# Patient Record
Sex: Female | Born: 1965 | Race: White | Hispanic: No | Smoking: Never smoker
Health system: Southern US, Community
[De-identification: ages and names within clinical notes are randomized; demographics above are authoritative.]

## PROBLEM LIST (undated history)

## (undated) DIAGNOSIS — F329 Major depressive disorder, single episode, unspecified: Secondary | ICD-10-CM

## (undated) DIAGNOSIS — F419 Anxiety disorder, unspecified: Secondary | ICD-10-CM

## (undated) DIAGNOSIS — E7800 Pure hypercholesterolemia, unspecified: Secondary | ICD-10-CM

## (undated) DIAGNOSIS — F32A Depression, unspecified: Secondary | ICD-10-CM

## (undated) DIAGNOSIS — R112 Nausea with vomiting, unspecified: Secondary | ICD-10-CM

## (undated) DIAGNOSIS — G510 Bell's palsy: Secondary | ICD-10-CM

## (undated) DIAGNOSIS — C801 Malignant (primary) neoplasm, unspecified: Secondary | ICD-10-CM

## (undated) DIAGNOSIS — Z9889 Other specified postprocedural states: Secondary | ICD-10-CM

## (undated) DIAGNOSIS — M1990 Unspecified osteoarthritis, unspecified site: Secondary | ICD-10-CM

## (undated) HISTORY — PX: TONSILLECTOMY: SUR1361

## (undated) HISTORY — PX: KNEE SURGERY: SHX244

## (undated) HISTORY — PX: FOOT SURGERY: SHX648

## (undated) HISTORY — PX: OTHER SURGICAL HISTORY: SHX169

## (undated) HISTORY — PX: AUGMENTATION MAMMAPLASTY: SUR837

---

## 2007-03-10 ENCOUNTER — Ambulatory Visit (HOSPITAL_BASED_OUTPATIENT_CLINIC_OR_DEPARTMENT_OTHER): Admission: RE | Admit: 2007-03-10 | Discharge: 2007-03-10 | Payer: Self-pay | Admitting: Orthopaedic Surgery

## 2011-01-13 ENCOUNTER — Encounter: Payer: Self-pay | Admitting: Orthopaedic Surgery

## 2011-02-15 ENCOUNTER — Other Ambulatory Visit (HOSPITAL_BASED_OUTPATIENT_CLINIC_OR_DEPARTMENT_OTHER): Payer: Self-pay | Admitting: Orthopaedic Surgery

## 2011-02-15 DIAGNOSIS — M79646 Pain in unspecified finger(s): Secondary | ICD-10-CM

## 2011-02-20 ENCOUNTER — Other Ambulatory Visit (HOSPITAL_BASED_OUTPATIENT_CLINIC_OR_DEPARTMENT_OTHER): Payer: Self-pay

## 2011-02-20 ENCOUNTER — Ambulatory Visit (INDEPENDENT_AMBULATORY_CARE_PROVIDER_SITE_OTHER)
Admission: RE | Admit: 2011-02-20 | Discharge: 2011-02-20 | Disposition: A | Discharge status: Home / Self Care | Payer: BC Managed Care – PPO | Source: Ambulatory Visit | Attending: Orthopaedic Surgery | Admitting: Orthopaedic Surgery

## 2011-02-20 ENCOUNTER — Ambulatory Visit (HOSPITAL_BASED_OUTPATIENT_CLINIC_OR_DEPARTMENT_OTHER)
Admission: RE | Admit: 2011-02-20 | Discharge: 2011-02-20 | Disposition: A | Discharge status: Home / Self Care | Payer: BC Managed Care – PPO | Source: Ambulatory Visit | Attending: Orthopaedic Surgery | Admitting: Orthopaedic Surgery

## 2011-02-20 DIAGNOSIS — M25439 Effusion, unspecified wrist: Secondary | ICD-10-CM | POA: Insufficient documentation

## 2011-02-20 DIAGNOSIS — M79646 Pain in unspecified finger(s): Secondary | ICD-10-CM

## 2011-02-20 DIAGNOSIS — M25539 Pain in unspecified wrist: Secondary | ICD-10-CM | POA: Insufficient documentation

## 2011-02-20 DIAGNOSIS — M19049 Primary osteoarthritis, unspecified hand: Secondary | ICD-10-CM | POA: Insufficient documentation

## 2011-02-20 DIAGNOSIS — M25549 Pain in joints of unspecified hand: Secondary | ICD-10-CM | POA: Insufficient documentation

## 2011-02-20 DIAGNOSIS — M79609 Pain in unspecified limb: Secondary | ICD-10-CM

## 2011-03-13 ENCOUNTER — Ambulatory Visit (HOSPITAL_BASED_OUTPATIENT_CLINIC_OR_DEPARTMENT_OTHER)
Admission: RE | Admit: 2011-03-13 | Discharge: 2011-03-13 | Disposition: A | Discharge status: Home / Self Care | Payer: BC Managed Care – PPO | Source: Ambulatory Visit | Attending: Orthopedic Surgery | Admitting: Orthopedic Surgery

## 2011-03-13 DIAGNOSIS — M65839 Other synovitis and tenosynovitis, unspecified forearm: Secondary | ICD-10-CM | POA: Insufficient documentation

## 2011-03-13 DIAGNOSIS — M19049 Primary osteoarthritis, unspecified hand: Secondary | ICD-10-CM | POA: Insufficient documentation

## 2011-03-13 DIAGNOSIS — Z01812 Encounter for preprocedural laboratory examination: Secondary | ICD-10-CM | POA: Insufficient documentation

## 2011-03-13 LAB — POCT HEMOGLOBIN-HEMACUE: Hemoglobin: 13.8 g/dL (ref 12.0–15.0)

## 2011-03-20 NOTE — Op Note (Signed)
NAMENICKOLA, LENIG                ACCOUNT NO.:  [id number]  MEDICAL RECORD NO.:  [id number]           PATIENT TYPE:  LOCATION:                                 FACILITY:  PHYSICIAN:  Artist Pais. Jora Galluzzo, M.D.DATE OF BIRTH:  Sep 04, 1966  DATE OF PROCEDURE:  03/13/2011 DATE OF DISCHARGE:                              OPERATIVE REPORT   PREOPERATIVE DIAGNOSES:  Chronic left thumb carpometacarpal arthritis, left wrist stenosing tenosynovitis.  POSTOPERATIVE DIAGNOSES:  Chronic left thumb carpometacarpal arthritis, left wrist stenosing tenosynovitis.  PROCEDURE:  Left thumb carpometacarpal arthroplasty with abductor pollicis longus tendon transfer from the wrist and left wrist de Quervain release.  SURGEON:  Artist Pais. Mina Marble, MD  ASSISTANT:  None.  ANESTHESIA:  General.  TOURNIQUET TIME:  43 minutes.  COMPLICATIONS:  None.  DRAINS:  None.  The patient was taken to the operating suite after induction of adequate general anesthesia.  Left upper extremity was prepped and draped in usual sterile fashion.  An Esmarch was used to exsanguinate the limb. Tourniquet was inflated to 150 mmHg.  At this point in time, an incision was made at the [facility] joint first dorsal compartment, left thumb, and wrist area.  Skin was incised 5-6 cm.  Dissection was carried down the level of the [facility] joint, the first dorsal compartment.  The first dorsal compartment was released at the dorsal most extent releasing the APL and EPB tendons.  These were lysed of all adhesions.  The [facility] joint was carefully identified, the snuffbox artery was identified and retracted. Once this was done, a [facility] arthrotomy was performed and a subperiosteal dissection of the [facility] joint was undertaken including the thumb metacarpal base and the trapezium.  Once this was done, the trapezium was removed in piecemeal using curettes, osteotomes, and rongeurs. Complete [facility] synovectomy was then performed.  Once this was done,  a transosseous canal was made in the thumb metacarpal base exiting in the midline of the joint, 2 cm distal and dorsal using hand awl and then sequential hand drills.  The wound was thoroughly irrigated and debrided of all bone chips.  Once this was done, the abductor pollicis longus tendon was split and the radial slip of the abductor pollicis longus tendon was transected in the musculotendinous junction, drawn into the distal wound, it was passed dorsal volar out the thumb metacarpal base, wrapped around the flexor carpi radialis tendon on the appropriate tension sutured with 2-0 FiberWire suture.  The remaining tail of the abductor pollicis longus was then sutured to itself over the thumb metacarpal base dorsally Pulvertaft weave thus creating the suspension. Both these suture points were tied with 2-0 FiberWire suture.  Once this was done, the capsule overlying the [facility] joint was closed with 4-0 Vicryl and the skin with 3-0 Prolene subcuticular stitch.  Steri-Strips, 4x4s, fluffs, and a radial gutter splint was applied.  The patient tolerated the procedure well and went to the recovery room in a stable fashion.     Artist Pais Mina Marble, M.D.     MAW/MEDQ  D:  03/13/2011  T:  03/14/2011  Job:  [id number]  Electronically  Signed by Dairl Ponder M.D. on 03/20/2011 11:28:47 AM

## 2011-05-10 NOTE — Op Note (Signed)
NAMELOREY, PALLETT                ACCOUNT NO.:  [id number]   MEDICAL RECORD NO.:  [id number]          PATIENT TYPE:  AMB   LOCATION:  DSC                          FACILITY:  [facility]   PHYSICIAN:  Claude Manges. Whitfield, M.D.DATE OF BIRTH:  01-15-1966   DATE OF PROCEDURE:  03/10/2007  DATE OF DISCHARGE:                               OPERATIVE REPORT   PREOPERATIVE DIAGNOSIS:  Possible recurrent lateral patellar pressure  syndrome, left knee with possible recurrent osteocartilaginous lesion  lateral femoral condyle.   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   PROCEDURES:  1. Diagnostic arthroscopy left knee.  2. Debridement of patella, synovectomy, and lateral release.   SURGEON:  Claude Manges. Cleophas Dunker, M.D.   ANESTHESIA:  IV sedation to general oral laryngeal.   COMPLICATIONS:  None.   HISTORY:  This 45 year old female is approximately 3 years status  Carticel implantation to a cartilaginous defect of the lateral femoral  condyle of the left knee.  She has done very very well.  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  She has tried anti-  inflammatory medicines without relief.  The pain is localized along the  lateral femoral condyle.  There is a grating sensation consistent with  either chondromalacia and or synovitis.  I have tried a cortisone  injection with some relief, but not completely relief of her pain.  She  is having significant compromise.  An MRI scan was not performed but  arthroscopy was adjusted because of the significant pain and with her  past history.   DESCRIPTION OF PROCEDURE:  With the patient comfortable on the operating  table and under IV sedation, the left lower extremity was placed in a  thigh holder; and the leg was then prepped with DuraPrep from the thigh  hold to the ankle.  A sterile draping  was performed.  The two previous  arthroscopic portals on either side of the patella tendon were then  utilized.  A 15-blade knife was used to make the small incisions.  Arthroscope was then placed in the joint medially and diagnostic  arthroscopy revealed some small cartilaginous flakes within the joint.  These were removed.  There was very minimal synovitis except along the  lateral patella; and the lateral gutter.  There was an obvious patella  tilt particularly in flexion and evidence of an old scar plica from the  previous open procedure.  The previous Carticel area of implantation  appeared to be intact.  There was some fraying of the articular  cartilage, but the lesion was otherwise okay; and there no evidence of a  recurrent defect.   The Coude shaver was then introduced; and I shaved the area of  chondromalacia, the lateral patella; and then I used the ArthroCare wand  to perform a synovectomy; and then used the hooked Bovie to perform a  repeat lateral release.  She had had one over 3 years ago, but there was  some evidence of recurrence  probably based on an open procedure through  the lateral joint with recurrent scar.  At the end of the procedure,  there was definitely an elevation of the lateral patella with some  residual tilt; but, I thought, that there was significant pressure  relief from that area.  I checked to see if there was any bleeding; and  there was none.  We had a very nice release; the gutter was clear.   I also evaluated the medial and lateral compartments without evidence of  meniscal pathology or chondromalacia.  The ACL appeared to be intact.  The two puncture sites were left open, and infiltrated with 0.25%  Marcaine with epinephrine.  A sterile bulky dressing was applied.  Percocet for pain.  Office in 1 week.      Claude Manges. Cleophas Dunker, M.D.  Electronically Signed     PWW/MEDQ  D:  03/10/2007  T:  03/10/2007  Job:  [id number]

## 2011-05-29 ENCOUNTER — Emergency Department (HOSPITAL_BASED_OUTPATIENT_CLINIC_OR_DEPARTMENT_OTHER)
Admission: EM | Admit: 2011-05-29 | Discharge: 2011-05-29 | Disposition: A | Discharge status: Home / Self Care | Payer: BC Managed Care – PPO | Attending: Emergency Medicine | Admitting: Emergency Medicine

## 2011-05-29 ENCOUNTER — Emergency Department (INDEPENDENT_AMBULATORY_CARE_PROVIDER_SITE_OTHER): Payer: BC Managed Care – PPO

## 2011-05-29 DIAGNOSIS — S51809A Unspecified open wound of unspecified forearm, initial encounter: Secondary | ICD-10-CM | POA: Insufficient documentation

## 2011-05-29 DIAGNOSIS — X58XXXA Exposure to other specified factors, initial encounter: Secondary | ICD-10-CM

## 2011-05-29 DIAGNOSIS — Y92009 Unspecified place in unspecified non-institutional (private) residence as the place of occurrence of the external cause: Secondary | ICD-10-CM | POA: Insufficient documentation

## 2011-05-29 DIAGNOSIS — W268XXA Contact with other sharp object(s), not elsewhere classified, initial encounter: Secondary | ICD-10-CM | POA: Insufficient documentation

## 2011-06-13 ENCOUNTER — Emergency Department (HOSPITAL_BASED_OUTPATIENT_CLINIC_OR_DEPARTMENT_OTHER)
Admission: EM | Admit: 2011-06-13 | Discharge: 2011-06-13 | Disposition: A | Discharge status: Home / Self Care | Payer: BC Managed Care – PPO | Attending: Emergency Medicine | Admitting: Emergency Medicine

## 2011-06-13 DIAGNOSIS — Z4802 Encounter for removal of sutures: Secondary | ICD-10-CM | POA: Insufficient documentation

## 2011-11-12 ENCOUNTER — Emergency Department (HOSPITAL_BASED_OUTPATIENT_CLINIC_OR_DEPARTMENT_OTHER)
Admission: EM | Admit: 2011-11-12 | Discharge: 2011-11-12 | Disposition: A | Discharge status: Home / Self Care | Payer: BC Managed Care – PPO | Attending: Emergency Medicine | Admitting: Emergency Medicine

## 2011-11-12 ENCOUNTER — Emergency Department (INDEPENDENT_AMBULATORY_CARE_PROVIDER_SITE_OTHER): Payer: BC Managed Care – PPO

## 2011-11-12 ENCOUNTER — Encounter: Payer: Self-pay | Admitting: Emergency Medicine

## 2011-11-12 DIAGNOSIS — M79609 Pain in unspecified limb: Secondary | ICD-10-CM

## 2011-11-12 DIAGNOSIS — W2209XA Striking against other stationary object, initial encounter: Secondary | ICD-10-CM | POA: Insufficient documentation

## 2011-11-12 DIAGNOSIS — Y92009 Unspecified place in unspecified non-institutional (private) residence as the place of occurrence of the external cause: Secondary | ICD-10-CM | POA: Insufficient documentation

## 2011-11-12 DIAGNOSIS — E7800 Pure hypercholesterolemia, unspecified: Secondary | ICD-10-CM | POA: Insufficient documentation

## 2011-11-12 DIAGNOSIS — Z79899 Other long term (current) drug therapy: Secondary | ICD-10-CM | POA: Insufficient documentation

## 2011-11-12 DIAGNOSIS — S60229A Contusion of unspecified hand, initial encounter: Secondary | ICD-10-CM | POA: Insufficient documentation

## 2011-11-12 DIAGNOSIS — X58XXXA Exposure to other specified factors, initial encounter: Secondary | ICD-10-CM

## 2011-11-12 DIAGNOSIS — S60222A Contusion of left hand, initial encounter: Secondary | ICD-10-CM

## 2011-11-12 HISTORY — DX: Pure hypercholesterolemia, unspecified: E78.00

## 2011-11-12 MED ORDER — TETANUS-DIPHTH-ACELL PERTUSSIS 5-2-15.5 LF-MCG/0.5 IM SUSP: 0.5000 mL | Freq: Once | INTRAMUSCULAR | Status: DC

## 2011-11-12 MED ORDER — TETANUS-DIPHTH-ACELL PERTUSSIS 5-2.5-18.5 LF-MCG/0.5 IM SUSP
0.5000 mL | Freq: Once | INTRAMUSCULAR | Status: AC
  Administered 2011-11-12: 0.5 mL via INTRAMUSCULAR

## 2011-11-12 MED ORDER — TETANUS-DIPHTH-ACELL PERTUSSIS 5-2.5-18.5 LF-MCG/0.5 IM SUSP
INTRAMUSCULAR | Status: AC
  Administered 2011-11-12: 0.5 mL via INTRAMUSCULAR
  Filled 2011-11-12: qty 0.5

## 2011-11-12 MED ORDER — OXYCODONE-ACETAMINOPHEN 5-325 MG PO TABS: 1.0000 | ORAL_TABLET | ORAL | Status: AC | PRN

## 2011-11-12 NOTE — ED Provider Notes (Signed)
History     CSN: [id number] Arrival date & time: 11/12/2011  8:13 AM   First MD Initiated Contact with Patient 11/12/11 0757      Chief Complaint  Patient presents with  . Hand Injury    (Consider location/radiation/quality/duration/timing/severity/associated sxs/prior treatment) Patient is a 45 y.o. female presenting with hand injury. The history is provided by the patient.  Hand Injury  The incident occurred yesterday. The incident occurred at home.   patient states that her left hand was hurt using a old a ladder yesterday afternoon. She is swelling and pain on her middle finger and thumb sides. There is some bruising. No numbness or weakness. She does not know her last tetanus shot. No other injury. She states that she does go fast she has her appointment to go to.  Past Medical History  Diagnosis Date  . Hypercholesteremia     History reviewed. No pertinent past surgical history.  History reviewed. No pertinent family history.  History  Substance Use Topics  . Smoking status: Not on file  . Smokeless tobacco: Not on file  . Alcohol Use:     OB History    Grav Para Term Preterm Abortions TAB SAB Ect Mult Living                  Review of Systems  Musculoskeletal:       Left hand injury  Skin: Positive for wound.  Neurological: Negative for weakness and numbness.  Hematological: Does not bruise/bleed easily.    Allergies  Decadron  Home Medications   Current Outpatient Rx  Name Route Sig Dispense Refill  . ATORVASTATIN CALCIUM 10 MG PO TABS Oral Take 10 mg by mouth daily.      Azzie Roup ACE-ETH ESTRAD-FE 1-20 MG-MCG PO TABS Oral Take 1 tablet by mouth daily.      . SERTRALINE HCL 50 MG PO TABS Oral Take 50 mg by mouth daily.      . OXYCODONE-ACETAMINOPHEN 5-325 MG PO TABS Oral Take 1 tablet by mouth every 4 (four) hours as needed for pain. 10 tablet 0    LMP 11/04/2011  Physical Exam  Constitutional: She appears well-developed and well-nourished.    HENT:  Head: Normocephalic.  Musculoskeletal:       Some swelling and hematoma over the hypothenar area of left hand. 3 small skin disruptions. Tender over base of fifth metacarpal on the dorsal aspect. Mild swelling over thenar area of left hand. No tenderness over bone. No tenderness over snuff box.  Skin: Skin is warm.    ED Course  Procedures (including critical care time)  Labs Reviewed - No data to display Dg Hand Complete Left  11/12/2011  *RADIOLOGY REPORT*  Clinical Data: Left hand injury.  LEFT HAND - COMPLETE 3+ VIEW  Comparison: None.  Findings: No acute fracture or dislocation identified.  Mild degenerative changes are seen including ligamentous calcification versus old avulsive fracture at the base of the first metacarpal. This is likely old based on appearance.  IMPRESSION: No acute fracture identified.  Original Report Authenticated By: Reola Calkins, M.D.     1. Contusion of left hand       MDM  Patient has an injury to her left hand. Small wounds in the skin that did not need suturing. X-ray negative she'll followup with Ortho as needed.        Juliet Rude. Rubin Payor, MD 11/12/11 [phone]

## 2011-11-12 NOTE — ED Notes (Signed)
Left hand injury occurred yesterday afternoon.  Pt has large hematoma, noted three small puncture wounds.  Bleeding controlled.  Good CMS.

## 2013-03-23 ENCOUNTER — Encounter (HOSPITAL_BASED_OUTPATIENT_CLINIC_OR_DEPARTMENT_OTHER): Payer: Self-pay | Admitting: *Deleted

## 2013-03-23 ENCOUNTER — Emergency Department (HOSPITAL_BASED_OUTPATIENT_CLINIC_OR_DEPARTMENT_OTHER)
Admission: EM | Admit: 2013-03-23 | Discharge: 2013-03-23 | Disposition: A | Discharge status: Home / Self Care | Payer: BC Managed Care – PPO | Attending: Emergency Medicine | Admitting: Emergency Medicine

## 2013-03-23 DIAGNOSIS — Z79899 Other long term (current) drug therapy: Secondary | ICD-10-CM | POA: Insufficient documentation

## 2013-03-23 DIAGNOSIS — K529 Noninfective gastroenteritis and colitis, unspecified: Secondary | ICD-10-CM

## 2013-03-23 DIAGNOSIS — R6883 Chills (without fever): Secondary | ICD-10-CM | POA: Insufficient documentation

## 2013-03-23 DIAGNOSIS — E7800 Pure hypercholesterolemia, unspecified: Secondary | ICD-10-CM | POA: Insufficient documentation

## 2013-03-23 DIAGNOSIS — K5289 Other specified noninfective gastroenteritis and colitis: Secondary | ICD-10-CM | POA: Insufficient documentation

## 2013-03-23 DIAGNOSIS — R5381 Other malaise: Secondary | ICD-10-CM | POA: Insufficient documentation

## 2013-03-23 DIAGNOSIS — Z3202 Encounter for pregnancy test, result negative: Secondary | ICD-10-CM | POA: Insufficient documentation

## 2013-03-23 DIAGNOSIS — R197 Diarrhea, unspecified: Secondary | ICD-10-CM | POA: Insufficient documentation

## 2013-03-23 LAB — COMPREHENSIVE METABOLIC PANEL
ALT: 11 U/L (ref 0–35)
AST: 24 U/L (ref 0–37)
Albumin: 3.7 g/dL (ref 3.5–5.2)
Alkaline Phosphatase: 106 U/L (ref 39–117)
Calcium: 9.4 mg/dL (ref 8.4–10.5)
Potassium: 4 mEq/L (ref 3.5–5.1)
Sodium: 136 mEq/L (ref 135–145)
Total Protein: 8.3 g/dL (ref 6.0–8.3)

## 2013-03-23 LAB — URINE MICROSCOPIC-ADD ON

## 2013-03-23 LAB — URINALYSIS, ROUTINE W REFLEX MICROSCOPIC
Ketones, ur: 15 mg/dL — AB
Nitrite: NEGATIVE
Urobilinogen, UA: 0.2 mg/dL (ref 0.0–1.0)
pH: 5 (ref 5.0–8.0)

## 2013-03-23 LAB — CBC WITH DIFFERENTIAL/PLATELET
Basophils Absolute: 0 10*3/uL (ref 0.0–0.1)
Basophils Relative: 0 % (ref 0–1)
Eosinophils Absolute: 0.5 10*3/uL (ref 0.0–0.7)
Eosinophils Relative: 4 % (ref 0–5)
Lymphs Abs: 0.4 10*3/uL — ABNORMAL LOW (ref 0.7–4.0)
MCH: 33.6 pg (ref 26.0–34.0)
MCHC: 35.7 g/dL (ref 30.0–36.0)
Neutrophils Relative %: 87 % — ABNORMAL HIGH (ref 43–77)
Platelets: 252 10*3/uL (ref 150–400)
RBC: 4.85 MIL/uL (ref 3.87–5.11)
RDW: 11.8 % (ref 11.5–15.5)

## 2013-03-23 MED ORDER — SODIUM CHLORIDE 0.9 % IV BOLUS (SEPSIS)
1000.0000 mL | Freq: Once | INTRAVENOUS | Status: AC
  Administered 2013-03-23: 1000 mL via INTRAVENOUS

## 2013-03-23 MED ORDER — KETOROLAC TROMETHAMINE 30 MG/ML IJ SOLN
30.0000 mg | Freq: Once | INTRAMUSCULAR | Status: AC
  Administered 2013-03-23: 30 mg via INTRAVENOUS
  Filled 2013-03-23: qty 1

## 2013-03-23 MED ORDER — ONDANSETRON HCL 4 MG/2ML IJ SOLN
4.0000 mg | Freq: Once | INTRAMUSCULAR | Status: AC
  Administered 2013-03-23: 4 mg via INTRAVENOUS
  Filled 2013-03-23: qty 2

## 2013-03-23 MED ORDER — PROMETHAZINE HCL 25 MG RE SUPP: 25.0000 mg | Freq: Four times a day (QID) | RECTAL | Status: DC | PRN

## 2013-03-23 MED ORDER — MORPHINE SULFATE 4 MG/ML IJ SOLN
4.0000 mg | Freq: Once | INTRAMUSCULAR | Status: AC
  Administered 2013-03-23: 4 mg via INTRAVENOUS
  Filled 2013-03-23: qty 1

## 2013-03-23 NOTE — ED Provider Notes (Signed)
History     CSN: [id number]  Arrival date & time 03/23/13  1658   First MD Initiated Contact with Patient 03/23/13 1706      Chief Complaint  Patient presents with  . Emesis    (Consider location/radiation/quality/duration/timing/severity/associated sxs/prior treatment) HPI Comments: Started this morning with severe n/v/d.  All non-bloody, non-bilious.  She denies fevers or abdominal pain but feels chilled.  Daughter recently with similar complaints.    Patient is a 47 y.o. female presenting with vomiting. The history is provided by the patient.  Emesis Severity:  Severe Duration:  24 hours Timing:  Constant Quality:  Stomach contents Progression:  Worsening Chronicity:  New Recent urination:  Normal Relieved by:  Nothing Worsened by:  Nothing tried Ineffective treatments:  None tried Associated symptoms: chills and diarrhea   Associated symptoms: no abdominal pain and no fever     Past Medical History  Diagnosis Date  . Hypercholesteremia     Past Surgical History  Procedure Laterality Date  . Knee surgery    . Foot surgery      No family history on file.  History  Substance Use Topics  . Smoking status: Never Smoker   . Smokeless tobacco: Not on file  . Alcohol Use: No    OB History   Grav Para Term Preterm Abortions TAB SAB Ect Mult Living                  Review of Systems  Constitutional: Positive for chills and fatigue. Negative for fever.  Gastrointestinal: Positive for nausea, vomiting and diarrhea. Negative for abdominal pain.  All other systems reviewed and are negative.    Allergies  Decadron  Home Medications   Current Outpatient Rx  Name  Route  Sig  Dispense  Refill  . atorvastatin (LIPITOR) 10 MG tablet   Oral   Take 10 mg by mouth daily.           . norethindrone-ethinyl estradiol (JUNEL FE,GILDESS FE,LOESTRIN FE) 1-20 MG-MCG tablet   Oral   Take 1 tablet by mouth daily.           . sertraline (ZOLOFT) 50 MG tablet  Oral   Take 50 mg by mouth daily.             BP 93/60  Pulse 89  Temp(Src) 97.8 F (36.6 C) (Oral)  Resp 20  Wt 150 lb (68.04 kg)  SpO2 97%  Physical Exam  Nursing note and vitals reviewed. Constitutional: She is oriented to person, place, and time. She appears well-developed and well-nourished. No distress.  Patient appears quite anxious.  HENT:  Head: Normocephalic and atraumatic.  Mouth/Throat: Oropharynx is clear and moist.  Neck: Normal range of motion. Neck supple.  Cardiovascular: Normal rate and regular rhythm.  Exam reveals no gallop and no friction rub.   No murmur heard. Pulmonary/Chest: Effort normal and breath sounds normal. No respiratory distress. She has no wheezes.  Abdominal: Soft. Bowel sounds are normal. She exhibits no distension. There is no tenderness.  Musculoskeletal: Normal range of motion.  Neurological: She is alert and oriented to person, place, and time.  Skin: Skin is warm and dry. She is not diaphoretic.    ED Course  Procedures (including critical care time)  Labs Reviewed  PREGNANCY, URINE  URINALYSIS, ROUTINE W REFLEX MICROSCOPIC  CBC WITH DIFFERENTIAL  COMPREHENSIVE METABOLIC PANEL   No results found.   No diagnosis found.    MDM  The presentation, exam, and  workup are all consistent with a viral gastroenteritis.  The patient is feeling better after fluids and meds and appears appropriate for discharge.  To return prn if she worsens.        Geoffery Lyons, MD 03/23/13 2011

## 2013-03-23 NOTE — ED Notes (Signed)
Vomiting and diarrhea since this am

## 2014-01-27 ENCOUNTER — Emergency Department (HOSPITAL_BASED_OUTPATIENT_CLINIC_OR_DEPARTMENT_OTHER)
Admission: EM | Admit: 2014-01-27 | Discharge: 2014-01-27 | Disposition: A | Discharge status: Home / Self Care | Payer: 59 | Attending: Emergency Medicine | Admitting: Emergency Medicine

## 2014-01-27 ENCOUNTER — Encounter (HOSPITAL_BASED_OUTPATIENT_CLINIC_OR_DEPARTMENT_OTHER): Payer: Self-pay | Admitting: Emergency Medicine

## 2014-01-27 DIAGNOSIS — E7800 Pure hypercholesterolemia, unspecified: Secondary | ICD-10-CM | POA: Insufficient documentation

## 2014-01-27 DIAGNOSIS — K529 Noninfective gastroenteritis and colitis, unspecified: Secondary | ICD-10-CM

## 2014-01-27 DIAGNOSIS — K5289 Other specified noninfective gastroenteritis and colitis: Secondary | ICD-10-CM | POA: Insufficient documentation

## 2014-01-27 DIAGNOSIS — Z3202 Encounter for pregnancy test, result negative: Secondary | ICD-10-CM | POA: Insufficient documentation

## 2014-01-27 DIAGNOSIS — Z79899 Other long term (current) drug therapy: Secondary | ICD-10-CM | POA: Insufficient documentation

## 2014-01-27 LAB — COMPREHENSIVE METABOLIC PANEL
ALK PHOS: 87 U/L (ref 39–117)
ALT: 14 U/L (ref 0–35)
AST: 20 U/L (ref 0–37)
Albumin: 3.8 g/dL (ref 3.5–5.2)
BILIRUBIN TOTAL: 0.4 mg/dL (ref 0.3–1.2)
BUN: 18 mg/dL (ref 6–23)
CHLORIDE: 104 meq/L (ref 96–112)
CO2: 22 meq/L (ref 19–32)
Calcium: 9.1 mg/dL (ref 8.4–10.5)
Creatinine, Ser: 0.9 mg/dL (ref 0.50–1.10)
GFR calc Af Amer: 86 mL/min — ABNORMAL LOW (ref >90)
GFR, EST NON AFRICAN AMERICAN: 74 mL/min — AB (ref >90)
Glucose, Bld: 91 mg/dL (ref 70–99)
POTASSIUM: 4 meq/L (ref 3.7–5.3)
SODIUM: 141 meq/L (ref 137–147)
Total Protein: 7.6 g/dL (ref 6.0–8.3)

## 2014-01-27 LAB — CBC WITH DIFFERENTIAL/PLATELET
BASOS ABS: 0 10*3/uL (ref 0.0–0.1)
Basophils Relative: 0 % (ref 0–1)
Eosinophils Absolute: 1 10*3/uL — ABNORMAL HIGH (ref 0.0–0.7)
Eosinophils Relative: 9 % — ABNORMAL HIGH (ref 0–5)
HEMATOCRIT: 43.5 % (ref 36.0–46.0)
Hemoglobin: 15.3 g/dL — ABNORMAL HIGH (ref 12.0–15.0)
LYMPHS PCT: 6 % — AB (ref 12–46)
Lymphs Abs: 0.6 10*3/uL — ABNORMAL LOW (ref 0.7–4.0)
MCH: 34.5 pg — ABNORMAL HIGH (ref 26.0–34.0)
MCHC: 35.2 g/dL (ref 30.0–36.0)
MCV: 98.2 fL (ref 78.0–100.0)
MONO ABS: 0.8 10*3/uL (ref 0.1–1.0)
Monocytes Relative: 7 % (ref 3–12)
NEUTROS ABS: 8.2 10*3/uL — AB (ref 1.7–7.7)
Neutrophils Relative %: 77 % (ref 43–77)
PLATELETS: 256 10*3/uL (ref 150–400)
RBC: 4.43 MIL/uL (ref 3.87–5.11)
RDW: 11 % — AB (ref 11.5–15.5)
WBC: 10.6 10*3/uL — AB (ref 4.0–10.5)

## 2014-01-27 LAB — URINALYSIS, ROUTINE W REFLEX MICROSCOPIC
Bilirubin Urine: NEGATIVE
Glucose, UA: NEGATIVE mg/dL
Ketones, ur: NEGATIVE mg/dL
Leukocytes, UA: NEGATIVE
Nitrite: NEGATIVE
Protein, ur: NEGATIVE mg/dL
SPECIFIC GRAVITY, URINE: 1.03 (ref 1.005–1.030)
UROBILINOGEN UA: 0.2 mg/dL (ref 0.0–1.0)
pH: 5.5 (ref 5.0–8.0)

## 2014-01-27 LAB — URINE MICROSCOPIC-ADD ON

## 2014-01-27 LAB — PREGNANCY, URINE: PREG TEST UR: NEGATIVE

## 2014-01-27 MED ORDER — SODIUM CHLORIDE 0.9 % IV BOLUS (SEPSIS)
1000.0000 mL | Freq: Once | INTRAVENOUS | Status: AC
  Administered 2014-01-27: 1000 mL via INTRAVENOUS

## 2014-01-27 MED ORDER — ONDANSETRON HCL 4 MG PO TABS: 4.0000 mg | ORAL_TABLET | Freq: Three times a day (TID) | ORAL | Status: DC | PRN

## 2014-01-27 MED ORDER — OXYCODONE-ACETAMINOPHEN 5-325 MG PO TABS
2.0000 | ORAL_TABLET | Freq: Once | ORAL | Status: AC
  Administered 2014-01-27: 2 via ORAL
  Filled 2014-01-27: qty 2

## 2014-01-27 MED ORDER — ONDANSETRON HCL 4 MG/2ML IJ SOLN
4.0000 mg | Freq: Once | INTRAMUSCULAR | Status: AC
  Administered 2014-01-27: 4 mg via INTRAVENOUS
  Filled 2014-01-27: qty 2

## 2014-01-27 MED ORDER — DICYCLOMINE HCL 10 MG PO CAPS
10.0000 mg | ORAL_CAPSULE | Freq: Once | ORAL | Status: AC
  Administered 2014-01-27: 10 mg via ORAL
  Filled 2014-01-27: qty 1

## 2014-01-27 MED ORDER — LOPERAMIDE HCL 2 MG PO CAPS
4.0000 mg | ORAL_CAPSULE | Freq: Once | ORAL | Status: AC
  Administered 2014-01-27: 4 mg via ORAL
  Filled 2014-01-27: qty 2

## 2014-01-27 NOTE — ED Notes (Signed)
Pt c/o n/v/d x 3 hrs

## 2014-01-27 NOTE — ED Provider Notes (Signed)
CSN: [id number]     Arrival date & time 01/27/14  2103 History  This chart was scribed for Charles B. Karle Starch, MD by Roe Coombs, ED Scribe. The patient was seen in room MH08/MH08. Patient's care was started at 9:18 PM.    Chief Complaint  Patient presents with  . Emesis    The history is provided by the patient. No language interpreter was used.    HPI Comments: [name] [name] is a 48 y.o. female with history of hypercholesterolemia who presents to the Emergency Department complaining of continuous episodes of non-bloody emesis of stomach contents and non-bloody diarrhea with associated nausea and upper abdominal pain onset 3 hours ago. She states that her 61 year old daughter has been sick with viral gastroenteritis for the past four days and was seen here last night for this. She has no abdominal surgical history. Patient does not smoke.   Past Medical History  Diagnosis Date  . Hypercholesteremia    Past Surgical History  Procedure Laterality Date  . Knee surgery    . Foot surgery     History reviewed. No pertinent family history. History  Substance Use Topics  . Smoking status: Never Smoker   . Smokeless tobacco: Not on file  . Alcohol Use: No   OB History   Grav Para Term Preterm Abortions TAB SAB Ect Mult Living                 Review of Systems A complete 10 system review of systems was obtained and all systems are negative except as noted in the HPI and PMH.    Allergies  Decadron  Home Medications   Current Outpatient Rx  Name  Route  Sig  Dispense  Refill  . atorvastatin (LIPITOR) 10 MG tablet   Oral   Take 10 mg by mouth daily.           . norethindrone-ethinyl estradiol (JUNEL FE,GILDESS FE,LOESTRIN FE) 1-20 MG-MCG tablet   Oral   Take 1 tablet by mouth daily.           . promethazine (PHENERGAN) 25 MG suppository   Rectal   Place 1 suppository (25 mg total) rectally every 6 (six) hours as needed for nausea.   12 each   0   . sertraline  (ZOLOFT) 50 MG tablet   Oral   Take 50 mg by mouth daily.            Triage Vitals: BP 101/67  Pulse 89  Temp(Src) 98.5 F (36.9 C)  Resp 16  Ht 5\' 5"  (1.651 m)  Wt 145 lb (65.772 kg)  BMI 24.13 kg/m2  SpO2 98% Physical Exam  Nursing note and vitals reviewed. Constitutional: She is oriented to person, place, and time. She appears well-developed and well-nourished.  HENT:  Head: Normocephalic and atraumatic.  Eyes: EOM are normal. Pupils are equal, round, and reactive to light.  Neck: Normal range of motion. Neck supple.  Cardiovascular: Normal rate, normal heart sounds and intact distal pulses.   Pulmonary/Chest: Effort normal and breath sounds normal.  Abdominal: Bowel sounds are normal. She exhibits no distension. There is no tenderness.  Musculoskeletal: Normal range of motion. She exhibits no edema and no tenderness.  Neurological: She is alert and oriented to person, place, and time. She has normal strength. No cranial nerve deficit or sensory deficit.  Skin: Skin is warm and dry. No rash noted.  Psychiatric: She has a normal mood and affect.    ED Course  Procedures (including critical care time) DIAGNOSTIC STUDIES: Oxygen Saturation is 98% on room air, normal by my interpretation.    COORDINATION OF CARE: 9:27 PM- Patient informed of current plan for treatment and evaluation and agrees with plan at this time.     Labs Review Labs Reviewed  URINALYSIS, ROUTINE W REFLEX MICROSCOPIC - Abnormal; Notable for the following:    APPearance CLOUDY (*)    Hgb urine dipstick LARGE (*)    All other components within normal limits  CBC WITH DIFFERENTIAL - Abnormal; Notable for the following:    WBC 10.6 (*)    Hemoglobin 15.3 (*)    MCH 34.5 (*)    RDW 11.0 (*)    Neutro Abs 8.2 (*)    Lymphocytes Relative 6 (*)    Lymphs Abs 0.6 (*)    Eosinophils Relative 9 (*)    Eosinophils Absolute 1.0 (*)    All other components within normal limits  COMPREHENSIVE METABOLIC  PANEL - Abnormal; Notable for the following:    GFR calc non Af Amer 74 (*)    GFR calc Af Amer 86 (*)    All other components within normal limits  URINE MICROSCOPIC-ADD ON - Abnormal; Notable for the following:    Squamous Epithelial / LPF FEW (*)    All other components within normal limits  PREGNANCY, URINE    Imaging Review No results found.  EKG Interpretation   None       MDM   1. Gastroenteritis     Labs reviewed and unremarkable. Pt continues to have some diarrhea but no further vomiting. Given pain meds, bentyl and imodium in the ED as well as second liter IVF.   I personally performed the services described in this documentation, which was scribed in my presence. The recorded information has been reviewed and is accurate.      Charles B. Karle Starch, MD 01/27/14 2259

## 2014-01-27 NOTE — Discharge Instructions (Signed)
Viral Gastroenteritis Viral gastroenteritis is also known as stomach flu. This condition affects the stomach and intestinal tract. It can cause sudden diarrhea and vomiting. The illness typically lasts 3 to 8 days. Most people develop an immune response that eventually gets rid of the virus. While this natural response develops, the virus can make you quite ill. CAUSES  Many different viruses can cause gastroenteritis, such as rotavirus or noroviruses. You can catch one of these viruses by consuming contaminated food or water. You may also catch a virus by sharing utensils or other personal items with an infected person or by touching a contaminated surface. SYMPTOMS  The most common symptoms are diarrhea and vomiting. These problems can cause a severe loss of body fluids (dehydration) and a body salt (electrolyte) imbalance. Other symptoms may include:  Fever.  Headache.  Fatigue.  Abdominal pain. DIAGNOSIS  Your caregiver can usually diagnose viral gastroenteritis based on your symptoms and a physical exam. A stool sample may also be taken to test for the presence of viruses or other infections. TREATMENT  This illness typically goes away on its own. Treatments are aimed at rehydration. The most serious cases of viral gastroenteritis involve vomiting so severely that you are not able to keep fluids down. In these cases, fluids must be given through an intravenous line (IV). HOME CARE INSTRUCTIONS   Drink enough fluids to keep your urine clear or pale yellow. Drink small amounts of fluids frequently and increase the amounts as tolerated.  Ask your caregiver for specific rehydration instructions.  Avoid:  Foods high in sugar.  Alcohol.  Carbonated drinks.  Tobacco.  Juice.  Caffeine drinks.  Extremely hot or cold fluids.  Fatty, greasy foods.  Too much intake of anything at one time.  Dairy products until 24 to 48 hours after diarrhea stops.  You may consume probiotics.  Probiotics are active cultures of beneficial bacteria. They may lessen the amount and number of diarrheal stools in adults. Probiotics can be found in yogurt with active cultures and in supplements.  Wash your hands well to avoid spreading the virus.  Only take over-the-counter or prescription medicines for pain, discomfort, or fever as directed by your caregiver. Do not give aspirin to children. Antidiarrheal medicines are not recommended.  Ask your caregiver if you should continue to take your regular prescribed and over-the-counter medicines.  Keep all follow-up appointments as directed by your caregiver. SEEK IMMEDIATE MEDICAL CARE IF:   You are unable to keep fluids down.  You do not urinate at least once every 6 to 8 hours.  You develop shortness of breath.  You notice blood in your stool or vomit. This may look like coffee grounds.  You have abdominal pain that increases or is concentrated in one small area (localized).  You have persistent vomiting or diarrhea.  You have a fever.  The patient is a child younger than 3 months, and he or she has a fever.  The patient is a child older than 3 months, and he or she has a fever and persistent symptoms.  The patient is a child older than 3 months, and he or she has a fever and symptoms suddenly get worse.  The patient is a baby, and he or she has no tears when crying. MAKE SURE YOU:   Understand these instructions.  Will watch your condition.  Will get help right away if you are not doing well or get worse. Document Released: 12/09/2005 Document Revised: 03/02/2012 Document Reviewed: 09/25/2011   [employer] Patient Information 2014 [employer]  

## 2014-09-16 NOTE — H&P (Signed)
[name] Fears, MD   Biagio Borg, PA-C [address], [place], [place]  [address]                             [phone]   ORTHOPAEDIC HISTORY & PHYSICAL  [name] [name] MRN:  [id number] DOB/SEX:  September 30, 1966/female  CHIEF COMPLAINT:  Painful right heel pain   HISTORY:[name] [name] has been having persistent problems with plantar fasciitis of her right heel.  [name] [name] has had a successful release of the plantar fascia on her left heel, although it took a long time to heal.  [name] [name] has been working out in an attempt to lose weight and get into "getter shape" and notes that [name] [name] has been having more pain in the  plantar aspect of her right heel.  [name] [name] did have an injection in that area about 2 months ago.  [name] [name] notes [name] [name] has gained some weight over time and is in the midst of trying to lose that    PAST MEDICAL HISTORY: There are no active problems to display for this patient.  Past Medical History  Diagnosis Date  . Hypercholesteremia   . Depression   . PONV (postoperative nausea and vomiting)     would like scope patch   Past Surgical History  Procedure Laterality Date  . Knee surgery    . Foot surgery    . Tonsillectomy    . Tummy tuck      2010     MEDICATIONS:   Prescriptions prior to admission  Medication Sig Dispense Refill  . atorvastatin (LIPITOR) 10 MG tablet Take 10 mg by mouth daily.        Marland Kitchen ibuprofen (ADVIL,MOTRIN) 800 MG tablet Take 800 mg by mouth every 8 (eight) hours as needed.      . sertraline (ZOLOFT) 50 MG tablet Take 50 mg by mouth daily.         ALLERGIES:   Allergies  Allergen Reactions  . Decadron [Dexamethasone Sodium Phosphate] Anaphylaxis    REVIEW OF SYSTEMS:  A comprehensive review of systems was negative except for: Behavioral/Psych: positive for depression   FAMILY HISTORY:  History reviewed. No pertinent family history.  SOCIAL HISTORY:   History  Substance Use Topics  . Smoking status: Never Smoker   . Smokeless tobacco: Not on file   . Alcohol Use: No      EXAMINATION: Vital signs in last 24 hours: Temp:  [98.6 F (37 C)] 98.6 F (37 C) (10/06 1219) Pulse Rate:  [53] 53 (10/06 1219) Resp:  [18] 18 (10/06 1219) BP: (110)/(62) 110/62 mmHg (10/06 1219) SpO2:  [99 %] 99 % (10/06 1219) Weight:  [63.504 kg (140 lb)] 63.504 kg (140 lb) (10/06 1219)  Head is normocephalic.   Eyes:  Pupils equal, round and reactive to light and accommodation.  Extraocular intact. ENT: Ears, nose, and throat were benign.   Neck: supple, no bruits were noted.   Chest: good expansion.   Lungs: essentially clear.   Cardiac: regular rhythm and rate, normal S1, S2.  No murmurs appreciated. Pulses :  2+ bilateral and symmetric in lower extremities. Abdomen is scaphoid, soft, nontender, no masses palpable, normal bowel sounds present. CNS:  [name] [name] is oriented x3 and cranial nerves II-XII grossly intact. Breast, rectal, and genital exams: not performed and not indicated for an orthopedic evaluation. Musculoskeletal: [name] [name] was tender directly over the plantar fascial attachment at the medial aspect of her right heel.  [name] [name] does have a little bit of a limp   Imaging Review X-rays today, 2-view, of the right calcaneus reveals a plantar calcaneal spur.  [name] [name] also has a little bit of spurring over the talar neck, which may cause some impingement to the tibia.      ASSESSMENT: right plantar faciitis  Past Medical History  Diagnosis Date  . Hypercholesteremia   . Depression   . PONV (postoperative nausea and vomiting)     would like scope patch    PLAN: Plan for right plantar fascial release  The procedure,  risks, and benefits of surgery were presented and reviewed. The risks including but not limited to infection, blood clots, vascular and nerve injury, stiffness,  among others were discussed. The patient acknowledged the explanation, agreed to proceed.   Mike Craze [place], [place] [phone]  09/27/2014 12:32  PM

## 2014-09-22 ENCOUNTER — Encounter (HOSPITAL_BASED_OUTPATIENT_CLINIC_OR_DEPARTMENT_OTHER): Payer: Self-pay | Admitting: *Deleted

## 2014-09-27 ENCOUNTER — Encounter (HOSPITAL_BASED_OUTPATIENT_CLINIC_OR_DEPARTMENT_OTHER)
Admission: RE | Disposition: A | Discharge status: Home / Self Care | Payer: Self-pay | Source: Ambulatory Visit | Attending: Orthopaedic Surgery

## 2014-09-27 ENCOUNTER — Ambulatory Visit (HOSPITAL_BASED_OUTPATIENT_CLINIC_OR_DEPARTMENT_OTHER)
Admission: RE | Admit: 2014-09-27 | Discharge: 2014-09-27 | Disposition: A | Discharge status: Home / Self Care | Payer: 59 | Source: Ambulatory Visit | Attending: Orthopaedic Surgery | Admitting: Orthopaedic Surgery

## 2014-09-27 ENCOUNTER — Encounter (HOSPITAL_BASED_OUTPATIENT_CLINIC_OR_DEPARTMENT_OTHER): Payer: Self-pay | Admitting: Certified Registered"

## 2014-09-27 ENCOUNTER — Ambulatory Visit (HOSPITAL_BASED_OUTPATIENT_CLINIC_OR_DEPARTMENT_OTHER): Payer: 59 | Admitting: Certified Registered"

## 2014-09-27 ENCOUNTER — Encounter (HOSPITAL_BASED_OUTPATIENT_CLINIC_OR_DEPARTMENT_OTHER): Payer: 59 | Admitting: Certified Registered"

## 2014-09-27 DIAGNOSIS — F329 Major depressive disorder, single episode, unspecified: Secondary | ICD-10-CM | POA: Diagnosis not present

## 2014-09-27 DIAGNOSIS — Z888 Allergy status to other drugs, medicaments and biological substances status: Secondary | ICD-10-CM | POA: Insufficient documentation

## 2014-09-27 DIAGNOSIS — E780 Pure hypercholesterolemia: Secondary | ICD-10-CM | POA: Insufficient documentation

## 2014-09-27 DIAGNOSIS — Z79899 Other long term (current) drug therapy: Secondary | ICD-10-CM | POA: Diagnosis not present

## 2014-09-27 DIAGNOSIS — M25774 Osteophyte, right foot: Secondary | ICD-10-CM | POA: Diagnosis not present

## 2014-09-27 DIAGNOSIS — M722 Plantar fascial fibromatosis: Secondary | ICD-10-CM

## 2014-09-27 HISTORY — DX: Major depressive disorder, single episode, unspecified: F32.9

## 2014-09-27 HISTORY — DX: Depression, unspecified: F32.A

## 2014-09-27 HISTORY — PX: PLANTAR FASCIA RELEASE: SHX2239

## 2014-09-27 HISTORY — DX: Nausea with vomiting, unspecified: R11.2

## 2014-09-27 HISTORY — DX: Other specified postprocedural states: Z98.890

## 2014-09-27 LAB — POCT HEMOGLOBIN-HEMACUE: Hemoglobin: 14.4 g/dL (ref 12.0–15.0)

## 2014-09-27 SURGERY — RELEASE, FASCIA, PLANTAR
Anesthesia: General | Site: Foot | Laterality: Right

## 2014-09-27 MED ORDER — OXYCODONE HCL 5 MG PO TABS
5.0000 mg | ORAL_TABLET | Freq: Once | ORAL | Status: AC | PRN
Start: 2014-09-27 — End: 2014-09-27
  Administered 2014-09-27: 5 mg via ORAL

## 2014-09-27 MED ORDER — BUPIVACAINE-EPINEPHRINE (PF) 0.25% -1:200000 IJ SOLN
INTRAMUSCULAR | Status: AC
  Filled 2014-09-27: qty 30

## 2014-09-27 MED ORDER — SCOPOLAMINE 1 MG/3DAYS TD PT72
MEDICATED_PATCH | TRANSDERMAL | Status: AC
  Filled 2014-09-27: qty 1

## 2014-09-27 MED ORDER — LIDOCAINE HCL (CARDIAC) 20 MG/ML IV SOLN
INTRAVENOUS | Status: DC | PRN
  Administered 2014-09-27: 60 mg via INTRAVENOUS

## 2014-09-27 MED ORDER — MIDAZOLAM HCL 2 MG/2ML IJ SOLN
INTRAMUSCULAR | Status: AC
  Filled 2014-09-27: qty 2

## 2014-09-27 MED ORDER — CHLORHEXIDINE GLUCONATE 4 % EX LIQD: 60.0000 mL | Freq: Once | CUTANEOUS | Status: DC

## 2014-09-27 MED ORDER — OXYCODONE HCL 5 MG/5ML PO SOLN: 5.0000 mg | Freq: Once | ORAL | Status: AC | PRN

## 2014-09-27 MED ORDER — LACTATED RINGERS IV SOLN
INTRAVENOUS | Status: DC
  Administered 2014-09-27 (×2): via INTRAVENOUS

## 2014-09-27 MED ORDER — ACETAMINOPHEN 10 MG/ML IV SOLN
1000.0000 mg | Freq: Four times a day (QID) | INTRAVENOUS | Status: DC
  Administered 2014-09-27: 1000 mg via INTRAVENOUS

## 2014-09-27 MED ORDER — SCOPOLAMINE 1 MG/3DAYS TD PT72: 1.0000 | MEDICATED_PATCH | TRANSDERMAL | Status: DC

## 2014-09-27 MED ORDER — HYDROMORPHONE HCL 1 MG/ML IJ SOLN
INTRAMUSCULAR | Status: AC
  Filled 2014-09-27: qty 1

## 2014-09-27 MED ORDER — MIDAZOLAM HCL 2 MG/2ML IJ SOLN: 1.0000 mg | INTRAMUSCULAR | Status: DC | PRN

## 2014-09-27 MED ORDER — PROPOFOL 10 MG/ML IV BOLUS
INTRAVENOUS | Status: DC | PRN
  Administered 2014-09-27: 200 mg via INTRAVENOUS

## 2014-09-27 MED ORDER — ACETAMINOPHEN 10 MG/ML IV SOLN
INTRAVENOUS | Status: AC
  Filled 2014-09-27: qty 100

## 2014-09-27 MED ORDER — OXYCODONE HCL 5 MG PO TABS
ORAL_TABLET | ORAL | Status: AC
  Filled 2014-09-27: qty 1

## 2014-09-27 MED ORDER — CEFAZOLIN SODIUM-DEXTROSE 2-3 GM-% IV SOLR
INTRAVENOUS | Status: AC
  Filled 2014-09-27: qty 50

## 2014-09-27 MED ORDER — FENTANYL CITRATE 0.05 MG/ML IJ SOLN
INTRAMUSCULAR | Status: DC | PRN
  Administered 2014-09-27: 25 ug via INTRAVENOUS
  Administered 2014-09-27: 50 ug via INTRAVENOUS
  Administered 2014-09-27: 25 ug via INTRAVENOUS

## 2014-09-27 MED ORDER — MIDAZOLAM HCL 5 MG/5ML IJ SOLN
INTRAMUSCULAR | Status: DC | PRN
  Administered 2014-09-27: 2 mg via INTRAVENOUS

## 2014-09-27 MED ORDER — ONDANSETRON HCL 4 MG/2ML IJ SOLN
INTRAMUSCULAR | Status: DC | PRN
  Administered 2014-09-27: 4 mg via INTRAVENOUS

## 2014-09-27 MED ORDER — FENTANYL CITRATE 0.05 MG/ML IJ SOLN: 50.0000 ug | INTRAMUSCULAR | Status: DC | PRN

## 2014-09-27 MED ORDER — CEFAZOLIN SODIUM-DEXTROSE 2-3 GM-% IV SOLR
2.0000 g | INTRAVENOUS | Status: AC
  Administered 2014-09-27: 2 g via INTRAVENOUS

## 2014-09-27 MED ORDER — FENTANYL CITRATE 0.05 MG/ML IJ SOLN
INTRAMUSCULAR | Status: AC
  Filled 2014-09-27: qty 6

## 2014-09-27 MED ORDER — SODIUM CHLORIDE 0.9 % IV SOLN: INTRAVENOUS | Status: DC

## 2014-09-27 MED ORDER — PROMETHAZINE HCL 25 MG/ML IJ SOLN: 6.2500 mg | INTRAMUSCULAR | Status: DC | PRN

## 2014-09-27 MED ORDER — EPHEDRINE SULFATE 50 MG/ML IJ SOLN
INTRAMUSCULAR | Status: DC | PRN
  Administered 2014-09-27: 10 mg via INTRAVENOUS

## 2014-09-27 MED ORDER — OXYCODONE-ACETAMINOPHEN 5-325 MG PO TABS: 1.0000 | ORAL_TABLET | ORAL | Status: DC | PRN

## 2014-09-27 MED ORDER — BUPIVACAINE-EPINEPHRINE 0.25% -1:200000 IJ SOLN
INTRAMUSCULAR | Status: DC | PRN
  Administered 2014-09-27: 10 mL
  Administered 2014-09-27: 8 mL

## 2014-09-27 MED ORDER — HYDROMORPHONE HCL 1 MG/ML IJ SOLN
0.2500 mg | INTRAMUSCULAR | Status: DC | PRN
  Administered 2014-09-27 (×4): 0.5 mg via INTRAVENOUS

## 2014-09-27 SURGICAL SUPPLY — 65 items
APL SKNCLS STERI-STRIP NONHPOA (GAUZE/BANDAGES/DRESSINGS) ×1
BAG DECANTER FOR FLEXI CONT (MISCELLANEOUS) ×1 IMPLANT
BANDAGE ELASTIC 4 VELCRO ST LF (GAUZE/BANDAGES/DRESSINGS) ×2 IMPLANT
BANDAGE ELASTIC 6 VELCRO ST LF (GAUZE/BANDAGES/DRESSINGS) ×3 IMPLANT
BENZOIN TINCTURE PRP APPL 2/3 (GAUZE/BANDAGES/DRESSINGS) ×2 IMPLANT
BLADE SURG 15 STRL LF DISP TIS (BLADE) ×1 IMPLANT
BLADE SURG 15 STRL SS (BLADE) ×3
BNDG CMPR 9X4 STRL LF SNTH (GAUZE/BANDAGES/DRESSINGS) ×1
BNDG COHESIVE 3X5 TAN STRL LF (GAUZE/BANDAGES/DRESSINGS) IMPLANT
BNDG ESMARK 4X9 LF (GAUZE/BANDAGES/DRESSINGS) ×3 IMPLANT
BNDG GAUZE ELAST 4 BULKY (GAUZE/BANDAGES/DRESSINGS) ×6 IMPLANT
CLOSURE WOUND 1/2 X4 (GAUZE/BANDAGES/DRESSINGS) ×1
COVER MAYO STAND STRL (DRAPES) ×3 IMPLANT
COVER TABLE BACK 60X90 (DRAPES) ×3 IMPLANT
DRAPE EXTREMITY TIBURON (DRAPES) ×3 IMPLANT
DRAPE INCISE IOBAN 66X45 STRL (DRAPES) IMPLANT
DRAPE OEC MINIVIEW 54X84 (DRAPES) ×1 IMPLANT
DRAPE U 20/CS (DRAPES) IMPLANT
DRSG EMULSION OIL 3X3 NADH (GAUZE/BANDAGES/DRESSINGS) ×1 IMPLANT
DRSG PAD ABDOMINAL 8X10 ST (GAUZE/BANDAGES/DRESSINGS) IMPLANT
DURAPREP 26ML APPLICATOR (WOUND CARE) ×3 IMPLANT
GAUZE SPONGE 4X4 12PLY STRL (GAUZE/BANDAGES/DRESSINGS) ×2 IMPLANT
GAUZE XEROFORM 1X8 LF (GAUZE/BANDAGES/DRESSINGS) ×2 IMPLANT
GLOVE BIO SURGEON STRL SZ7.5 (GLOVE) IMPLANT
GLOVE BIOGEL PI IND STRL 7.0 (GLOVE) IMPLANT
GLOVE BIOGEL PI IND STRL 8 (GLOVE) ×1 IMPLANT
GLOVE BIOGEL PI IND STRL 8.5 (GLOVE) IMPLANT
GLOVE BIOGEL PI INDICATOR 7.0 (GLOVE) ×2
GLOVE BIOGEL PI INDICATOR 8 (GLOVE) ×2
GLOVE BIOGEL PI INDICATOR 8.5 (GLOVE) ×2
GLOVE ECLIPSE 6.5 STRL STRAW (GLOVE) ×2 IMPLANT
GLOVE ECLIPSE 8.0 STRL XLNG CF (GLOVE) ×3 IMPLANT
GOWN STRL REUS W/ TWL LRG LVL3 (GOWN DISPOSABLE) ×1 IMPLANT
GOWN STRL REUS W/ TWL XL LVL3 (GOWN DISPOSABLE) ×1 IMPLANT
GOWN STRL REUS W/TWL LRG LVL3 (GOWN DISPOSABLE) ×6
GOWN STRL REUS W/TWL XL LVL3 (GOWN DISPOSABLE) ×3
NEEDLE HYPO 22GX1.5 SAFETY (NEEDLE) ×3 IMPLANT
NS IRRIG 1000ML POUR BTL (IV SOLUTION) ×2 IMPLANT
PACK BASIN DAY SURGERY FS (CUSTOM PROCEDURE TRAY) ×3 IMPLANT
PAD CAST 4YDX4 CTTN HI CHSV (CAST SUPPLIES) ×2 IMPLANT
PADDING CAST ABS 4INX4YD NS (CAST SUPPLIES)
PADDING CAST ABS COTTON 4X4 ST (CAST SUPPLIES) IMPLANT
PADDING CAST COTTON 4X4 STRL (CAST SUPPLIES) ×6
PENCIL BUTTON HOLSTER BLD 10FT (ELECTRODE) ×3 IMPLANT
SHEET MEDIUM DRAPE 40X70 STRL (DRAPES) ×3 IMPLANT
SPLINT FAST PLASTER 5X30 (CAST SUPPLIES) ×30
SPLINT PLASTER CAST FAST 5X30 (CAST SUPPLIES) IMPLANT
SPONGE LAP 4X18 X RAY DECT (DISPOSABLE) IMPLANT
STAPLER VISISTAT 35W (STAPLE) IMPLANT
STOCKINETTE 6  STRL (DRAPES) ×2
STOCKINETTE 6 STRL (DRAPES) ×1 IMPLANT
STRIP CLOSURE SKIN 1/2X4 (GAUZE/BANDAGES/DRESSINGS) ×1 IMPLANT
SUCTION FRAZIER TIP 10 FR DISP (SUCTIONS) ×1 IMPLANT
SUT ETHILON 4 0 PS 2 18 (SUTURE) ×2 IMPLANT
SUT MNCRL AB 3-0 PS2 18 (SUTURE) ×2 IMPLANT
SUT PROLENE 3 0 PS 2 (SUTURE) IMPLANT
SUT VIC AB 2-0 SH 27 (SUTURE)
SUT VIC AB 2-0 SH 27XBRD (SUTURE) IMPLANT
SUT VICRYL 4-0 PS2 18IN ABS (SUTURE) IMPLANT
SYR 20CC LL (SYRINGE) ×1 IMPLANT
SYR BULB 3OZ (MISCELLANEOUS) ×2 IMPLANT
SYR CONTROL 10ML LL (SYRINGE) ×2 IMPLANT
TUBE CONNECTING 20'X1/4 (TUBING)
TUBE CONNECTING 20X1/4 (TUBING) ×1 IMPLANT
UNDERPAD 30X30 INCONTINENT (UNDERPADS AND DIAPERS) ×1 IMPLANT

## 2014-09-27 NOTE — Transfer of Care (Signed)
Immediate Anesthesia Transfer of Care Note  Patient: [name] [name]  Procedure(s) Performed: Procedure(s): PLANTAR FASCIA RELEASE (Right)  Patient Location: PACU  Anesthesia Type:General  Level of Consciousness: awake and patient cooperative  Airway & Oxygen Therapy: Patient Spontanous Breathing and Patient connected to face mask oxygen  Post-op Assessment: Report given to PACU RN and Post -op Vital signs reviewed and stable  Post vital signs: Reviewed and stable  Complications: No apparent anesthesia complications

## 2014-09-27 NOTE — H&P (Signed)
  The recent History & Physical has been reviewed. I have personally examined the patient today. There is no interval change to the documented History & Physical. The patient would like to proceed with the procedure.  [name] W 09/27/2014,  1:06 PM

## 2014-09-27 NOTE — Discharge Instructions (Addendum)
Call your surgeon if you experience:  °1.  Fever over 101.0. °2.  Inability to urinate. °3.  Nausea and/or vomiting. °4.  Extreme swelling or bruising at the surgical site. °5.  Continued bleeding from the incision. °6.  Increased pain, redness or drainage from the incision. °7.  Problems related to your pain medication. °8. Any change in color, movement and/or sensation °9. Any problems and/or concerns ° ° °Post Anesthesia Home Care Instructions °Activity: °Get plenty of rest for the remainder of the day. A responsible adult should stay with you for 24 hours following the procedure.  °For the next 24 hours, DO NOT: °-Drive a car °-Operate machinery °-Drink alcoholic beverages °-Take any medication unless instructed by your physician °-Make any legal decisions or sign important papers. ° °Meals: °Start with liquid foods such as gelatin or soup. Progress to regular foods as tolerated. Avoid greasy, spicy, heavy foods. If nausea and/or vomiting occur, drink only clear liquids until the nausea and/or vomiting subsides. Call your physician if vomiting continues. ° °Special Instructions/Symptoms: °Your throat may feel dry or sore from the anesthesia or the breathing tube placed in your throat during surgery. If this causes discomfort, gargle with warm salt water. The discomfort should disappear within 24 hours. ° °

## 2014-09-27 NOTE — Anesthesia Postprocedure Evaluation (Signed)
Anesthesia Post Note  Patient: [name] [name]  Procedure(s) Performed: Procedure(s) (LRB): PLANTAR FASCIA RELEASE (Right)  Anesthesia type: general  Patient location: PACU  Post pain: Pain level controlled  Post assessment: Patient's Cardiovascular Status Stable  Last Vitals:  Filed Vitals:   09/27/14 1530  BP: 110/69  Pulse: 65  Temp:   Resp: 13    Post vital signs: Reviewed and stable  Level of consciousness: sedated  Complications: No apparent anesthesia complications

## 2014-09-27 NOTE — Op Note (Signed)
PATIENT ID:      Kamron Portee  MRN:     [id number] DOB/AGE:    April 17, 1966 / 48 y.o.       OPERATIVE REPORT    DATE OF PROCEDURE:  09/27/2014       PREOPERATIVE DIAGNOSIS:   RIGHT PLANTAR FASCIITIS                                                                   Estimated body mass index is 23.3 kg/(m^2) as calculated from the following:   Height as of this encounter: 5\' 5"  (1.651 m).   Weight as of this encounter: 63.504 kg (140 lb).   POST OPERATIVE DIAGNOSIS-SAME  PROCEDURE:  Procedure(s): PLANTAR FASCIA RELEASE right     SURGEON:  [name], MD    ASSISTANT:   Biagio Borg, PA-C   (Present and scrubbed throughout the case, critical for assistance with exposure, retraction, instrumentation, and closure.)          ANESTHESIA: general     DRAINS: none :      TOURNIQUET TIME:     COMPLICATIONS:  None   CONDITION:  stable  PROCEDURE IN [id number]    Durward Fortes, [place] W 09/27/2014, 2:13 PM

## 2014-09-27 NOTE — Anesthesia Procedure Notes (Signed)
Procedure Name: LMA Insertion Date/Time: 09/27/2014 1:25 PM Performed by: Jaylin Benzel Pre-anesthesia Checklist: Patient identified, Emergency Drugs available, Suction available and Patient being monitored Patient Re-evaluated:Patient Re-evaluated prior to inductionOxygen Delivery Method: Circle System Utilized Preoxygenation: Pre-oxygenation with 100% oxygen Intubation Type: IV induction Ventilation: Mask ventilation without difficulty LMA: LMA inserted LMA Size: 4.0 Number of attempts: 1 Airway Equipment and Method: bite block Placement Confirmation: positive ETCO2 Tube secured with: Tape Dental Injury: Teeth and Oropharynx as per pre-operative assessment

## 2014-09-27 NOTE — Anesthesia Preprocedure Evaluation (Signed)
Anesthesia Evaluation  Patient identified by MRN, date of birth, ID band Patient awake    Reviewed: Allergy & Precautions, H&P , NPO status , Patient's Chart, lab work & pertinent test results  History of Anesthesia Complications (+) PONV and history of anesthetic complications  Airway Mallampati: II TM Distance: >3 FB Neck ROM: Full    Dental  (+) Teeth Intact, Dental Advisory Given   Pulmonary neg pulmonary ROS,    Pulmonary exam normal       Cardiovascular negative cardio ROS      Neuro/Psych PSYCHIATRIC DISORDERS Depression negative neurological ROS     GI/Hepatic negative GI ROS, Neg liver ROS,   Endo/Other  negative endocrine ROS  Renal/GU negative Renal ROS     Musculoskeletal   Abdominal   Peds  Hematology   Anesthesia Other Findings   Reproductive/Obstetrics                           Anesthesia Physical Anesthesia Plan  ASA: II  Anesthesia Plan: General   Post-op Pain Management:    Induction: Intravenous  Airway Management Planned: Mask  Additional Equipment:   Intra-op Plan:   Post-operative Plan: Extubation in OR  Informed Consent: I have reviewed the patients History and Physical, chart, labs and discussed the procedure including the risks, benefits and alternatives for the proposed anesthesia with the patient or authorized representative who has indicated his/her understanding and acceptance.   Dental advisory given  Plan Discussed with: CRNA, Anesthesiologist and Surgeon  Anesthesia Plan Comments:         Anesthesia Quick Evaluation

## 2014-09-28 ENCOUNTER — Encounter (HOSPITAL_BASED_OUTPATIENT_CLINIC_OR_DEPARTMENT_OTHER): Payer: Self-pay | Admitting: Orthopaedic Surgery

## 2014-09-28 NOTE — Op Note (Signed)
[name] [name], KNOKE                ACCOUNT NO.:  [id number]  MEDICAL RECORD NO.:  [id number]  LOCATION:                               FACILITY:  [facility]  PHYSICIAN:  Vonna Kotyk. Durward Fortes, M.D.    DATE OF BIRTH:  DATE OF PROCEDURE:  09/27/2014 DATE OF DISCHARGE:  09/27/2014                              OPERATIVE REPORT   PREOPERATIVE DIAGNOSIS:  Recalcitrant plantar fasciitis, right heel.  POSTOPERATIVE DIAGNOSIS:  Recalcitrant plantar fasciitis, right heel.  PROCEDURE:  Release of plantar fascia of the left heel, excision of calcaneal osteophyte.  SURGEON:  Vonna Kotyk. Durward Fortes, M.D.  ASSISTANT:  Biagio Borg PA-C.  ANESTHESIA:  General.  COMPLICATIONS:  None.  HISTORY:  A 48 year old female, who has had progressive problems with the plantar fasciitis of the right foot.  She has had a number of injections with immobilization and anti-inflammatory medicines with only temporary relief of her pain.  She has had an MRI scan that reveals some thickening of the medial cord associated with possible partial tearing. She has had a prior plantar fascia release on the left side and has done very well and wishes to proceed with a similar procedure on the right.  DESCRIPTION OF PROCEDURE:  Ms. Dadisman was met in the holding area, identified the right foot as the appropriate operative site.  She was then transported to room #8 and placed under general anesthesia without difficulty.  The right lower extremity was then prepped with DuraPrep in the tips of the toes to the knee.  Sterile draping was performed.  With the extremity elevated, it was Esmarch exsanguinated, protecting neurovascular bundles medially with a 4 x 4 gauze.  About an inch incision was made over the plantar fascia on the medial aspect of the foot in line with the plantar fascia.  Via sharp dissection, incision was carried down to subcutaneous tissue.  There was abundant adipose tissue that was just bluntly retracted.   [employer] retractors were then inserted.  I could visualize the medial port of the plantar fascia, was thickened, looked as if there may have been some partial tearing.  There were also several small cutaneous nerves were carefully identified and protected.  I released the plantar fascia along its entire length from its attachment to the os calcis.  I could visualize it laterally.  There was quite a bit of thick tissue along the medial cord, so I simply excised this to reduce its bulk and to potentially emanate reattachment.  It was also a small plantar heel spur that I removed with a rongeur.  The wound was then irrigated with saline solution with palpation.  I felt that I had an excellent release.  The Esmarch tourniquet was then removed.  We had some bleeding.  I infiltrated the skin, the subcu and deep tissue with 0.25% Marcaine with epinephrine and applied pressure with an excellent hemostasis.  The wound was then closed in several layers.  The subcu was closed with 3-0 Monocryl.  Skin closed with a running 4-0 Ethilon.  Sterile bulky dressing was applied followed by a posterior splint and an Ace bandage.  The patient tolerated the procedure without complications.  PLAN:  Oxycodone for pain.  Office, 1 week, crutches with only partial weightbearing.     Vonna Kotyk. Durward Fortes, M.D.     PWW/MEDQ  D:  09/27/2014  T:  09/28/2014  Job:  [id number]

## 2014-12-23 DIAGNOSIS — G510 Bell's palsy: Secondary | ICD-10-CM

## 2014-12-23 HISTORY — DX: Bell's palsy: G51.0

## 2015-02-23 ENCOUNTER — Other Ambulatory Visit: Payer: Self-pay | Admitting: Orthopaedic Surgery

## 2015-02-23 DIAGNOSIS — M25562 Pain in left knee: Secondary | ICD-10-CM

## 2015-03-21 ENCOUNTER — Other Ambulatory Visit: Payer: Self-pay

## 2015-04-04 ENCOUNTER — Other Ambulatory Visit: Payer: Self-pay

## 2015-05-25 ENCOUNTER — Ambulatory Visit
Admission: RE | Admit: 2015-05-25 | Discharge: 2015-05-25 | Disposition: A | Discharge status: Home / Self Care | Payer: No Typology Code available for payment source | Source: Ambulatory Visit | Attending: Orthopaedic Surgery | Admitting: Orthopaedic Surgery

## 2015-05-25 DIAGNOSIS — M25562 Pain in left knee: Secondary | ICD-10-CM

## 2015-06-22 NOTE — H&P (Addendum)
[name] Fears, MD   Biagio Borg, PA-C [address], [place], [place]  [address]                             [phone]   ORTHOPAEDIC HISTORY & PHYSICAL  [name] [name] MRN:  [id number] DOB/SEX:  [birth year]/female  CHIEF COMPLAINT:  Painful left knee  HISTORY:[name] [name] has been followed more recently for the problems referable to her left knee.  She has had a lot of popping, clicking and catching and the sensation of her knee wanting to give way.  She has had approximately three cortisone injections each of which have made a difference, but the problem simply recurs to the point of compromise.  She often times has to walk with a "bent knee" with the pain localized along the lateral aspect of her patella.     In the past she has had significant patella crepitation with motion with pain along the lateral compartment and again, she has had cortisone in both the medial and lateral parapatellar region with some relief for a short period of time.  Accordingly we suggested that she have an MRI scan.  The MRI scan was performed on 05/25/2015.  The report notes that the menisci appeared to be okay.  There was mild degenerative signal in the posterior horn of the medial meniscus and the lateral meniscus was intact.  The cruciate and collateral ligaments were intact.  The patellofemoral cartilage revealed marked hyaline cartilage loss about the lateral patella facet and lateral femoral trochlea with a small underlying subchondral cyst.  She had lateral tilt.  The medial compartment was intact.  There was hyaline cartilage thinning in the lateral compartment and irregularity essentially in the lateral compartment without underlying reactive marrow signal changes or full-thickness defect.  She had small osteophytes identified about the knee, but no worrisome marrow lesion and no popliteal cysts.     She did have a prior left knee arthroscopy in September 2004 with a similar problem.  I performed an  arthroscopic lateral release and then she was noted to have cartilage loss along the lateral femoral condyle and eventually had Carticel implantation and did very, very well until last year.  The Carticel implantation was performed 11/24/2003.     PAST MEDICAL HISTORY: Patient Active Problem List   Diagnosis Date Noted  . Plantar fasciitis of right foot 09/27/2014   Past Medical History  Diagnosis Date  . Hypercholesteremia   . Depression   . PONV (postoperative nausea and vomiting)     would like scope patch   Past Surgical History  Procedure Laterality Date  . Knee surgery    . Foot surgery    . Tonsillectomy    . Tummy tuck      2010  . Plantar fascia release Right 09/27/2014    Procedure: PLANTAR FASCIA RELEASE;  Surgeon: Garald Balding, MD;  Location: [facility];  Service: Orthopedics;  Laterality: Right;     MEDICATIONS:   Prescriptions prior to admission  Medication Sig Dispense Refill Last Dose  . ibuprofen (ADVIL,MOTRIN) 800 MG tablet Take 800 mg by mouth every 8 (eight) hours as needed.   06/28/2015 at Unknown time  . phentermine 30 MG capsule Take 37.5 mg by mouth every morning.   Past Week at Unknown time  . sertraline (ZOLOFT) 50 MG tablet Take 50 mg by mouth daily.    06/29/2015 at 0600  ALLERGIES:   Allergies  Allergen Reactions  . Decadron [Dexamethasone Sodium Phosphate] Anaphylaxis    REVIEW OF SYSTEMS:  Behavioral/Psych: positive for depression   FAMILY HISTORY:  History reviewed. No pertinent family history.  SOCIAL HISTORY:   History  Substance Use Topics  . Smoking status: Never Smoker   . Smokeless tobacco: Not on file  . Alcohol Use: No      EXAMINATION: Vital signs in last 24 hours: Temp:  [98.6 F (37 C)] 98.6 F (37 C) (07/07 0839) Pulse Rate:  [58] 58 (07/07 0839) Resp:  [20] 20 (07/07 0839) BP: (111)/(63) 111/63 mmHg (07/07 0839) SpO2:  [99 %] 99 % (07/07 0839) Weight:  [70.308 kg (155 lb)] 70.308 kg (155 lb)  (07/07 0839)  Head is normocephalic.   Eyes:  Pupils equal, round and reactive to light and accommodation.  Extraocular intact. ENT: Ears, nose, and throat were benign.   Neck: supple, no bruits were noted.   Chest: good expansion.   Lungs: essentially clear.   Cardiac: regular rhythm and rate, normal S1, S2.  No murmurs appreciated. Pulses :  2+ bilateral and symmetric in lower extremities. Abdomen is scaphoid, soft, nontender, no masses palpable, normal bowel sounds                  present. CNS:  He is oriented x3 and cranial nerves II-XII grossly intact. Breast, rectal, and genital exams: not performed and not indicated for an orthopedic evaluation. Musculoskeletal: on exam she does have quite a bit of pain beneath the patella particularly laterally.  There is quite a bit of crepitation.  There is no effusion and no particular joint pain medially or laterally.  The pain all seems to be localized about the patella.     Imaging Review MRI revealed marked cartilage loss about the lateral patellar facet and lateral femoral trochlea with associate subchondral cysts with grade 4 changes.  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  Past Medical History  Diagnosis Date  . Hypercholesteremia   . Depression   . PONV (postoperative nausea and vomiting)     would like scope patch    PLAN: Plan for LEFT KNEE ARTHROSCOPY, DEBRIDEMENT ARTICULAR CARTILAGE PATELLA, LATERAL COMPARTMENT, WITH POSSIBLE LATERAL RELEASE  The procedure,  risks, and benefits of surgery were presented and reviewed. The risks including but not limited to infection, blood clots, vascular and nerve injury, stiffness,  among others were discussed. The patient acknowledged the explanation, agreed to proceed.   Mike Craze [place], [place] [phone]  06/29/2015 9:26  AM

## 2015-06-23 ENCOUNTER — Encounter (HOSPITAL_BASED_OUTPATIENT_CLINIC_OR_DEPARTMENT_OTHER): Payer: Self-pay | Admitting: *Deleted

## 2015-06-29 ENCOUNTER — Ambulatory Visit (HOSPITAL_BASED_OUTPATIENT_CLINIC_OR_DEPARTMENT_OTHER)
Admission: RE | Admit: 2015-06-29 | Discharge: 2015-06-29 | Disposition: A | Discharge status: Home / Self Care | Payer: Self-pay | Source: Ambulatory Visit | Attending: Orthopaedic Surgery | Admitting: Orthopaedic Surgery

## 2015-06-29 ENCOUNTER — Ambulatory Visit (HOSPITAL_BASED_OUTPATIENT_CLINIC_OR_DEPARTMENT_OTHER): Payer: Self-pay | Admitting: Anesthesiology

## 2015-06-29 ENCOUNTER — Encounter (HOSPITAL_BASED_OUTPATIENT_CLINIC_OR_DEPARTMENT_OTHER)
Admission: RE | Disposition: A | Discharge status: Home / Self Care | Payer: Self-pay | Source: Ambulatory Visit | Attending: Orthopaedic Surgery

## 2015-06-29 ENCOUNTER — Encounter (HOSPITAL_BASED_OUTPATIENT_CLINIC_OR_DEPARTMENT_OTHER): Payer: Self-pay | Admitting: *Deleted

## 2015-06-29 DIAGNOSIS — M228X2 Other disorders of patella, left knee: Secondary | ICD-10-CM | POA: Insufficient documentation

## 2015-06-29 DIAGNOSIS — Z79899 Other long term (current) drug therapy: Secondary | ICD-10-CM | POA: Insufficient documentation

## 2015-06-29 DIAGNOSIS — S83207D Unspecified tear of unspecified meniscus, current injury, left knee, subsequent encounter: Secondary | ICD-10-CM

## 2015-06-29 DIAGNOSIS — M2242 Chondromalacia patellae, left knee: Secondary | ICD-10-CM | POA: Insufficient documentation

## 2015-06-29 DIAGNOSIS — Y929 Unspecified place or not applicable: Secondary | ICD-10-CM | POA: Insufficient documentation

## 2015-06-29 DIAGNOSIS — M958 Other specified acquired deformities of musculoskeletal system: Secondary | ICD-10-CM

## 2015-06-29 DIAGNOSIS — Y939 Activity, unspecified: Secondary | ICD-10-CM | POA: Insufficient documentation

## 2015-06-29 DIAGNOSIS — F329 Major depressive disorder, single episode, unspecified: Secondary | ICD-10-CM | POA: Insufficient documentation

## 2015-06-29 DIAGNOSIS — X58XXXA Exposure to other specified factors, initial encounter: Secondary | ICD-10-CM | POA: Insufficient documentation

## 2015-06-29 DIAGNOSIS — Y999 Unspecified external cause status: Secondary | ICD-10-CM | POA: Insufficient documentation

## 2015-06-29 DIAGNOSIS — S83282A Other tear of lateral meniscus, current injury, left knee, initial encounter: Secondary | ICD-10-CM | POA: Insufficient documentation

## 2015-06-29 DIAGNOSIS — M65812 Other synovitis and tenosynovitis, left shoulder: Secondary | ICD-10-CM | POA: Insufficient documentation

## 2015-06-29 DIAGNOSIS — S83209A Unspecified tear of unspecified meniscus, current injury, unspecified knee, initial encounter: Secondary | ICD-10-CM

## 2015-06-29 DIAGNOSIS — M179 Osteoarthritis of knee, unspecified: Secondary | ICD-10-CM | POA: Insufficient documentation

## 2015-06-29 HISTORY — PX: KNEE ARTHROSCOPY WITH LATERAL MENISECTOMY: SHX6193

## 2015-06-29 HISTORY — PX: CHONDROPLASTY: SHX5177

## 2015-06-29 HISTORY — PX: KNEE ARTHROSCOPY WITH LATERAL RELEASE: SHX5649

## 2015-06-29 HISTORY — PX: KNEE ARTHROSCOPY WITH DRILLING/MICROFRACTURE: SHX6425

## 2015-06-29 LAB — POCT HEMOGLOBIN-HEMACUE: HEMOGLOBIN: 14.2 g/dL (ref 12.0–15.0)

## 2015-06-29 SURGERY — ARTHROSCOPY, KNEE, WITH LATERAL MENISCECTOMY
Anesthesia: General | Site: Knee | Laterality: Left

## 2015-06-29 MED ORDER — OXYCODONE HCL 5 MG PO TABS
ORAL_TABLET | ORAL | Status: AC
  Filled 2015-06-29: qty 1

## 2015-06-29 MED ORDER — HYDROMORPHONE HCL 1 MG/ML IJ SOLN
INTRAMUSCULAR | Status: AC
  Filled 2015-06-29: qty 1

## 2015-06-29 MED ORDER — LACTATED RINGERS IV SOLN
INTRAVENOUS | Status: DC
  Administered 2015-06-29 (×2): via INTRAVENOUS

## 2015-06-29 MED ORDER — ONDANSETRON HCL 4 MG/2ML IJ SOLN
INTRAMUSCULAR | Status: DC | PRN
  Administered 2015-06-29: 4 mg via INTRAVENOUS

## 2015-06-29 MED ORDER — SODIUM CHLORIDE 0.9 % IR SOLN
Status: DC | PRN
Start: 2015-06-29 — End: 2015-06-29
  Administered 2015-06-29: 9000 mL

## 2015-06-29 MED ORDER — PROPOFOL 10 MG/ML IV BOLUS
INTRAVENOUS | Status: AC
Start: 2015-06-29 — End: 2015-06-29
  Filled 2015-06-29: qty 20

## 2015-06-29 MED ORDER — OXYCODONE HCL 5 MG/5ML PO SOLN: 5.0000 mg | Freq: Once | ORAL | Status: AC | PRN

## 2015-06-29 MED ORDER — PROPOFOL 10 MG/ML IV BOLUS
INTRAVENOUS | Status: DC | PRN
  Administered 2015-06-29: 250 mg via INTRAVENOUS

## 2015-06-29 MED ORDER — BUPIVACAINE-EPINEPHRINE 0.5% -1:200000 IJ SOLN
INTRAMUSCULAR | Status: DC | PRN
  Administered 2015-06-29: 30 mL

## 2015-06-29 MED ORDER — CHLORHEXIDINE GLUCONATE 4 % EX LIQD: 60.0000 mL | Freq: Once | CUTANEOUS | Status: DC

## 2015-06-29 MED ORDER — OXYCODONE HCL 5 MG PO TABS
5.0000 mg | ORAL_TABLET | Freq: Once | ORAL | Status: AC | PRN
  Administered 2015-06-29: 5 mg via ORAL

## 2015-06-29 MED ORDER — FENTANYL CITRATE (PF) 100 MCG/2ML IJ SOLN
INTRAMUSCULAR | Status: AC
  Filled 2015-06-29: qty 6

## 2015-06-29 MED ORDER — OXYCODONE-ACETAMINOPHEN 5-325 MG PO TABS: 1.0000 | ORAL_TABLET | ORAL | Status: DC | PRN

## 2015-06-29 MED ORDER — SCOPOLAMINE 1 MG/3DAYS TD PT72: 1.0000 | MEDICATED_PATCH | Freq: Once | TRANSDERMAL | Status: DC | PRN

## 2015-06-29 MED ORDER — EPHEDRINE SULFATE 50 MG/ML IJ SOLN
INTRAMUSCULAR | Status: DC | PRN
  Administered 2015-06-29: 10 mg via INTRAVENOUS

## 2015-06-29 MED ORDER — LIDOCAINE HCL (CARDIAC) 20 MG/ML IV SOLN
INTRAVENOUS | Status: DC | PRN
  Administered 2015-06-29: 80 mg via INTRAVENOUS

## 2015-06-29 MED ORDER — MIDAZOLAM HCL 2 MG/2ML IJ SOLN
INTRAMUSCULAR | Status: AC
  Filled 2015-06-29: qty 2

## 2015-06-29 MED ORDER — GLYCOPYRROLATE 0.2 MG/ML IJ SOLN
0.2000 mg | Freq: Once | INTRAMUSCULAR | Status: AC | PRN
  Administered 2015-06-29: 0.2 mg via INTRAVENOUS

## 2015-06-29 MED ORDER — SCOPOLAMINE 1 MG/3DAYS TD PT72: 1.0000 | MEDICATED_PATCH | TRANSDERMAL | Status: DC

## 2015-06-29 MED ORDER — HYDROMORPHONE HCL 1 MG/ML IJ SOLN
0.2500 mg | INTRAMUSCULAR | Status: DC | PRN
  Administered 2015-06-29 (×4): 0.5 mg via INTRAVENOUS

## 2015-06-29 MED ORDER — LIDOCAINE-EPINEPHRINE (PF) 1 %-1:200000 IJ SOLN
INTRAMUSCULAR | Status: AC
  Filled 2015-06-29: qty 10

## 2015-06-29 MED ORDER — BUPIVACAINE-EPINEPHRINE (PF) 0.5% -1:200000 IJ SOLN
INTRAMUSCULAR | Status: AC
  Filled 2015-06-29: qty 30

## 2015-06-29 MED ORDER — MIDAZOLAM HCL 2 MG/2ML IJ SOLN
1.0000 mg | INTRAMUSCULAR | Status: DC | PRN
  Administered 2015-06-29: 2 mg via INTRAVENOUS

## 2015-06-29 MED ORDER — MEPERIDINE HCL 25 MG/ML IJ SOLN: 6.2500 mg | INTRAMUSCULAR | Status: DC | PRN

## 2015-06-29 MED ORDER — FENTANYL CITRATE (PF) 100 MCG/2ML IJ SOLN
50.0000 ug | INTRAMUSCULAR | Status: AC | PRN
  Administered 2015-06-29 (×2): 25 ug via INTRAVENOUS
  Administered 2015-06-29: 100 ug via INTRAVENOUS

## 2015-06-29 MED ORDER — SODIUM CHLORIDE 0.9 % IV SOLN: INTRAVENOUS | Status: DC

## 2015-06-29 MED ORDER — SCOPOLAMINE 1 MG/3DAYS TD PT72
MEDICATED_PATCH | TRANSDERMAL | Status: AC
  Filled 2015-06-29: qty 1

## 2015-06-29 SURGICAL SUPPLY — 33 items
BANDAGE ELASTIC 6 VELCRO ST LF (GAUZE/BANDAGES/DRESSINGS) ×4 IMPLANT
BLADE 4.2CUDA (BLADE) IMPLANT
BLADE CUDA SHAVER 3.5 (BLADE) ×4 IMPLANT
BLADE CUTTER GATOR 3.5 (BLADE) IMPLANT
BLADE GREAT WHITE 4.2 (BLADE) IMPLANT
BLADE GREAT WHITE 4.2MM (BLADE)
BNDG GAUZE ELAST 4 BULKY (GAUZE/BANDAGES/DRESSINGS) ×4 IMPLANT
DRAPE ARTHROSCOPY W/POUCH 90 (DRAPES) ×4 IMPLANT
DRSG EMULSION OIL 3X3 NADH (GAUZE/BANDAGES/DRESSINGS) ×4 IMPLANT
DURAPREP 26ML APPLICATOR (WOUND CARE) ×4 IMPLANT
ELECT MENISCUS 165MM 90D (ELECTRODE) ×3 IMPLANT
ELECT REM PT RETURN 9FT ADLT (ELECTROSURGICAL) ×4
ELECTRODE REM PT RTRN 9FT ADLT (ELECTROSURGICAL) ×1 IMPLANT
GLOVE BIO SURGEON STRL SZ8 (GLOVE) ×4 IMPLANT
GLOVE BIOGEL PI IND STRL 8.5 (GLOVE) ×1 IMPLANT
GLOVE BIOGEL PI INDICATOR 8.5 (GLOVE) ×2
GLOVE ECLIPSE 8.0 STRL XLNG CF (GLOVE) ×7 IMPLANT
GOWN STRL REUS W/ TWL LRG LVL3 (GOWN DISPOSABLE) ×2 IMPLANT
GOWN STRL REUS W/TWL LRG LVL3 (GOWN DISPOSABLE) ×4
HOLDER KNEE FOAM BLUE (MISCELLANEOUS) ×4 IMPLANT
IMMOBILIZER KNEE 22 UNIV (SOFTGOODS) ×3 IMPLANT
IV NS IRRIG 3000ML ARTHROMATIC (IV SOLUTION) ×9 IMPLANT
KNEE WRAP E Z 3 GEL PACK (MISCELLANEOUS) ×4 IMPLANT
MANIFOLD NEPTUNE II (INSTRUMENTS) ×3 IMPLANT
PACK ARTHROSCOPY DSU (CUSTOM PROCEDURE TRAY) ×4 IMPLANT
PACK BASIN DAY SURGERY FS (CUSTOM PROCEDURE TRAY) ×4 IMPLANT
PENCIL BUTTON HOLSTER BLD 10FT (ELECTRODE) ×3 IMPLANT
SET ARTHROSCOPY TUBING (MISCELLANEOUS) ×4
SET ARTHROSCOPY TUBING LN (MISCELLANEOUS) ×2 IMPLANT
SUT ETHILON 4 0 PS 2 18 (SUTURE) ×3 IMPLANT
TOWEL OR 17X24 6PK STRL BLUE (TOWEL DISPOSABLE) ×4 IMPLANT
WAND STAR VAC 90 (SURGICAL WAND) ×3 IMPLANT
WATER STERILE IRR 1000ML POUR (IV SOLUTION) ×4 IMPLANT

## 2015-06-29 NOTE — Transfer of Care (Signed)
Immediate Anesthesia Transfer of Care Note  Patient: [name] [name]  Procedure(s) Performed: Procedure(s): KNEE ARTHROSCOPY WITH LATERAL MENISECTOMY (Left) KNEE ARTHROSCOPY WITH LATERAL RELEASE (Left) KNEE ARTHROSCOPY WITH DRILLING/MICROFRACTURE (Left) CHONDROPLASTY (Left)  Patient Location: PACU  Anesthesia Type:General  Level of Consciousness: awake, patient cooperative and confused  Airway & Oxygen Therapy: Patient Spontanous Breathing and Patient connected to face mask oxygen  Post-op Assessment: Report given to RN and Post -op Vital signs reviewed and stable  Post vital signs: Reviewed and stable  Last Vitals:  Filed Vitals:   06/29/15 0839  BP: 111/63  Pulse: 58  Temp: 37 C  Resp: 20    Complications: No apparent anesthesia complications

## 2015-06-29 NOTE — Anesthesia Preprocedure Evaluation (Signed)
Anesthesia Evaluation  Patient identified by MRN, date of birth, ID band Patient awake    Reviewed: Allergy & Precautions, NPO status , Patient's Chart, lab work & pertinent test results  History of Anesthesia Complications (+) PONV  Airway Mallampati: I  TM Distance: >3 FB Neck ROM: Full    Dental  (+) Teeth Intact, Dental Advisory Given   Pulmonary  breath sounds clear to auscultation        Cardiovascular Rhythm:Regular Rate:Normal     Neuro/Psych    GI/Hepatic   Endo/Other    Renal/GU      Musculoskeletal   Abdominal   Peds  Hematology   Anesthesia Other Findings   Reproductive/Obstetrics                             Anesthesia Physical Anesthesia Plan  ASA: I  Anesthesia Plan: General   Post-op Pain Management:    Induction: Intravenous  Airway Management Planned: LMA  Additional Equipment:   Intra-op Plan:   Post-operative Plan: Extubation in OR  Informed Consent: I have reviewed the patients History and Physical, chart, labs and discussed the procedure including the risks, benefits and alternatives for the proposed anesthesia with the patient or authorized representative who has indicated his/her understanding and acceptance.   Dental advisory given  Plan Discussed with: CRNA, Anesthesiologist and Surgeon  Anesthesia Plan Comments:         Anesthesia Quick Evaluation

## 2015-06-29 NOTE — Op Note (Signed)
PATIENT ID:      Kambryn Dapolito  MRN:     [id number] DOB/AGE:    Jul 05, 1966 / 49 y.o.       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                                                       Estimated body mass index is 25.79 kg/(m^2) as calculated from the following:   Height as of this encounter: 5\' 5"  (1.651 m).   Weight as of this encounter: 70.308 kg (155 lb).     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                                                                     Estimated body mass index is 25.79 kg/(m^2) as calculated from the following:   Height as of this encounter: 5\' 5"  (1.651 m).   Weight as of this encounter: 70.308 kg (155 lb).     PROCEDURE:  Procedure(s):LEFT KNEE ARTHROSCOPY, DEBRIDEMENT ARTICULAR CARTILAGE PATELLA, LATERAL COMPARTMENT, WITH LATERAL RELEASE, PARTIAL LATERAL MENISECTOMY, MICROFRACTURE LATERAL FEMORAL CONDYLE,REMOVAL CARTILAGINOUS LOOSE BODY     SURGEON:  [name], MD    ASSISTANT:   Biagio Borg, PA-C   (Present and scrubbed throughout the case, critical for assistance with exposure, retraction, instrumentation, and closure.)          ANESTHESIA: general     DRAINS: none :      TOURNIQUET TIME: * No tourniquets in log *    COMPLICATIONS:  None   CONDITION:  stable  PROCEDURE IN DETAIL: [id number]   Emalene Welte W 06/29/2015, 10:17 AM

## 2015-06-29 NOTE — Anesthesia Postprocedure Evaluation (Signed)
  Anesthesia Post-op Note  Patient: [name] [name]  Procedure(s) Performed: Procedure(s): KNEE ARTHROSCOPY WITH LATERAL MENISECTOMY (Left) KNEE ARTHROSCOPY WITH LATERAL RELEASE (Left) KNEE ARTHROSCOPY WITH DRILLING/MICROFRACTURE (Left) CHONDROPLASTY (Left)  Patient Location: PACU  Anesthesia Type: General   Level of Consciousness: awake, alert  and oriented  Airway and Oxygen Therapy: Patient Spontanous Breathing  Post-op Pain: moderate  Post-op Assessment: Post-op Vital signs reviewed  Post-op Vital Signs: Reviewed  Last Vitals:  Filed Vitals:   06/29/15 1200  BP: 122/71  Pulse: 78  Temp: 36.6 C  Resp: 16    Complications: No apparent anesthesia complications

## 2015-06-29 NOTE — Discharge Instructions (Signed)

## 2015-06-29 NOTE — Anesthesia Procedure Notes (Signed)
Procedure Name: LMA Insertion Date/Time: 06/29/2015 9:24 AM Performed by: Lyndee Leo Pre-anesthesia Checklist: Patient identified, Emergency Drugs available, Suction available and Patient being monitored Patient Re-evaluated:Patient Re-evaluated prior to inductionOxygen Delivery Method: Circle System Utilized Preoxygenation: Pre-oxygenation with 100% oxygen Intubation Type: IV induction Ventilation: Mask ventilation without difficulty LMA: LMA inserted LMA Size: 4.0 Number of attempts: 1 Airway Equipment and Method: Bite block Placement Confirmation: positive ETCO2 Tube secured with: Tape Dental Injury: Teeth and Oropharynx as per pre-operative assessment

## 2015-06-29 NOTE — H&P (Signed)
  The recent History & Physical has been reviewed. I have personally examined the patient today. There is no interval change to the documented History & Physical. The patient would like to proceed with the procedure.  [name] W 06/29/2015,  9:12 AM

## 2015-06-30 ENCOUNTER — Encounter (HOSPITAL_BASED_OUTPATIENT_CLINIC_OR_DEPARTMENT_OTHER): Payer: Self-pay | Admitting: Orthopaedic Surgery

## 2015-06-30 NOTE — Op Note (Signed)
[name] [name]                ACCOUNT NO.:  [id number]  MEDICAL RECORD NO.:  [id number]  LOCATION:                               FACILITY:  [facility]  PHYSICIAN:  Vonna Kotyk. 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  PROCEDURES: 1. Diagnostic arthroscopy, left knee. 2. Partial lateral meniscectomy. 3. Removal of cartilaginous loose body. 4. Debridement of patella and lateral femoral condyle with     microfracture of lateral femoral condyle. 5. Arthroscopic lateral release.  SURGEON:  Vonna Kotyk. Durward Fortes, M.D.  ASSISTANT:  Aaron Edelman D. Petrarca, PA-C  ANESTHESIA:  General laryngeal with local knee block.  COMPLICATIONS:  None.  HISTORY:  A 49 year old female has had a chronic problem with her left knee over a period of many years.  She has had a prior Carticel procedure to lateral femoral condyle well over 10 years ago and has done very well until the last 4 to 6 months.  There is no history of injury or trauma, but she has had persistent pain to the point of compromise and activity modification.  She has had several cortisone injections that have given her temporary relief of her pain.  Because of her persistent discomfort, she has had an MRI scan performed on May 25, 2015, with mild degenerative signal in the posterior horn of the medial meniscus and lateral meniscus, but it appeared to be intact.  Cruciate and collateral ligaments were intact.  She had marked hyaline cartilage loss in the patellofemoral joint and about the lateral patella  facet with a lateral patellar tilt.  Medial compartment was intact.  Because of her persistent pain and compromise, she is now to have an arthroscopic evaluation.  DESCRIPTION OF PROCEDURE:  [name] [name] was met in the holding area with her daughters and identified the left knee as appropriate operative site and marked it accordingly.  She was then transported to room #8 and placed under general laryngeal anesthesia without difficulty.  The left lower extremity was placed in a thigh holder.  The leg was then prepped with DuraPrep from the thigh holder to the ankle.  Sterile draping was performed.  Time-out was called.  Diagnostic arthroscopy was then performed of the left knee using the medial and lateral parapatellar, then puncture site.  The arthroscope was placed initially to the medial portal.  Diagnostic arthroscopy revealed little if any effusion.  There was obvious chondromalacia of the patella, particularly laterally where there was an increased tilt. There were areas of complete cartilage loss along the lateral patellar facet and on the corresponding lateral femoral condyle.  There had been a previous Carticel procedure to the lateral femoral condyle, looked quite good.  The area was completely devoid of articular cartilage withdrawal and exposed subchondral bone.  I debrided any loose articular cartilage from the patella laterally and about the lesion  on the lateral femoral condyle and performed an arthroscopic lateral release using the needle-tip Bovie.  I obtained a very nice release with decreased tilt. At that point, I could easily place instruments between the patella and lateral femoral condyle which I could not try to release.  I also performed a microfracture into the lateral femoral condyle lesion.  There was some chondromalacia at the weightbearing surface of the lateral femoral condyle, representing grade 2 changes, but there was a tear at the lateral meniscus and  anterior root.  This was debrided with the basket forceps, Cuda shaver, and then tapered with the ArthroCare wand.  It was carefully probed, the remainder was intact.  I did find a cartilaginous loose body that measured about 5 x 5 mm in the superior pouch that obviously originated from the lateral femoral condyle.  The ACL and PCL appeared to be intact.  The medial compartment was clear of chondromalacia or meniscal pathology.  There was very minimal synovitis within the joint.  The joint was then re-explored without evidence of loose material.  The joint was copiously irrigated with saline solution and 2 portals left open.  I infiltrated the area of the lateral release with 0.25% Marcaine with epinephrine.  Sterile bulky dressing was applied followed by a foam bolster laterally and an Ace bandage.  The patient was awoken, placed on the operating room stretcher, and returned to the postanesthesia recovery room in satisfactory condition.  PLAN:  Crutches, ice, oxycodone for pain.  Office, 1 week.     Vonna Kotyk. Durward Fortes, M.D.   ______________________________ Vonna Kotyk. Durward Fortes, M.D.    PWW/MEDQ  D:  06/29/2015  T:  06/30/2015  Job:  [id number]

## 2016-11-11 ENCOUNTER — Telehealth (INDEPENDENT_AMBULATORY_CARE_PROVIDER_SITE_OTHER): Payer: Self-pay | Admitting: Orthopaedic Surgery

## 2016-11-11 NOTE — Telephone Encounter (Signed)
Patient would like for Dr. Durward Fortes to go ahead and send the referral to Dr. Marlou Sa for a knee replacement. Patient states she needs the replacement done in January due to personal financial issues. Patient said Dr. Durward Fortes can call her If he has any questions.

## 2016-11-12 NOTE — Telephone Encounter (Signed)
Please advise 

## 2016-11-25 NOTE — Telephone Encounter (Signed)
Please make an appt for Dr Ninfa Linden to evaluate-specifically if she would be a candidate for patella resurfacing vs TKR

## 2016-11-25 NOTE — Telephone Encounter (Signed)
Please advise 

## 2016-12-09 ENCOUNTER — Telehealth (INDEPENDENT_AMBULATORY_CARE_PROVIDER_SITE_OTHER): Payer: Self-pay | Admitting: Orthopaedic Surgery

## 2016-12-09 NOTE — Telephone Encounter (Signed)
Patient states that she was told by Dr Durward Fortes that she would be seeing Dr Marlou Sa for her knee & the appointment was made with Dr Ninfa Linden. She just wanted to confirm that her appointment was made with the correct physician. Can you please check with Dr Durward Fortes and let me know?

## 2016-12-10 NOTE — Telephone Encounter (Signed)
Please schedule with Dr Marlou Sa

## 2016-12-10 NOTE — Telephone Encounter (Signed)
Marlou Sa or Ninfa Linden?

## 2016-12-10 NOTE — Telephone Encounter (Signed)
Dean-have Dr Marlou Sa call me when he sees the patient

## 2016-12-12 ENCOUNTER — Ambulatory Visit (INDEPENDENT_AMBULATORY_CARE_PROVIDER_SITE_OTHER): Payer: Self-pay | Admitting: Orthopaedic Surgery

## 2016-12-30 NOTE — Telephone Encounter (Signed)
Dusty will you call and schedule?  thanks

## 2016-12-30 NOTE — Telephone Encounter (Signed)
Please schedule with Dr Dean.  Thanks.  

## 2016-12-30 NOTE — Telephone Encounter (Signed)
[name] Bal, do I need to call this patient and schedule an appt with Dr Marlou Sa?

## 2016-12-30 NOTE — Telephone Encounter (Signed)
Discuss with Marcie Bal, do we need to call patient and sched appt with Dr Marlou Sa?

## 2016-12-31 ENCOUNTER — Telehealth (INDEPENDENT_AMBULATORY_CARE_PROVIDER_SITE_OTHER): Payer: Self-pay | Admitting: Orthopaedic Surgery

## 2016-12-31 ENCOUNTER — Ambulatory Visit (INDEPENDENT_AMBULATORY_CARE_PROVIDER_SITE_OTHER): Payer: Self-pay | Admitting: Orthopaedic Surgery

## 2016-12-31 NOTE — Telephone Encounter (Signed)
called

## 2016-12-31 NOTE — Telephone Encounter (Signed)
Patient request a call from Dr Durward Fortes himself to discuss a personal matter.

## 2017-01-01 ENCOUNTER — Ambulatory Visit (INDEPENDENT_AMBULATORY_CARE_PROVIDER_SITE_OTHER): Payer: Self-pay | Admitting: Orthopedic Surgery

## 2017-01-02 ENCOUNTER — Encounter (INDEPENDENT_AMBULATORY_CARE_PROVIDER_SITE_OTHER): Payer: Self-pay | Admitting: Orthopedic Surgery

## 2017-01-02 ENCOUNTER — Ambulatory Visit (INDEPENDENT_AMBULATORY_CARE_PROVIDER_SITE_OTHER): Payer: BLUE CROSS/BLUE SHIELD | Admitting: Orthopedic Surgery

## 2017-01-02 ENCOUNTER — Ambulatory Visit (INDEPENDENT_AMBULATORY_CARE_PROVIDER_SITE_OTHER): Payer: Self-pay

## 2017-01-02 ENCOUNTER — Encounter (INDEPENDENT_AMBULATORY_CARE_PROVIDER_SITE_OTHER): Payer: Self-pay

## 2017-01-02 DIAGNOSIS — M2242 Chondromalacia patellae, left knee: Secondary | ICD-10-CM | POA: Diagnosis not present

## 2017-01-02 NOTE — Progress Notes (Signed)
Office Visit Note   Patient: [name] [name]           Date of Birth: 08-Mar-1966           MRN: [id number] Visit Date: 01/02/2017 Requested by: No referring provider defined for this encounter. PCP: No PCP Per Patient  Subjective: Chief Complaint  Patient presents with  . Left Knee - Pain    HPI Unk Lightning a 51 year old female with left knee pain.  She's had multiple surgeries on that left knee in the past.  Most recently was arthroscopy in 2016.  The operative note and pictures are reviewed.  She has significant patellofemoral arthritis along with an osteochondral defect on that lateral femoral condyle in the trochlear region.  She states her pain is at a 10.  She has a lot of pain with all activities.  She has twins in college as well as a [address] year old Dr. Patsy Lager.  She describes pain with standing and pain with "everything".  She does report pain when going up and down stairs but importantly she does have a lot of loadbearing pain even on flat surfaces.  She can only walk less than a city block without significant pain which is a global type pain in her knee.  She can't run at all.  No family history of DVT or pulmonary embolism.  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  She was a Therapist, sports in college.  She has an allergy potentially to Decadron or its additives as well as an adverse reaction to some over-the-counter cold medicines..              Review of Systems All systems reviewed are negative as they relate to the chief complaint within the history of present illness.  Patient denies  fevers or chills.    Assessment & Plan: Visit Diagnoses:  1. Chondromalacia patellae, left knee     Plan: Impression is left knee pain in a patient who has a lot of current social stressors.  She does have pain on a daily basis as well as pain with this flat ground walking.  She describes rest pain as well as night pain in the knee.  The pain is a more  global in nature and not restricted to the anterior aspect of the knee.  She denies any mechanical symptoms.  She has had previous arthroscopy as well as previous Carticel implantation about 10 years ago.  She needs to find some type of employment which is difficult if not impossible with the knee in its current situation.  Decision point today was whether or not patellofemoral resurfacing versus total knee arthroplasty is her best option.  I would favor total knee replacement based on the summation of her clinical and social picture.  I think that would give her the highest likelihood of less pain in the knee.  Also she is having a lot of loadbearing pain with flat ground walking which indicates a problem potentially on the weightbearing surface as opposed to just in the patellofemoral joint.  I think she would potentially be a good candidate for press fit Stryker triathlon prosthesis Richard crease yet retaining in order to save bone for potential revision surgery in the future.  I'll talk with Dr. Durward Fortes and we will come up with the plan for this patient.  The risks and benefits of knee replacement discussed with the patient.  Time out of work and the extensive nature the  rehabilitation also discussed.  All in all we spent about an hour talking about things today  Follow-Up Instructions: No Follow-up on file.   Orders:  Orders Placed This Encounter  Procedures  . XR Knee 1-2 Views Left   No orders of the defined types were placed in this encounter.     Procedures: No procedures performed   Clinical Data: No additional findings.  Objective: Vital Signs: There were no vitals taken for this visit.  Physical Exam   Constitutional: Patient appears well-developed HEENT:  Head: Normocephalic Eyes:EOM are normal Neck: Normal range of motion Cardiovascular: Normal rate Pulmonary/chest: Effort normal Neurologic: Patient is alert Skin: Skin is warm Psychiatric: Patient has normal mood  and affect    Ortho Exam examination of the left knee demonstrates no effusion full extension good range of motion she has a lot of patellofemoral crepitus she does have some lateral joint line tenderness as well left versus right.  Extensor mechanism is intact pedal pulses palpable slight quad atrophy on the left compared to the right no real malalignment issues in terms of increased Q angle.  No patellar apprehension.  Specialty Comments:  No specialty comments available.  Imaging: Xr Knee 1-2 Views Left  Result Date: 01/02/2017 AP lateral left knee reviewed.  Merchant also reviewed.  Patellofemoral arthritis is present.  There some flattening of the lateral femoral condyle noted on the AP.  Medial lateral joint space is otherwise maintained.  Bone density looks reasonable.    PMFS History: Patient Active Problem List   Diagnosis Date Noted  . Chondromalacia patellae, left knee 01/02/2017  . Torn meniscus 06/29/2015  . Osteochondral defect of femoral condyle 06/29/2015  . Plantar fasciitis of right foot 09/27/2014   Past Medical History:  Diagnosis Date  . Depression   . Hypercholesteremia   . PONV (postoperative nausea and vomiting)    would like scope patch    No family history on file.  Past Surgical History:  Procedure Laterality Date  . CHONDROPLASTY Left 06/29/2015   Procedure: CHONDROPLASTY;  Surgeon: Garald Balding, MD;  Location: [facility];  Service: Orthopedics;  Laterality: Left;  . FOOT SURGERY    . KNEE ARTHROSCOPY WITH DRILLING/MICROFRACTURE Left 06/29/2015   Procedure: KNEE ARTHROSCOPY WITH DRILLING/MICROFRACTURE;  Surgeon: Garald Balding, MD;  Location: [facility];  Service: Orthopedics;  Laterality: Left;  . KNEE ARTHROSCOPY WITH LATERAL MENISECTOMY Left 06/29/2015   Procedure: KNEE ARTHROSCOPY WITH LATERAL MENISECTOMY;  Surgeon: Garald Balding, MD;  Location: [facility];  Service: Orthopedics;  Laterality:  Left;  . KNEE ARTHROSCOPY WITH LATERAL RELEASE Left 06/29/2015   Procedure: KNEE ARTHROSCOPY WITH LATERAL RELEASE;  Surgeon: Garald Balding, MD;  Location: [facility];  Service: Orthopedics;  Laterality: Left;  . KNEE SURGERY    . PLANTAR FASCIA RELEASE Right 09/27/2014   Procedure: PLANTAR FASCIA RELEASE;  Surgeon: Garald Balding, MD;  Location: [facility];  Service: Orthopedics;  Laterality: Right;  . TONSILLECTOMY    . Tummy tuck     2010   Social History   Occupational History  . Not on file.   Social History Main Topics  . Smoking status: Never Smoker  . Smokeless tobacco: Not on file  . Alcohol use No  . Drug use: No  . Sexual activity: Not Currently

## 2017-01-03 ENCOUNTER — Ambulatory Visit (INDEPENDENT_AMBULATORY_CARE_PROVIDER_SITE_OTHER): Payer: Self-pay | Admitting: Orthopedic Surgery

## 2017-01-06 ENCOUNTER — Telehealth (INDEPENDENT_AMBULATORY_CARE_PROVIDER_SITE_OTHER): Payer: Self-pay | Admitting: Orthopedic Surgery

## 2017-01-06 ENCOUNTER — Telehealth (INDEPENDENT_AMBULATORY_CARE_PROVIDER_SITE_OTHER): Payer: Self-pay | Admitting: Orthopaedic Surgery

## 2017-01-06 NOTE — Telephone Encounter (Signed)
See note from Dr Dean.  

## 2017-01-06 NOTE — Telephone Encounter (Signed)
I saw her as a second opinion at the request of Dr. Durward Fortes.  I think Dr. Durward Fortes was going to do her surgery.  Please call her to confirm thanks

## 2017-01-06 NOTE — Telephone Encounter (Signed)
Pt called wanting to schedule surgery with Dr. Marlou Sa, but I spoke with Marlou Sa and he stated you would be doing her total knee replacement. If she is ok to schedule please send a blue sheet. Dr. Marlou Sa stated she was a second opinion to see if she need a partial or complete and that she needed a complete so therefore you would be performing the surgery?

## 2017-01-06 NOTE — Telephone Encounter (Signed)
Message sent to [place] addressing this.

## 2017-01-06 NOTE — Telephone Encounter (Signed)
Spoke with pt and she said that Dr. Durward Fortes had sent her to you for you to do the surgery? That was the impression she got from Dr. Durward Fortes.

## 2017-01-06 NOTE — Telephone Encounter (Signed)
Thought I sent it over on Fri-will resend

## 2017-01-06 NOTE — Telephone Encounter (Signed)
Pt called wanting to schedule surgery. She stated that you guys discussed this month for her to have surgery. I don't have a surgery sheet. Her CB# is [phone]

## 2017-01-06 NOTE — Telephone Encounter (Signed)
Dr Marlou Sa deferring to Dr Durward Fortes since patient needs a total vs partial knee arthroplasty. Please call pt and advise.

## 2017-01-06 NOTE — Telephone Encounter (Signed)
Pt called back stating that she was referred to you from [facility] to do her surgery. She also said that last week at her appt you guys talked about her surgery and that she was told it can be done this month. I went on and looked at the snapboard and there isnt any OR time available except for Friday 01/17/17 in the afternoon. Also I need a blue sheet for this pt. Do you want me to schedule her for that day? She is for a TKA

## 2017-01-14 ENCOUNTER — Encounter (HOSPITAL_COMMUNITY): Payer: Self-pay

## 2017-01-14 ENCOUNTER — Encounter (HOSPITAL_COMMUNITY)
Admission: RE | Admit: 2017-01-14 | Discharge: 2017-01-14 | Disposition: A | Discharge status: Home / Self Care | Payer: BLUE CROSS/BLUE SHIELD | Source: Ambulatory Visit | Attending: Orthopaedic Surgery | Admitting: Orthopaedic Surgery

## 2017-01-14 DIAGNOSIS — Z01812 Encounter for preprocedural laboratory examination: Secondary | ICD-10-CM | POA: Insufficient documentation

## 2017-01-14 DIAGNOSIS — M25562 Pain in left knee: Secondary | ICD-10-CM | POA: Diagnosis not present

## 2017-01-14 HISTORY — DX: Bell's palsy: G51.0

## 2017-01-14 HISTORY — DX: Anxiety disorder, unspecified: F41.9

## 2017-01-14 HISTORY — DX: Malignant (primary) neoplasm, unspecified: C80.1

## 2017-01-14 HISTORY — DX: Unspecified osteoarthritis, unspecified site: M19.90

## 2017-01-14 LAB — SURGICAL PCR SCREEN
MRSA, PCR: NEGATIVE
STAPHYLOCOCCUS AUREUS: NEGATIVE

## 2017-01-14 LAB — CBC
HEMATOCRIT: 40.3 % (ref 36.0–46.0)
Hemoglobin: 14.1 g/dL (ref 12.0–15.0)
MCH: 33.8 pg (ref 26.0–34.0)
MCHC: 35 g/dL (ref 30.0–36.0)
MCV: 96.6 fL (ref 78.0–100.0)
Platelets: 252 10*3/uL (ref 150–400)
RBC: 4.17 MIL/uL (ref 3.87–5.11)
RDW: 11.5 % (ref 11.5–15.5)
WBC: 6.9 10*3/uL (ref 4.0–10.5)

## 2017-01-14 NOTE — Pre-Procedure Instructions (Signed)
    [name]  01/14/2017    Your procedure is scheduled on Tuesday, January 30.  Report to [facility] Admitting at 1:00 PM               Your surgery or procedure is scheduled for 3:00 PM   Call this number if you have problems the morning of surgery: [phone]                For any other questions, please call [phone], Monday - Friday 8 AM - 4 PM.    Remember:  Do not eat food or drink liquids after midnight Monday, January 29  Take these medicines the morning of surgery with A SIP OF WATER  :desvenlafaxine (PRISTIQ).                Take if needed: clonazePAM (KLONOPIN).  1 Week prior to surgery STOP taking Aspirin, Aspirin Products (Goody Powder, Excedrin Migraine), Ibuprofen (Advil), Naproxen (Aleve), Vitamins and Herbal Products (ie Fish Oil)   Do not wear jewelry, make-up or nail polish.  Do not wear lotions, powders, or perfumes, or deodorant.  Do not shave 48 hours prior to surgery.    Do not bring valuables to the hospital.  [facility] is not responsible for any belongings or valuables.  Contacts, dentures or bridgework may not be worn into surgery.  Leave your suitcase in the car.  After surgery it may be brought to your room.  For patients admitted to the hospital, discharge time will be determined by your treatment team.   Special instructions:  Review  [facility] - Preparing For Surgery.  Please read over the following fact sheets that you were given: [facility]- Preparing For Surgery and Patient Instructions for Mupirocin Application, Coughing , Pain Booklet

## 2017-01-15 ENCOUNTER — Ambulatory Visit (INDEPENDENT_AMBULATORY_CARE_PROVIDER_SITE_OTHER): Payer: BLUE CROSS/BLUE SHIELD | Admitting: Orthopedic Surgery

## 2017-01-15 ENCOUNTER — Encounter (INDEPENDENT_AMBULATORY_CARE_PROVIDER_SITE_OTHER): Payer: Self-pay | Admitting: Orthopedic Surgery

## 2017-01-15 VITALS — BP 93/68 | HR 76 | Ht 65.0 in | Wt 178.0 lb

## 2017-01-15 DIAGNOSIS — M1712 Unilateral primary osteoarthritis, left knee: Secondary | ICD-10-CM | POA: Diagnosis not present

## 2017-01-15 NOTE — Progress Notes (Deleted)
   Office Visit Note   Patient: [name] [name]           Date of Birth: 1966/01/26           MRN: [id number] Visit Date: 01/15/2017              Requested by: No referring provider defined for this encounter. PCP: No PCP Per Patient   Assessment & Plan: Visit Diagnoses: No diagnosis found.  Plan: ***  Follow-Up Instructions: No Follow-up on file.   Orders:  No orders of the defined types were placed in this encounter.  No orders of the defined types were placed in this encounter.     Procedures: No procedures performed   Clinical Data: No additional findings.   Subjective: Chief Complaint  Patient presents with  . Left Knee - Pre-op Exam    Pt here today for Total left knee arthroscopy for surgery on 01/21/17.      Review of Systems   Objective: Vital Signs: LMP 01/23/2015 Comment: perimenopausal (very irregular), no chance pregnant  Physical Exam  Ortho Exam  Specialty Comments:  No specialty comments available.  Imaging: No results found.   PMFS History: Patient Active Problem List   Diagnosis Date Noted  . Chondromalacia patellae, left knee 01/02/2017  . Torn meniscus 06/29/2015  . Osteochondral defect of femoral condyle 06/29/2015  . Plantar fasciitis of right foot 09/27/2014   Past Medical History:  Diagnosis Date  . Anxiety   . Arthritis   . Bell's palsy 2016   left side   . Cancer ([place])    skin cancer- left temporal " sitting on bone"  . Depression   . Hypercholesteremia   . PONV (postoperative nausea and vomiting)    would like scope patch    No family history on file.  Past Surgical History:  Procedure Laterality Date  . CHONDROPLASTY Left 06/29/2015   Procedure: CHONDROPLASTY;  Surgeon: Garald Balding, MD;  Location: [place];  Service: Orthopedics;  Laterality: Left;  . FOOT SURGERY Bilateral    Bone Spurs  . KNEE ARTHROSCOPY WITH DRILLING/MICROFRACTURE Left 06/29/2015   Procedure: KNEE ARTHROSCOPY WITH  DRILLING/MICROFRACTURE;  Surgeon: Garald Balding, MD;  Location: [place];  Service: Orthopedics;  Laterality: Left;  . KNEE ARTHROSCOPY WITH LATERAL MENISECTOMY Left 06/29/2015   Procedure: KNEE ARTHROSCOPY WITH LATERAL MENISECTOMY;  Surgeon: Garald Balding, MD;  Location: [place];  Service: Orthopedics;  Laterality: Left;  . KNEE ARTHROSCOPY WITH LATERAL RELEASE Left 06/29/2015   Procedure: KNEE ARTHROSCOPY WITH LATERAL RELEASE;  Surgeon: Garald Balding, MD;  Location: [place];  Service: Orthopedics;  Laterality: Left;  . KNEE SURGERY    . [place] Left    face  . PLANTAR FASCIA RELEASE Right 09/27/2014   Procedure: PLANTAR FASCIA RELEASE;  Surgeon: Garald Balding, MD;  Location: [place];  Service: Orthopedics;  Laterality: Right;  . TONSILLECTOMY    . Tummy tuck     2010   Social History   Occupational History  . Not on file.   Social History Main Topics  . Smoking status: Never Smoker  . Smokeless tobacco: Never Used  . Alcohol use No  . Drug use: No  . Sexual activity: Not Currently

## 2017-01-15 NOTE — H&P (Signed)
[name] Fears, MD   [name] Borg, PA-C [address] [place], [place]  [address]                             [phone]   ORTHOPAEDIC HISTORY & PHYSICAL  [name] [name] MRN:  [id number] DOB/SEX:  1966-12-13/female  CHIEF COMPLAINT:  Painful left Knee  HISTORY: Patient is a 51 y.o. female presented with a history of pain in the left knee for 7 years. Onset of symptoms was gradual starting 6 years ago with gradually worsening course since that time. Prior procedures on the knee are arthroscopy. Patient has been treated conservatively with over-the-counter NSAIDs and activity modification. Patient currently rates pain in the knee at 8 out of 10 with activity. There is pain at night. present.  They have been previously treated with: NSAIDS: NSAID with mild improvement  Knee injection with corticosteroid  was performed Knee injection with visco supplementation was not performed Medications: NSAID with no improvement  PAST MEDICAL HISTORY: Patient Active Problem List   Diagnosis Date Noted  . Chondromalacia patellae, left knee 01/02/2017  . Torn meniscus 06/29/2015  . Osteochondral defect of femoral condyle 06/29/2015  . Plantar fasciitis of right foot 09/27/2014   Past Medical History:  Diagnosis Date  . Anxiety   . Arthritis   . Bell's palsy 2016   left side   . Cancer ([place])    skin cancer- left temporal " sitting on bone"  . Depression   . Hypercholesteremia   . PONV (postoperative nausea and vomiting)    would like scope patch   Past Surgical History:  Procedure Laterality Date  . CHONDROPLASTY Left 06/29/2015   Procedure: CHONDROPLASTY;  Surgeon: [name] Balding, MD;  Location: [facility];  Service: Orthopedics;  Laterality: Left;  . FOOT SURGERY Bilateral    Bone Spurs  . KNEE ARTHROSCOPY WITH DRILLING/MICROFRACTURE Left 06/29/2015   Procedure: KNEE ARTHROSCOPY WITH DRILLING/MICROFRACTURE;  Surgeon: [name] Balding, MD;  Location: [facility];  Service: Orthopedics;  Laterality: Left;  . KNEE ARTHROSCOPY WITH LATERAL MENISECTOMY Left 06/29/2015   Procedure: KNEE ARTHROSCOPY WITH LATERAL MENISECTOMY;  Surgeon: [name] Balding, MD;  Location: [facility];  Service: Orthopedics;  Laterality: Left;  . KNEE ARTHROSCOPY WITH LATERAL RELEASE Left 06/29/2015   Procedure: KNEE ARTHROSCOPY WITH LATERAL RELEASE;  Surgeon: [name] Balding, MD;  Location: [facility];  Service: Orthopedics;  Laterality: Left;  . KNEE SURGERY    . [place] Left    face  . PLANTAR FASCIA RELEASE Right 09/27/2014   Procedure: PLANTAR FASCIA RELEASE;  Surgeon: [name] Balding, MD;  Location: [facility];  Service: Orthopedics;  Laterality: Right;  . TONSILLECTOMY    . Tummy tuck     2010     MEDICATIONS PRIOR TO ADMISSION: Prior to Admission medications   Medication Sig Start Date End Date Taking? Authorizing Provider  clonazePAM (KLONOPIN) 1 MG tablet Take 1 mg by mouth 4 (four) times daily as needed for anxiety.  11/28/16   Historical Provider, MD  desvenlafaxine (PRISTIQ) 100 MG 24 hr tablet Take 100 mg by mouth daily. 12/09/16   Historical Provider, MD  ibuprofen (ADVIL,MOTRIN) 800 MG tablet Take 800 mg by mouth every 8 (eight) hours as needed (for pain).     Historical Provider, MD  traZODone (DESYREL) 100 MG tablet Take 100 mg by mouth at bedtime as needed  for sleep. 12/19/16   Historical Provider, MD     ALLERGIES:   Allergies  Allergen Reactions  . Decadron [Dexamethasone Sodium Phosphate] Anaphylaxis    REVIEW OF SYSTEMS:  Review of Systems  All other systems reviewed and are negative.   FAMILY HISTORY:  History reviewed. No pertinent family history.  SOCIAL HISTORY:   Social History   Occupational History  . Not on file.   Social History Main Topics  . Smoking status: Never Smoker  . Smokeless tobacco: Never Used  . Alcohol use No  . Drug use: No  . Sexual activity: Not  Currently     EXAMINATION:  Vital signs in last 24 hours: BP 93/68   Pulse 76   Ht 5\' 5"  (1.651 m)   Wt 178 lb (80.7 kg)   LMP 01/23/2015 Comment: perimenopausal (very irregular), no chance pregnant  BMI 29.62 kg/m   Physical Exam  Constitutional: She is oriented to person, place, and time. She appears well-developed and well-nourished.  HENT:  Head: Normocephalic and atraumatic.  Eyes: Conjunctivae and EOM are normal. Pupils are equal, round, and reactive to light.  Neck: Neck supple.  No carotid bruits  Cardiovascular: Normal rate, regular rhythm, normal heart sounds and intact distal pulses.   Pulmonary/Chest: Effort normal and breath sounds normal.  Abdominal: Soft. Bowel sounds are normal. There is no tenderness.  Musculoskeletal:       Left knee: She exhibits effusion (trace).  Neurological: She is alert and oriented to person, place, and time.  Skin: Skin is warm and dry.  Psychiatric: She has a normal mood and affect. Her behavior is normal. Judgment and thought content normal.   Left Knee Exam   Tenderness  Left knee tenderness location: Diffuse tenderness about the knee.  Range of Motion  Extension: 0  Flexion: 110   Other  Sensation: normal Effusion: effusion (trace) present  Comments:  Crepitus with ROM      Imaging Review Plain radiographs demonstrate moderate patello-femoral and mild tib-femoral degenerative joint disease of the left knee. The overall alignment is neutral. The bone quality appears to be good for age and reported activity level.  ASSESSMENT: End stage arthritis, left knee  Past Medical History:  Diagnosis Date  . Anxiety   . Arthritis   . Bell's palsy 2016   left side   . 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 Depression   . Hypercholesteremia   . PONV (postoperative nausea and vomiting)    would like scope patch    PLAN: Plan for left total knee replacement.  The patient history, physical  examination and imaging studies are consistent with moderate degenerative joint disease of the left knee. The patient has failed conservative treatment.  The clearance notes were reviewed.  After discussion with the patient it was felt that Total Knee Replacement was indicated. The procedure,  risks, and benefits of total knee arthroplasty were presented and reviewed. The risks including but not limited to aseptic loosening, infection, blood clots, vascular and nerve injury, stiffness, patella tracking problems and fracture complications among others were discussed. The patient acknowledged the explanation, agreed to proceed with total knee replacement.  Time spent with patient was 75 minutes. 50% was discussion of treatment plan and postoperative course  [name] [name] 01/15/2017, 12:16 PM

## 2017-01-20 MED ORDER — TRANEXAMIC ACID 1000 MG/10ML IV SOLN
2000.0000 mg | INTRAVENOUS | Status: DC
  Filled 2017-01-20: qty 20

## 2017-01-21 ENCOUNTER — Encounter (HOSPITAL_COMMUNITY): Payer: Self-pay | Admitting: Certified Registered"

## 2017-01-21 ENCOUNTER — Inpatient Hospital Stay (HOSPITAL_COMMUNITY): Payer: BLUE CROSS/BLUE SHIELD | Admitting: Certified Registered"

## 2017-01-21 ENCOUNTER — Encounter (HOSPITAL_COMMUNITY)
Admission: RE | Disposition: A | Discharge status: Home Health | Payer: Self-pay | Source: Ambulatory Visit | Attending: Orthopaedic Surgery

## 2017-01-21 ENCOUNTER — Inpatient Hospital Stay (HOSPITAL_COMMUNITY)
Admission: RE | Admit: 2017-01-21 | Discharge: 2017-01-24 | DRG: 470 | Disposition: A | Discharge status: Home Health | Payer: BLUE CROSS/BLUE SHIELD | Source: Ambulatory Visit | Attending: Orthopaedic Surgery | Admitting: Orthopaedic Surgery

## 2017-01-21 DIAGNOSIS — Z85828 Personal history of other malignant neoplasm of skin: Secondary | ICD-10-CM

## 2017-01-21 DIAGNOSIS — M1712 Unilateral primary osteoarthritis, left knee: Principal | ICD-10-CM | POA: Diagnosis present

## 2017-01-21 DIAGNOSIS — R509 Fever, unspecified: Secondary | ICD-10-CM

## 2017-01-21 DIAGNOSIS — M1990 Unspecified osteoarthritis, unspecified site: Secondary | ICD-10-CM | POA: Diagnosis present

## 2017-01-21 DIAGNOSIS — Z01818 Encounter for other preprocedural examination: Secondary | ICD-10-CM

## 2017-01-21 DIAGNOSIS — F329 Major depressive disorder, single episode, unspecified: Secondary | ICD-10-CM | POA: Diagnosis present

## 2017-01-21 DIAGNOSIS — F419 Anxiety disorder, unspecified: Secondary | ICD-10-CM | POA: Diagnosis present

## 2017-01-21 DIAGNOSIS — D62 Acute posthemorrhagic anemia: Secondary | ICD-10-CM | POA: Diagnosis not present

## 2017-01-21 DIAGNOSIS — E7800 Pure hypercholesterolemia, unspecified: Secondary | ICD-10-CM | POA: Diagnosis present

## 2017-01-21 DIAGNOSIS — Z96652 Presence of left artificial knee joint: Secondary | ICD-10-CM

## 2017-01-21 DIAGNOSIS — X58XXXA Exposure to other specified factors, initial encounter: Secondary | ICD-10-CM | POA: Diagnosis present

## 2017-01-21 DIAGNOSIS — S82142A Displaced bicondylar fracture of left tibia, initial encounter for closed fracture: Secondary | ICD-10-CM | POA: Diagnosis present

## 2017-01-21 HISTORY — PX: TOTAL KNEE ARTHROPLASTY: SHX125

## 2017-01-21 LAB — CBC WITH DIFFERENTIAL/PLATELET
BASOS ABS: 0 10*3/uL (ref 0.0–0.1)
Basophils Relative: 1 %
EOS PCT: 4 %
Eosinophils Absolute: 0.3 10*3/uL (ref 0.0–0.7)
HCT: 39.9 % (ref 36.0–46.0)
Hemoglobin: 13.9 g/dL (ref 12.0–15.0)
LYMPHS PCT: 41 %
Lymphs Abs: 2.5 10*3/uL (ref 0.7–4.0)
MCH: 33.3 pg (ref 26.0–34.0)
MCHC: 34.8 g/dL (ref 30.0–36.0)
MCV: 95.5 fL (ref 78.0–100.0)
Monocytes Absolute: 0.6 10*3/uL (ref 0.1–1.0)
Monocytes Relative: 10 %
Neutro Abs: 2.8 10*3/uL (ref 1.7–7.7)
Neutrophils Relative %: 44 %
PLATELETS: 256 10*3/uL (ref 150–400)
RBC: 4.18 MIL/uL (ref 3.87–5.11)
RDW: 11.8 % (ref 11.5–15.5)
WBC: 6.2 10*3/uL (ref 4.0–10.5)

## 2017-01-21 LAB — COMPREHENSIVE METABOLIC PANEL
ALT: 19 U/L (ref 14–54)
AST: 28 U/L (ref 15–41)
Albumin: 4 g/dL (ref 3.5–5.0)
Alkaline Phosphatase: 74 U/L (ref 38–126)
Anion gap: 7 (ref 5–15)
BUN: 11 mg/dL (ref 6–20)
CO2: 24 mmol/L (ref 22–32)
CREATININE: 0.87 mg/dL (ref 0.44–1.00)
Calcium: 9.7 mg/dL (ref 8.9–10.3)
Chloride: 109 mmol/L (ref 101–111)
GFR calc Af Amer: >60 mL/min (ref >60)
GLUCOSE: 93 mg/dL (ref 65–99)
Potassium: 3.7 mmol/L (ref 3.5–5.1)
SODIUM: 140 mmol/L (ref 135–145)
Total Bilirubin: 0.6 mg/dL (ref 0.3–1.2)
Total Protein: 7.4 g/dL (ref 6.5–8.1)

## 2017-01-21 LAB — HCG, SERUM, QUALITATIVE: PREG SERUM: NEGATIVE

## 2017-01-21 LAB — PROTIME-INR
INR: 0.93
Prothrombin Time: 12.5 seconds (ref 11.4–15.2)

## 2017-01-21 LAB — TYPE AND SCREEN
ABO/RH(D): O NEG
ANTIBODY SCREEN: NEGATIVE

## 2017-01-21 LAB — APTT: aPTT: 28 seconds (ref 24–36)

## 2017-01-21 LAB — ABO/RH: ABO/RH(D): O NEG

## 2017-01-21 SURGERY — ARTHROPLASTY, KNEE, TOTAL
Anesthesia: Monitor Anesthesia Care | Site: Knee | Laterality: Left

## 2017-01-21 MED ORDER — SCOPOLAMINE 1 MG/3DAYS TD PT72
1.0000 | MEDICATED_PATCH | TRANSDERMAL | Status: DC
  Filled 2017-01-21: qty 1

## 2017-01-21 MED ORDER — METHOCARBAMOL 500 MG PO TABS
500.0000 mg | ORAL_TABLET | Freq: Four times a day (QID) | ORAL | Status: DC | PRN
  Administered 2017-01-21 – 2017-01-24 (×11): 500 mg via ORAL
  Filled 2017-01-21 (×11): qty 1

## 2017-01-21 MED ORDER — CLONAZEPAM 1 MG PO TABS
1.0000 mg | ORAL_TABLET | Freq: Every day | ORAL | Status: DC | PRN
  Administered 2017-01-22: 1 mg via ORAL
  Administered 2017-01-23 (×2): 2 mg via ORAL
  Administered 2017-01-24: 1 mg via ORAL
  Filled 2017-01-21: qty 1
  Filled 2017-01-21 (×2): qty 2
  Filled 2017-01-21: qty 1

## 2017-01-21 MED ORDER — DOCUSATE SODIUM 100 MG PO CAPS
100.0000 mg | ORAL_CAPSULE | Freq: Two times a day (BID) | ORAL | Status: DC
  Administered 2017-01-21 – 2017-01-24 (×6): 100 mg via ORAL
  Filled 2017-01-21 (×7): qty 1

## 2017-01-21 MED ORDER — MIDAZOLAM HCL 2 MG/2ML IJ SOLN
0.5000 mg | Freq: Once | INTRAMUSCULAR | Status: AC
  Administered 2017-01-21: 2 mg via INTRAVENOUS

## 2017-01-21 MED ORDER — ALUM & MAG HYDROXIDE-SIMETH 200-200-20 MG/5ML PO SUSP
30.0000 mL | ORAL | Status: DC | PRN
  Administered 2017-01-23: 30 mL via ORAL
  Filled 2017-01-21: qty 30

## 2017-01-21 MED ORDER — EPHEDRINE 5 MG/ML INJ
INTRAVENOUS | Status: AC
  Filled 2017-01-21: qty 10

## 2017-01-21 MED ORDER — SODIUM CHLORIDE 0.9 % IV SOLN
INTRAVENOUS | Status: DC
  Administered 2017-01-21: 22:00:00 via INTRAVENOUS

## 2017-01-21 MED ORDER — ONDANSETRON HCL 4 MG PO TABS
4.0000 mg | ORAL_TABLET | Freq: Four times a day (QID) | ORAL | Status: DC | PRN
  Administered 2017-01-22 – 2017-01-23 (×3): 4 mg via ORAL
  Filled 2017-01-21 (×3): qty 1

## 2017-01-21 MED ORDER — PROPOFOL 500 MG/50ML IV EMUL
INTRAVENOUS | Status: DC | PRN
  Administered 2017-01-21: 100 ug/kg/min via INTRAVENOUS

## 2017-01-21 MED ORDER — HYDROMORPHONE HCL 2 MG/ML IJ SOLN
0.5000 mg | INTRAMUSCULAR | Status: DC | PRN
  Administered 2017-01-22 – 2017-01-23 (×5): 0.5 mg via INTRAVENOUS
  Filled 2017-01-21 (×5): qty 1

## 2017-01-21 MED ORDER — PROMETHAZINE HCL 25 MG/ML IJ SOLN
INTRAMUSCULAR | Status: AC
  Administered 2017-01-21: 6.25 mg via INTRAVENOUS
  Filled 2017-01-21: qty 1

## 2017-01-21 MED ORDER — CEFAZOLIN SODIUM-DEXTROSE 2-4 GM/100ML-% IV SOLN
2.0000 g | INTRAVENOUS | Status: AC
  Administered 2017-01-21: 2 g via INTRAVENOUS
  Filled 2017-01-21: qty 100

## 2017-01-21 MED ORDER — FENTANYL CITRATE (PF) 100 MCG/2ML IJ SOLN
INTRAMUSCULAR | Status: AC
  Administered 2017-01-21: 100 ug via INTRAVENOUS
  Filled 2017-01-21: qty 2

## 2017-01-21 MED ORDER — RIVAROXABAN 10 MG PO TABS
10.0000 mg | ORAL_TABLET | Freq: Every day | ORAL | Status: DC
  Administered 2017-01-22 – 2017-01-24 (×3): 10 mg via ORAL
  Filled 2017-01-21 (×3): qty 1

## 2017-01-21 MED ORDER — POLYETHYLENE GLYCOL 3350 17 G PO PACK: 17.0000 g | PACK | Freq: Every day | ORAL | Status: DC | PRN

## 2017-01-21 MED ORDER — METHOCARBAMOL 1000 MG/10ML IJ SOLN
500.0000 mg | Freq: Four times a day (QID) | INTRAVENOUS | Status: DC | PRN
  Filled 2017-01-21: qty 5

## 2017-01-21 MED ORDER — CHLORHEXIDINE GLUCONATE 4 % EX LIQD: 60.0000 mL | Freq: Once | CUTANEOUS | Status: DC

## 2017-01-21 MED ORDER — MIDAZOLAM HCL 2 MG/2ML IJ SOLN
2.0000 mg | Freq: Once | INTRAMUSCULAR | Status: AC
  Administered 2017-01-21: 2 mg via INTRAVENOUS

## 2017-01-21 MED ORDER — SCOPOLAMINE 1 MG/3DAYS TD PT72
1.0000 | MEDICATED_PATCH | TRANSDERMAL | Status: DC
  Administered 2017-01-21: 1.5 mg via TRANSDERMAL

## 2017-01-21 MED ORDER — PROPOFOL 10 MG/ML IV BOLUS
INTRAVENOUS | Status: DC | PRN
  Administered 2017-01-21: 20 mg via INTRAVENOUS
  Administered 2017-01-21: 10 mg via INTRAVENOUS
  Administered 2017-01-21: 20 mg via INTRAVENOUS
  Administered 2017-01-21: 10 mg via INTRAVENOUS
  Administered 2017-01-21 (×2): 20 mg via INTRAVENOUS

## 2017-01-21 MED ORDER — KETOROLAC TROMETHAMINE 15 MG/ML IJ SOLN
15.0000 mg | Freq: Four times a day (QID) | INTRAMUSCULAR | Status: AC
  Administered 2017-01-21 – 2017-01-22 (×4): 15 mg via INTRAVENOUS
  Filled 2017-01-21 (×4): qty 1

## 2017-01-21 MED ORDER — METOCLOPRAMIDE HCL 5 MG PO TABS: 5.0000 mg | ORAL_TABLET | Freq: Three times a day (TID) | ORAL | Status: DC | PRN

## 2017-01-21 MED ORDER — SODIUM CHLORIDE 0.9 % IV SOLN: INTRAVENOUS | Status: DC

## 2017-01-21 MED ORDER — OXYCODONE HCL 5 MG PO TABS
ORAL_TABLET | ORAL | Status: AC
Start: 2017-01-21 — End: 2017-01-22
  Filled 2017-01-21: qty 2

## 2017-01-21 MED ORDER — HYDROMORPHONE HCL 1 MG/ML IJ SOLN
0.2500 mg | INTRAMUSCULAR | Status: DC | PRN
  Administered 2017-01-21 (×4): 0.5 mg via INTRAVENOUS

## 2017-01-21 MED ORDER — MIDAZOLAM HCL 2 MG/2ML IJ SOLN
INTRAMUSCULAR | Status: AC
  Administered 2017-01-21: 2 mg via INTRAVENOUS
  Filled 2017-01-21: qty 2

## 2017-01-21 MED ORDER — HYDROMORPHONE HCL 1 MG/ML IJ SOLN
INTRAMUSCULAR | Status: AC
  Administered 2017-01-21: 0.5 mg via INTRAVENOUS
  Filled 2017-01-21: qty 0.5

## 2017-01-21 MED ORDER — PHENYLEPHRINE 40 MCG/ML (10ML) SYRINGE FOR IV PUSH (FOR BLOOD PRESSURE SUPPORT)
PREFILLED_SYRINGE | INTRAVENOUS | Status: AC
  Filled 2017-01-21: qty 10

## 2017-01-21 MED ORDER — MAGNESIUM CITRATE PO SOLN: 1.0000 | Freq: Once | ORAL | Status: DC | PRN

## 2017-01-21 MED ORDER — BUPIVACAINE HCL (PF) 0.25 % IJ SOLN
INTRAMUSCULAR | Status: AC
  Filled 2017-01-21: qty 30

## 2017-01-21 MED ORDER — PROPOFOL 500 MG/50ML IV EMUL
INTRAVENOUS | Status: AC
  Filled 2017-01-21: qty 50

## 2017-01-21 MED ORDER — 0.9 % SODIUM CHLORIDE (POUR BTL) OPTIME
TOPICAL | Status: DC | PRN
  Administered 2017-01-21: 1000 mL

## 2017-01-21 MED ORDER — LACTATED RINGERS IV SOLN
INTRAVENOUS | Status: DC
  Administered 2017-01-21 (×2): via INTRAVENOUS

## 2017-01-21 MED ORDER — ACETAMINOPHEN 10 MG/ML IV SOLN
1000.0000 mg | Freq: Once | INTRAVENOUS | Status: AC
  Administered 2017-01-21: 1000 mg via INTRAVENOUS
  Filled 2017-01-21: qty 100

## 2017-01-21 MED ORDER — METOCLOPRAMIDE HCL 5 MG/ML IJ SOLN: 5.0000 mg | Freq: Three times a day (TID) | INTRAMUSCULAR | Status: DC | PRN

## 2017-01-21 MED ORDER — KETOROLAC TROMETHAMINE 15 MG/ML IJ SOLN
INTRAMUSCULAR | Status: AC
  Filled 2017-01-21: qty 1

## 2017-01-21 MED ORDER — SODIUM CHLORIDE 0.9 % IR SOLN
Status: DC | PRN
  Administered 2017-01-21: 3000 mL

## 2017-01-21 MED ORDER — FENTANYL CITRATE (PF) 100 MCG/2ML IJ SOLN
100.0000 ug | Freq: Once | INTRAMUSCULAR | Status: AC
  Administered 2017-01-21: 100 ug via INTRAVENOUS

## 2017-01-21 MED ORDER — TRAZODONE HCL 100 MG PO TABS
100.0000 mg | ORAL_TABLET | Freq: Every evening | ORAL | Status: DC | PRN
  Administered 2017-01-22: 100 mg via ORAL
  Filled 2017-01-21: qty 1

## 2017-01-21 MED ORDER — ONDANSETRON HCL 4 MG/2ML IJ SOLN
4.0000 mg | Freq: Four times a day (QID) | INTRAMUSCULAR | Status: DC | PRN
  Administered 2017-01-23: 4 mg via INTRAVENOUS
  Filled 2017-01-21: qty 2

## 2017-01-21 MED ORDER — HYDROMORPHONE HCL 1 MG/ML IJ SOLN
INTRAMUSCULAR | Status: AC
  Administered 2017-01-21: 0.5 mg via INTRAVENOUS
  Filled 2017-01-21: qty 1

## 2017-01-21 MED ORDER — BISACODYL 10 MG RE SUPP
10.0000 mg | Freq: Every day | RECTAL | Status: DC | PRN
Start: 2017-01-21 — End: 2017-01-24

## 2017-01-21 MED ORDER — EPHEDRINE SULFATE-NACL 50-0.9 MG/10ML-% IV SOSY
PREFILLED_SYRINGE | INTRAVENOUS | Status: DC | PRN
  Administered 2017-01-21 (×5): 10 mg via INTRAVENOUS

## 2017-01-21 MED ORDER — METHOCARBAMOL 500 MG PO TABS
ORAL_TABLET | ORAL | Status: AC
  Administered 2017-01-21: 500 mg via ORAL
  Filled 2017-01-21: qty 1

## 2017-01-21 MED ORDER — DIPHENHYDRAMINE HCL 12.5 MG/5ML PO ELIX
12.5000 mg | ORAL_SOLUTION | ORAL | Status: DC | PRN
  Administered 2017-01-21 – 2017-01-23 (×5): 25 mg via ORAL
  Filled 2017-01-21 (×6): qty 10

## 2017-01-21 MED ORDER — PHENOL 1.4 % MT LIQD: 1.0000 | OROMUCOSAL | Status: DC | PRN

## 2017-01-21 MED ORDER — PHENYLEPHRINE 40 MCG/ML (10ML) SYRINGE FOR IV PUSH (FOR BLOOD PRESSURE SUPPORT)
PREFILLED_SYRINGE | INTRAVENOUS | Status: DC | PRN
  Administered 2017-01-21 (×2): 120 ug via INTRAVENOUS
  Administered 2017-01-21 (×2): 80 ug via INTRAVENOUS

## 2017-01-21 MED ORDER — ACETAMINOPHEN 10 MG/ML IV SOLN
1000.0000 mg | Freq: Four times a day (QID) | INTRAVENOUS | Status: AC
  Administered 2017-01-21 – 2017-01-22 (×4): 1000 mg via INTRAVENOUS
  Filled 2017-01-21 (×4): qty 100

## 2017-01-21 MED ORDER — VENLAFAXINE HCL ER 150 MG PO CP24
150.0000 mg | ORAL_CAPSULE | Freq: Every day | ORAL | Status: DC
  Administered 2017-01-22 – 2017-01-24 (×3): 150 mg via ORAL
  Filled 2017-01-21 (×3): qty 1

## 2017-01-21 MED ORDER — MENTHOL 3 MG MT LOZG: 1.0000 | LOZENGE | OROMUCOSAL | Status: DC | PRN

## 2017-01-21 MED ORDER — ROPIVACAINE HCL 7.5 MG/ML IJ SOLN
INTRAMUSCULAR | Status: DC | PRN
  Administered 2017-01-21: 20 mL via PERINEURAL

## 2017-01-21 MED ORDER — OXYCODONE HCL 5 MG PO TABS
5.0000 mg | ORAL_TABLET | ORAL | Status: DC | PRN
  Administered 2017-01-21 – 2017-01-22 (×4): 10 mg via ORAL
  Administered 2017-01-22: 5 mg via ORAL
  Administered 2017-01-22 – 2017-01-24 (×12): 10 mg via ORAL
  Filled 2017-01-21 (×16): qty 2

## 2017-01-21 MED ORDER — PROMETHAZINE HCL 25 MG/ML IJ SOLN
6.2500 mg | INTRAMUSCULAR | Status: DC | PRN
  Administered 2017-01-21: 6.25 mg via INTRAVENOUS

## 2017-01-21 MED ORDER — CEFAZOLIN SODIUM-DEXTROSE 2-4 GM/100ML-% IV SOLN
2.0000 g | Freq: Four times a day (QID) | INTRAVENOUS | Status: AC
  Administered 2017-01-21 – 2017-01-22 (×2): 2 g via INTRAVENOUS
  Filled 2017-01-21 (×2): qty 100

## 2017-01-21 MED ORDER — HYDROMORPHONE HCL 1 MG/ML IJ SOLN
0.5000 mg | INTRAMUSCULAR | Status: DC | PRN
  Administered 2017-01-21: 0.5 mg via INTRAVENOUS

## 2017-01-21 SURGICAL SUPPLY — 66 items
BAG DECANTER FOR FLEXI CONT (MISCELLANEOUS) ×3 IMPLANT
BANDAGE ESMARK 6X9 LF (GAUZE/BANDAGES/DRESSINGS) ×1 IMPLANT
BIT DRILL 110X2.5XQCK CNCT (BIT) IMPLANT
BIT DRILL 2.5 (BIT) ×3
BIT DRL 110X2.5XQCK CNCT (BIT) ×1
BLADE SAGITTAL 25.0X1.19X90 (BLADE) ×2 IMPLANT
BLADE SAGITTAL 25.0X1.19X90MM (BLADE) ×1
BNDG CMPR 9X6 STRL LF SNTH (GAUZE/BANDAGES/DRESSINGS) ×1
BNDG ESMARK 6X9 LF (GAUZE/BANDAGES/DRESSINGS) ×3
BOWL SMART MIX CTS (DISPOSABLE) ×3 IMPLANT
CAP KNEE TOTAL 3 SIGMA ×2 IMPLANT
CEMENT HV SMART SET (Cement) ×6 IMPLANT
COVER SURGICAL LIGHT HANDLE (MISCELLANEOUS) ×3 IMPLANT
CUFF TOURNIQUET SINGLE 34IN LL (TOURNIQUET CUFF) IMPLANT
CUFF TOURNIQUET SINGLE 44IN (TOURNIQUET CUFF) IMPLANT
DECANTER SPIKE VIAL GLASS SM (MISCELLANEOUS) ×3 IMPLANT
DRAPE EXTREMITY T 121X128X90 (DRAPE) IMPLANT
DRAPE HALF SHEET 40X57 (DRAPES) ×3 IMPLANT
DRAPE PROXIMA HALF (DRAPES) ×3 IMPLANT
DRSG ADAPTIC 3X8 NADH LF (GAUZE/BANDAGES/DRESSINGS) ×3 IMPLANT
DRSG AQUACEL AG ADV 3.5X14 (GAUZE/BANDAGES/DRESSINGS) ×2 IMPLANT
DRSG PAD ABDOMINAL 8X10 ST (GAUZE/BANDAGES/DRESSINGS) ×6 IMPLANT
DURAPREP 26ML APPLICATOR (WOUND CARE) ×6 IMPLANT
ELECT CAUTERY BLADE 6.4 (BLADE) ×3 IMPLANT
ELECT REM PT RETURN 9FT ADLT (ELECTROSURGICAL) ×3
ELECTRODE REM PT RTRN 9FT ADLT (ELECTROSURGICAL) ×1 IMPLANT
EVACUATOR 1/8 PVC DRAIN (DRAIN) IMPLANT
FACESHIELD WRAPAROUND (MASK) ×6 IMPLANT
FACESHIELD WRAPAROUND OR TEAM (MASK) ×2 IMPLANT
GAUZE SPONGE 4X4 12PLY STRL (GAUZE/BANDAGES/DRESSINGS) ×3 IMPLANT
GLOVE BIOGEL PI IND STRL 8 (GLOVE) ×1 IMPLANT
GLOVE BIOGEL PI IND STRL 8.5 (GLOVE) ×1 IMPLANT
GLOVE BIOGEL PI INDICATOR 8 (GLOVE) ×2
GLOVE BIOGEL PI INDICATOR 8.5 (GLOVE) ×2
GLOVE ECLIPSE 8.0 STRL XLNG CF (GLOVE) ×6 IMPLANT
GLOVE SURG ORTHO 8.5 STRL (GLOVE) ×6 IMPLANT
GOWN STRL REUS W/ TWL LRG LVL3 (GOWN DISPOSABLE) ×2 IMPLANT
GOWN STRL REUS W/TWL 2XL LVL3 (GOWN DISPOSABLE) ×3 IMPLANT
GOWN STRL REUS W/TWL LRG LVL3 (GOWN DISPOSABLE) ×6
HANDPIECE INTERPULSE COAX TIP (DISPOSABLE) ×3
KIT BASIN OR (CUSTOM PROCEDURE TRAY) ×3 IMPLANT
KIT ROOM TURNOVER OR (KITS) ×3 IMPLANT
MANIFOLD NEPTUNE II (INSTRUMENTS) ×3 IMPLANT
NEEDLE 22X1 1/2 (OR ONLY) (NEEDLE) ×3 IMPLANT
NS IRRIG 1000ML POUR BTL (IV SOLUTION) ×3 IMPLANT
PACK TOTAL JOINT (CUSTOM PROCEDURE TRAY) ×3 IMPLANT
PAD ARMBOARD 7.5X6 YLW CONV (MISCELLANEOUS) ×6 IMPLANT
PAD CAST 4YDX4 CTTN HI CHSV (CAST SUPPLIES) ×1 IMPLANT
PADDING CAST COTTON 4X4 STRL (CAST SUPPLIES) ×3
PADDING CAST COTTON 6X4 STRL (CAST SUPPLIES) ×3 IMPLANT
SCREW CANN 4.0X30MM PT (Rod) ×2 IMPLANT
SET HNDPC FAN SPRY TIP SCT (DISPOSABLE) ×1 IMPLANT
STAPLER VISISTAT 35W (STAPLE) ×3 IMPLANT
SUCTION FRAZIER HANDLE 10FR (MISCELLANEOUS) ×2
SUCTION TUBE FRAZIER 10FR DISP (MISCELLANEOUS) ×1 IMPLANT
SURGIFLO W/THROMBIN 8M KIT (HEMOSTASIS) IMPLANT
SUT BONE WAX W31G (SUTURE) ×3 IMPLANT
SUT ETHIBOND NAB CT1 #1 30IN (SUTURE) ×6 IMPLANT
SUT MNCRL AB 3-0 PS2 18 (SUTURE) ×3 IMPLANT
SUT VIC AB 0 CT1 27 (SUTURE) ×3
SUT VIC AB 0 CT1 27XBRD ANBCTR (SUTURE) ×1 IMPLANT
SYR CONTROL 10ML LL (SYRINGE) IMPLANT
TOWEL OR 17X24 6PK STRL BLUE (TOWEL DISPOSABLE) ×3 IMPLANT
TOWEL OR 17X26 10 PK STRL BLUE (TOWEL DISPOSABLE) ×3 IMPLANT
TRAY FOLEY BAG SILVER LF 16FR (SET/KITS/TRAYS/PACK) ×3 IMPLANT
WRAP KNEE MAXI GEL POST OP (GAUZE/BANDAGES/DRESSINGS) ×3 IMPLANT

## 2017-01-21 NOTE — Progress Notes (Signed)
Patient understands our policy regarding patient belongings and patient is ok with her personal belongings, wallet and cell phone goes to PACU.  Patient has 20$ cash and one credit card

## 2017-01-21 NOTE — Anesthesia Preprocedure Evaluation (Addendum)
Anesthesia Evaluation  Patient identified by MRN, date of birth, ID band Patient awake    Reviewed: Allergy & Precautions, NPO status , Patient's Chart, lab work & pertinent test results  History of Anesthesia Complications (+) PONV and history of anesthetic complications  Airway Mallampati: II  TM Distance: >3 FB Neck ROM: Full    Dental no notable dental hx. (+) Dental Advisory Given, Caps   Pulmonary neg pulmonary ROS,    Pulmonary exam normal        Cardiovascular negative cardio ROS Normal cardiovascular exam     Neuro/Psych PSYCHIATRIC DISORDERS Anxiety Depression negative neurological ROS     GI/Hepatic negative GI ROS, Neg liver ROS,   Endo/Other  negative endocrine ROS  Renal/GU negative Renal ROS     Musculoskeletal   Abdominal   Peds  Hematology   Anesthesia Other Findings   Reproductive/Obstetrics                            Anesthesia Physical Anesthesia Plan  ASA: II  Anesthesia Plan: MAC and Spinal   Post-op Pain Management:  Regional for Post-op pain   Induction:   Airway Management Planned: Natural Airway  Additional Equipment:   Intra-op Plan:   Post-operative Plan: Extubation in OR  Informed Consent: I have reviewed the patients History and Physical, chart, labs and discussed the procedure including the risks, benefits and alternatives for the proposed anesthesia with the patient or authorized representative who has indicated his/her understanding and acceptance.   Dental advisory given  Plan Discussed with: CRNA, Anesthesiologist and Surgeon  Anesthesia Plan Comments:        Anesthesia Quick Evaluation

## 2017-01-21 NOTE — Anesthesia Procedure Notes (Signed)
Anesthesia Regional Block:  Adductor canal block  Pre-Anesthetic Checklist: ,, timeout performed, Correct Patient, Correct Site, Correct Laterality, Correct Procedure, Correct Position, site marked, Risks and benefits discussed,  Surgical consent,  Pre-op evaluation,  At surgeon's request and post-op pain management  Laterality: Left  Prep: chloraprep       Needles:  Injection technique: Single-shot  Needle Type: Stimulator Needle - 80     Needle Length: 10cm 10 cm Needle Gauge: 21 and 21 G    Additional Needles:  Procedures: ultrasound guided (picture in chart) Adductor canal block Narrative:  Start time: 01/21/2017 12:36 PM End time: 01/21/2017 12:45 PM Injection made incrementally with aspirations every 5 mL.  Performed by: Personally

## 2017-01-21 NOTE — Anesthesia Procedure Notes (Signed)
Procedure Name: MAC Date/Time: 01/21/2017 1:15 PM Performed by: Melina Copa, Lacrecia Delval R Pre-anesthesia Checklist: Patient identified, Emergency Drugs available, Suction available, Patient being monitored and Timeout performed Oxygen Delivery Method: Nasal cannula Ventilation: Nasal airway inserted- appropriate to patient size Placement Confirmation: positive ETCO2 Dental Injury: Teeth and Oropharynx as per pre-operative assessment

## 2017-01-21 NOTE — Transfer of Care (Signed)
Immediate Anesthesia Transfer of Care Note  Patient: [name] [name]  Procedure(s) Performed: Procedure(s): TOTAL KNEE ARTHROPLASTY (Left)  Patient Location: PACU  Anesthesia Type:General  Level of Consciousness: awake, oriented and patient cooperative  Airway & Oxygen Therapy: Patient Spontanous Breathing and Patient connected to nasal cannula oxygen  Post-op Assessment: Report given to RN, Post -op Vital signs reviewed and stable and Patient moving all extremities  Post vital signs: Reviewed and stable  Last Vitals:  Vitals:   01/21/17 1201  BP: 121/86  Pulse: 60  Resp: 20  Temp: 37.2 C    Last Pain:  Vitals:   01/21/17 1201  TempSrc: Oral  PainSc:          Complications: No apparent anesthesia complications

## 2017-01-21 NOTE — Progress Notes (Signed)
Orthopedic Tech Progress Note Patient Details:  [name] [name] 12-Jun-1966 [id number]  CPM Left Knee CPM Left Knee: On Left Knee Flexion (Degrees): 90 Left Knee Extension (Degrees): 0 Additional Comments: applied cpm at 0-90. On Left leg/knee.  Pt tolerated well. Nurse at beside.     Kristopher Oppenheim 01/21/2017, 6:11 PM

## 2017-01-21 NOTE — Op Note (Signed)
PATIENT ID:      [name] [name]  MRN:     [id number] DOB/AGE:    [birth year] / 51 y.o.       OPERATIVE REPORT    DATE OF PROCEDURE:  01/21/2017       PREOPERATIVE DIAGNOSIS:   LEFT KNEE OSTEOARTHRITIS                                                       Estimated body mass index is 29.62 kg/m as calculated from the following:   Height as of this encounter: 5\' 5"  (1.651 m).   Weight as of this encounter: 178 lb (80.7 kg).     POSTOPERATIVE DIAGNOSIS:   Same                                                                      Estimated body mass index is 29.62 kg/m as calculated from the following:   Height as of this encounter: 5\' 5"  (1.651 m).   Weight as of this encounter: 178 lb (80.7 kg).     PROCEDURE:  Procedure(s):LEFT TOTAL KNEE ARTHROPLASTY      SURGEON:  [name], MD    ASSISTANT:   Biagio Borg, PA-C   (Present and scrubbed throughout the case, critical for assistance with exposure, retraction, instrumentation, and closure.)          ANESTHESIA: regional, spinal and IV sedation     DRAINS: HEMOVAC, CLAMPED LEFT KNEE     TOURNIQUET TIME:  Total Tourniquet Time Documented: Thigh (Left) - 90 minutes Total: Thigh (Left) - 90 minutes     COMPLICATIONS: SMALL MEDIAL TIBIAL PLATEAU FRACTURE ABOUT 1 CM FIXED WITH SCREW AND SUTURE-STABLE  CONDITION:  stable  PROCEDURE IN DETAIL: [place] 01/21/2017, 3:30 PM

## 2017-01-21 NOTE — Anesthesia Postprocedure Evaluation (Addendum)
Anesthesia Post Note  Patient: [name] [name]  Procedure(s) Performed: Procedure(s) (LRB): TOTAL KNEE ARTHROPLASTY (Left)  Patient location during evaluation: PACU Anesthesia Type: MAC Level of consciousness: awake and alert Pain management: pain level controlled Vital Signs Assessment: post-procedure vital signs reviewed and stable Respiratory status: spontaneous breathing and respiratory function stable Cardiovascular status: blood pressure returned to baseline and stable Postop Assessment: spinal receding Anesthetic complications: no       Last Vitals:  Vitals:   01/21/17 1600 01/21/17 1610  BP: (!) 79/50 (!) 85/54  Pulse:  69  Resp:  16  Temp:      Last Pain:  Vitals:   01/21/17 1201  TempSrc: Oral  PainSc:                  [name]

## 2017-01-21 NOTE — H&P (Signed)
The recent History & Physical has been reviewed. I have personally examined the patient today. There is no interval change to the documented History & Physical. The patient would like to proceed with the procedure.  Garald Balding 01/21/2017,  12:30 PM

## 2017-01-22 ENCOUNTER — Telehealth (INDEPENDENT_AMBULATORY_CARE_PROVIDER_SITE_OTHER): Payer: Self-pay | Admitting: Orthopaedic Surgery

## 2017-01-22 LAB — BASIC METABOLIC PANEL
Anion gap: 7 (ref 5–15)
BUN: 9 mg/dL (ref 6–20)
CALCIUM: 7.8 mg/dL — AB (ref 8.9–10.3)
CO2: 27 mmol/L (ref 22–32)
CREATININE: 0.77 mg/dL (ref 0.44–1.00)
Chloride: 94 mmol/L — ABNORMAL LOW (ref 101–111)
GFR calc non Af Amer: >60 mL/min (ref >60)
Glucose, Bld: 104 mg/dL — ABNORMAL HIGH (ref 65–99)
Potassium: 3.4 mmol/L — ABNORMAL LOW (ref 3.5–5.1)
SODIUM: 128 mmol/L — AB (ref 135–145)

## 2017-01-22 LAB — CBC
HCT: 26.1 % — ABNORMAL LOW (ref 36.0–46.0)
Hemoglobin: 8.7 g/dL — ABNORMAL LOW (ref 12.0–15.0)
MCH: 32.7 pg (ref 26.0–34.0)
MCHC: 33.3 g/dL (ref 30.0–36.0)
MCV: 98.1 fL (ref 78.0–100.0)
PLATELETS: 180 10*3/uL (ref 150–400)
RBC: 2.66 MIL/uL — ABNORMAL LOW (ref 3.87–5.11)
RDW: 12 % (ref 11.5–15.5)
WBC: 6.9 10*3/uL (ref 4.0–10.5)

## 2017-01-22 LAB — URINE CULTURE: CULTURE: NO GROWTH

## 2017-01-22 NOTE — Telephone Encounter (Signed)
Patient is requesting that Dr. Durward Fortes please call her. She is in a lot of pain and would like to speak to him.

## 2017-01-22 NOTE — Progress Notes (Signed)
Physical Therapy Treatment Patient Details Name: [name] [name] MRN: [id number] DOB: Mar 09, 1966 Today's Date: 01/22/2017    History of Present Illness Admitted for L TKA, 50%PWB;  has a past medical history of Anxiety; Arthritis; Bell's palsy (2016); Cancer ([place]); Depression; Hypercholesteremia; and PONV (postoperative nausea and vomiting).  has a past surgical history that includes Knee surgery; Foot surgery (Bilateral); Plantar fascia release (Right, 09/27/2014);     PT Comments    Session focused on progressive amb with very nice progression of distance and ability to keep 50%PWB L knee; Overall on track for dc home -- would like to verify 24 hour assist at home  Follow Up Recommendations  Home health PT;Supervision/Assistance - 24 hour     Equipment Recommendations  3in1 (PT)    Recommendations for Other Services       Precautions / Restrictions Precautions Precautions: Knee Precaution Booklet Issued: Yes (comment) Precaution Comments: reviewed no pillow under knee Restrictions Weight Bearing Restrictions: Yes LLE Weight Bearing: Partial weight bearing LLE Partial Weight Bearing Percentage or Pounds: 50    Mobility  Bed Mobility Overal bed mobility: Needs Assistance Bed Mobility: Supine to Sit;Sit to Supine     Supine to sit: Min assist Sit to supine: Min assist   General bed mobility comments: Min assist for LEs  Transfers Overall transfer level: Needs assistance Equipment used: Rolling walker (2 wheeled) Transfers: Sit to/from Stand Sit to Stand: Min assist         General transfer comment: Cues for hand placement and safety  Ambulation/Gait Ambulation/Gait assistance: Min guard Ambulation Distance (Feet): 60 Feet Assistive device: Rolling walker (2 wheeled) Gait Pattern/deviations: Step-to pattern (emerging step-through)     General Gait Details: Cues for gait sequence and to use RW for 50%PWB   Stairs            Wheelchair Mobility     Modified Rankin (Stroke Patients Only)       Balance Overall balance assessment: Needs assistance   Sitting balance-Leahy Scale: Good     Standing balance support: Single extremity supported;Bilateral upper extremity supported;During functional activity Standing balance-Leahy Scale: Fair                      Cognition Arousal/Alertness: Awake/alert Behavior During Therapy: WFL for tasks assessed/performed Overall Cognitive Status: Impaired/Different from baseline Area of Impairment: Attention;Safety/judgement;Problem solving   Current Attention Level: Sustained     Safety/Judgement: Decreased awareness of safety   Problem Solving: Requires verbal cues;Requires tactile cues;Difficulty sequencing General Comments: Less impulsive than this am's eval; still with very fast-moving thoughts and tangential conversation    Exercises      General Comments        Pertinent Vitals/Pain Pain Assessment: 0-10 Pain Score: 8  Pain Location: L knee Pain Descriptors / Indicators: Aching;Guarding;Grimacing;Sore Pain Intervention(s): Limited activity within patient's tolerance    Home Living Family/patient expects to be discharged to:: Private residence Living Arrangements: Children Available Help at Discharge: Family;Available PRN/intermittently Type of Home: House Home Access: Stairs to enter Entrance Stairs-Rails: None Home Layout: Two level;1/2 bath on main level;Bed/bath upstairs Home Equipment: Walker - 2 wheels      Prior Function Level of Independence: Independent          PT Goals (current goals can now be found in the care plan section) Acute Rehab PT Goals Patient Stated Goal: get back to what she loves (working out, tennis) PT Goal Formulation: With patient Time For Goal Achievement: 01/29/17 Potential to  Achieve Goals: Good Progress towards PT goals: Progressing toward goals    Frequency    7X/week      PT Plan Current plan remains  appropriate    Co-evaluation             End of Session Equipment Utilized During Treatment: Gait belt         Time: [id number] PT Time Calculation (min) (ACUTE ONLY): 33 min  Charges:  $Gait Training: 23-37 mins                    G Codes:      Colletta [place] 2017-02-12, 4:51 PM  Roney Marion, [place] Pager [phone] Office [phone]

## 2017-01-22 NOTE — Progress Notes (Signed)
PATIENT ID: [name] [name]        MRN:  [id number]          DOB/AGE: [birth year] / 51 y.o.    [name], MD   Biagio Borg, PA-C [address] [place], [place]  [address]                             [phone]   PROGRESS NOTE  Subjective:  negative for Chest Pain  negative for Shortness of Breath  negative for Nausea/Vomiting   negative for Calf Pain    Tolerating Diet: yes         Patient reports pain as moderate.     Presently controlled with meds  Objective: Vital signs in last 24 hours:   Patient Vitals for the past 24 hrs:  BP Temp Temp src Pulse Resp SpO2 Height Weight  01/22/17 0426 (!) 96/54 98.8 F (37.1 C) Oral (!) 58 16 99 % - -  01/22/17 0045 (!) 89/50 98.5 F (36.9 C) Oral 61 16 97 % - -  01/21/17 2018 (!) 95/49 98.6 F (37 C) Oral 64 16 98 % - -  01/21/17 1925 (!) 102/57 - - 60 14 97 % - -  01/21/17 1915 - - - 60 12 96 % - -  01/21/17 1900 - - - 68 17 98 % - -  01/21/17 1845 120/70 - - 68 15 98 % - -  01/21/17 1830 - - - 63 13 96 % - -  01/21/17 1815 128/81 - - 67 (!) 29 98 % - -  01/21/17 1800 - - - 62 13 99 % - -  01/21/17 1745 (!) 109/57 - - 64 14 97 % - -  01/21/17 1730 119/65 - - 67 14 97 % - -  01/21/17 1715 121/89 - - 69 20 100 % - -  01/21/17 1700 124/67 - - 66 15 96 % - -  01/21/17 1645 - - - (!) 57 12 99 % - -  01/21/17 1630 (!) 89/61 - - 65 14 100 % - -  01/21/17 1615 97/67 - - 73 18 99 % - -  01/21/17 1610 (!) 85/54 - - 69 16 98 % - -  01/21/17 1600 (!) 79/50 - - 65 - - - -  01/21/17 1554 (!) 193/142 97.9 F (36.6 C) - 70 (!) 22 100 % - -  01/21/17 1201 121/86 98.9 F (37.2 C) Oral 60 20 97 % 5\' 5"  (1.651 m) 178 lb (80.7 kg)      Intake/Output from previous day:   01/30 0701 - 01/31 0700 In: 2475 [I.V.:2225] Out: 1960 [Urine:1500; Drains:210]   Intake/Output this shift:   No intake/output data recorded.   Intake/Output      01/30 0701 - 01/31 0700 01/31 0701 - 02/01 0700   I.V. (mL/kg) 2225 (27.6)    Other 50    IV  Piggyback 200    Total Intake(mL/kg) 2475 (30.7)    Urine (mL/kg/hr) 1500    Drains 210    Blood 250    Total Output 1960     Net +515             LABORATORY DATA:  Recent Labs  01/21/17 1154 01/22/17 0534  WBC 6.2 6.9  HGB 13.9 8.7*  HCT 39.9 26.1*  PLT 256 180    Recent Labs  01/21/17 1154 01/22/17 0534  NA 140 128*  K 3.7 3.4*  CL 109 94*  CO2 24 27  BUN 11 9  CREATININE 0.87 0.77  GLUCOSE 93 104*  CALCIUM 9.7 7.8*   Lab Results  Component Value Date   INR 0.93 01/21/2017    Recent Radiographic Studies :  Xr Knee 1-2 Views Left  Result Date: 01/02/2017 AP lateral left knee reviewed.  Merchant also reviewed.  Patellofemoral arthritis is present.  There some flattening of the lateral femoral condyle noted on the AP.  Medial lateral joint space is otherwise maintained.  Bone density looks reasonable.    Examination:  General appearance: alert, cooperative and no distress  Wound Exam: clean, dry, intact   Drainage:  Moderate amount Serosanguinous exudate in hemovac  Motor Exam: EHL, FHL, Anterior Tibial and Posterior Tibial Intact  Sensory Exam: Superficial Peroneal, Deep Peroneal and Tibial normal  Vascular Exam: Normal  Assessment:    1 Day Post-Op  Procedure(s) (LRB): TOTAL KNEE ARTHROPLASTY (Left)  ADDITIONAL DIAGNOSIS:  Principal Problem:   Unilateral primary osteoarthritis, left knee Active Problems:   S/P total knee replacement using cement, left  Acute Blood Loss Anemia-asymptomatic   Plan: Physical Therapy as ordered Partial Weight Bearing @ 50% (PWB)  DVT Prophylaxis:  Xarelto, Foot Pumps and TED hose  DISCHARGE PLAN: Home  DISCHARGE NEEDS: HHPT, CPM, Walker and 3-in-1 comode seat  Saline lock IV, OOB with PT, monitor labs, foley out, hold hemovac until am      Garald Balding  01/22/2017 8:20 AM  Patient ID: [name] [name], female   DOB: [birth year], 51 y.o.   MRN: [id number]

## 2017-01-22 NOTE — Evaluation (Signed)
Occupational Therapy Evaluation Patient Details Name: [name] [name] MRN: [id number] DOB: 1966-05-08 Today's Date: 01/22/2017    History of Present Illness Admitted for L TKA, 50%PWB;  has a past medical history of Anxiety; Arthritis; Bell's palsy (2016); Cancer ([place]); Depression; Hypercholesteremia; and PONV (postoperative nausea and vomiting).  has a past surgical history that includes Knee surgery; Foot surgery (Bilateral); Plantar fascia release (Right, 09/27/2014);    Clinical Impression   Pt with decline in function and safety with ADLs and ADL mobility with decreased balance, endurance and safety awareness. Pt impulsive, extremely distractible, and will need reinforcement. Pt would benefit from acute OT services to address impairments to increase level of function and safety    Follow Up Recommendations  Home health OT;Supervision - Intermittent    Equipment Recommendations  3 in 1 bedside commode;Tub/shower seat Management consultant)    Recommendations for Other Services       Precautions / Restrictions Precautions Precautions: Knee Precaution Booklet Issued: Yes (comment) Precaution Comments: reviewed no pillow under knee Restrictions Weight Bearing Restrictions: Yes LLE Weight Bearing: Partial weight bearing LLE Partial Weight Bearing Percentage or Pounds: 50      Mobility Bed Mobility Overal bed mobility: Needs Assistance Bed Mobility: Supine to Sit     Supine to sit: Min assist     General bed mobility comments: up with PT upon arrival  Transfers Overall transfer level: Needs assistance Equipment used: Rolling walker (2 wheeled) Transfers: Sit to/from Stand Sit to Stand: Min assist;Mod assist         General transfer comment: Min assist to steady, especially with transition of hands from seated surface to RW; loss of balance with sit to stand from commode, requiring mod assist    Balance Overall balance assessment: Needs assistance   Sitting balance-Leahy  Scale: Good     Standing balance support: Single extremity supported;Bilateral upper extremity supported;During functional activity Standing balance-Leahy Scale: Fair                              ADL Overall ADL's : Needs assistance/impaired     Grooming: Wash/dry hands;Wash/dry face;Standing;Min guard   Upper Body Bathing: Set up;Sitting   Lower Body Bathing: Moderate assistance   Upper Body Dressing : Set up;Sitting   Lower Body Dressing: Moderate assistance   Toilet Transfer: Minimal assistance;Moderate assistance;Ambulation;RW;Cueing for safety;Comfort height toilet   Toileting- Clothing Manipulation and Hygiene: Minimal assistance;Sit to/from stand   Tub/ Shower Transfer: Minimal assistance;Moderate assistance;3 in 1;Rolling walker;Ambulation   Functional mobility during ADLs: Minimal assistance;Moderate assistance;Cueing for safety       Vision Vision Assessment?: No apparent visual deficits              Pertinent Vitals/Pain Pain Assessment: 0-10 Pain Score: 8  Pain Location: L knee Pain Descriptors / Indicators: Aching;Guarding;Grimacing;Sore Pain Intervention(s): Limited activity within patient's tolerance;Monitored during session;Ice applied     Hand Dominance Left   Extremity/Trunk Assessment Upper Extremity Assessment Upper Extremity Assessment: Overall WFL for tasks assessed   Lower Extremity Assessment Lower Extremity Assessment: Defer to PT evaluation LLE Deficits / Details: Grossly decr AROM and strength, limited by pain and muscle guarding; approx 3-45 deg  LLE: Unable to fully assess due to pain   Cervical / Trunk Assessment Cervical / Trunk Assessment: Normal   Communication Communication Communication: No difficulties   Cognition Arousal/Alertness: Awake/alert Behavior During Therapy: Impulsive Overall Cognitive Status: Impaired/Different from baseline Area of Impairment: Attention;Safety/judgement;Problem solving  Current Attention Level: Sustained     Safety/Judgement: Decreased awareness of safety   Problem Solving: Requires verbal cues;Requires tactile cues;Difficulty sequencing General Comments: Extremely tangential, requiring mod cues to attend to tasks   General Comments   Pt pleasant, cooperative                 Home Living Family/patient expects to be discharged to:: Private residence Living Arrangements: Children Available Help at Discharge: Family;Available PRN/intermittently Type of Home: House Home Access: Stairs to enter [employer] of Steps: 3 Entrance Stairs-Rails: None Home Layout: Two level;1/2 bath on main level;Bed/bath upstairs Alternate Level Stairs-Number of Steps: flight Alternate Level Stairs-Rails: Right Bathroom Shower/Tub: Tub/shower unit;Walk-in shower   Bathroom Toilet: Standard     Home Equipment: Walker - 2 wheels          Prior Functioning/Environment Level of Independence: Independent                 OT Problem List: Decreased activity tolerance;Decreased cognition;Impaired balance (sitting and/or standing);Pain;Decreased safety awareness;Decreased knowledge of use of DME or AE   OT Treatment/Interventions: Self-care/ADL training;DME and/or AE instruction;Therapeutic activities;Patient/family education    OT Goals(Current goals can be found in the care plan section) Acute Rehab OT Goals Patient Stated Goal: get back to what she loves (working out, tennis) OT Goal Formulation: With patient Time For Goal Achievement: 01/29/17 Potential to Achieve Goals: Good ADL Goals Pt Will Perform Grooming: with supervision;with set-up;standing Pt Will Perform Lower Body Bathing: with min assist;with min guard assist Pt Will Perform Lower Body Dressing: with min assist;with min guard assist Pt Will Transfer to Toilet: with min assist;with min guard assist;ambulating Pt Will Perform Toileting - Clothing Manipulation and hygiene: with min  guard assist;sit to/from stand  OT Frequency: Min 2X/week   Barriers to D/C:    no barriers                     End of Session Equipment Utilized During Treatment: Gait belt;Rolling walker;Other (comment) (3 in 1) CPM Left Knee CPM Left Knee: Off  Activity Tolerance: Patient tolerated treatment well Patient left: in chair;with call bell/phone within reach;with chair alarm set   Time: [id number] OT Time Calculation (min): 23 min Charges:  OT General Charges $OT Visit: 1 Procedure OT Evaluation $OT Eval Moderate Complexity: 1 Procedure OT Treatments $Therapeutic Activity: 8-22 mins G-Codes:    Britt Bottom 01/22/2017, 2:10 PM

## 2017-01-22 NOTE — Evaluation (Signed)
Physical Therapy Evaluation Patient Details Name: [name] [name] MRN: [id number] DOB: Jul 09, 1966 Today's Date: 01/22/2017   History of Present Illness  Admitted for L TKA, 50%PWB;  has a past medical history of Anxiety; Arthritis; Bell's palsy (2016); Cancer ([place]); Depression; Hypercholesteremia; and PONV (postoperative nausea and vomiting).  has a past surgical history that includes Knee surgery; Foot surgery (Bilateral); Plantar fascia release (Right, 09/27/2014);   Clinical Impression   Pt is s/p TKA resulting in the deficits listed below (see PT Problem List). Requires min/mod assist for safe mobility; On eval, [name] [name] was extremely distractible, and will need reinforcement of education provided today; would like to verify that [name] [name] has plenty of assist at home;  Pt will benefit from skilled PT to increase their independence and safety with mobility to allow discharge to the venue listed below.      Follow Up Recommendations Home health PT;Supervision/Assistance - 24 hour    Equipment Recommendations  3in1 (PT)    Recommendations for Other Services OT consult     Precautions / Restrictions Precautions Precautions: Knee Precaution Booklet Issued: Yes (comment) Precaution Comments: Pt educated to not allow any pillow or bolster under knee for healing with optimal range of motion. Will need reinforcement Restrictions LLE Weight Bearing: Partial weight bearing LLE Partial Weight Bearing Percentage or Pounds: 50      Mobility  Bed Mobility Overal bed mobility: Needs Assistance Bed Mobility: Supine to Sit     Supine to sit: Min assist     General bed mobility comments: Cues for technqiue; min assist to scoot hips to EOB, and then handheld assist to pull to sit  Transfers Overall transfer level: Needs assistance Equipment used: Rolling walker (2 wheeled) Transfers: Sit to/from Stand Sit to Stand: Min assist;Mod assist         General transfer comment: Min assist to steady,  especially with transition of hands from seated surface to RW; loss of balance with sit to stand from commode, requiring mod assist  Ambulation/Gait Ambulation/Gait assistance: Min assist Ambulation Distance (Feet): 12 Feet Assistive device: Rolling walker (2 wheeled) Gait Pattern/deviations: Step-to pattern     General Gait Details: Cues for gait sequence and to use RW for 50%PWB  Stairs            Wheelchair Mobility    Modified Rankin (Stroke Patients Only)       Balance                                             Pertinent Vitals/Pain Pain Assessment: 0-10 Pain Score: 9  Pain Location: L knee Pain Descriptors / Indicators: Aching;Grimacing;Guarding Pain Intervention(s): Limited activity within patient's tolerance;Monitored during session    Home Living Family/patient expects to be discharged to:: Private residence Living Arrangements: Children Available Help at Discharge: Family;Available PRN/intermittently (Need more info re: how much relaible assist available) Type of Home: House Home Access: Stairs to enter Entrance Stairs-Rails: None (needs verification) Entrance Stairs-Number of Steps: 3 Home Layout: Two level;1/2 bath on main level;Bed/bath upstairs Home Equipment: Walker - 2 wheels      Prior Function Level of Independence: Independent               Hand Dominance   Dominant Hand: Left    Extremity/Trunk Assessment   Upper Extremity Assessment Upper Extremity Assessment: Defer to OT evaluation    Lower Extremity Assessment  Lower Extremity Assessment: LLE deficits/detail LLE Deficits / Details: Grossly decr AROM and strength, limited by pain and muscle guarding; approx 3-45 deg  LLE: Unable to fully assess due to pain       Communication   Communication: No difficulties  Cognition Arousal/Alertness: Awake/alert Behavior During Therapy: Impulsive Overall Cognitive Status: Impaired/Different from baseline Area  of Impairment: Attention;Safety/judgement;Problem solving   Current Attention Level: Sustained (Extremely distractible)     Safety/Judgement: Decreased awareness of safety   Problem Solving: Requires verbal cues;Requires tactile cues;Difficulty sequencing General Comments: Extremely tangential, requiring mod cues to attend to tasks    General Comments      Exercises     Assessment/Plan    PT Assessment Patient needs continued PT services  PT Problem List Decreased strength;Decreased range of motion;Decreased activity tolerance;Decreased balance;Decreased mobility;Decreased knowledge of use of DME;Decreased safety awareness;Decreased knowledge of precautions;Pain          PT Treatment Interventions DME instruction;Gait training;Stair training;Functional mobility training;Therapeutic activities;Therapeutic exercise;Balance training;Patient/family education    PT Goals (Current goals can be found in the Care Plan section)  Acute Rehab PT Goals Patient Stated Goal: get back to what she loves (working out, tennis) PT Goal Formulation: With patient Time For Goal Achievement: 01/29/17 Potential to Achieve Goals: Good    Frequency 7X/week   Barriers to discharge Decreased caregiver support Need more info re: available home assist    Co-evaluation               End of Session Equipment Utilized During Treatment: Gait belt Activity Tolerance: Patient tolerated treatment well Patient left: Other (comment) (with OT ) Nurse Communication: Mobility status         Time: 1000-1030 PT Time Calculation (min) (ACUTE ONLY): 30 min   Charges:   PT Evaluation $PT Eval Moderate Complexity: 1 Procedure PT Treatments $Gait Training: 8-22 mins   PT G Codes:        Colletta [place] 01/22/2017, 1:28 PM  Roney Marion, PT  Acute Rehabilitation Services Pager [phone] Office [phone]

## 2017-01-23 ENCOUNTER — Encounter (HOSPITAL_COMMUNITY): Payer: Self-pay | Admitting: General Practice

## 2017-01-23 LAB — CBC
HCT: 27 % — ABNORMAL LOW (ref 36.0–46.0)
HEMOGLOBIN: 9.2 g/dL — AB (ref 12.0–15.0)
MCH: 33.2 pg (ref 26.0–34.0)
MCHC: 34.1 g/dL (ref 30.0–36.0)
MCV: 97.5 fL (ref 78.0–100.0)
Platelets: 205 10*3/uL (ref 150–400)
RBC: 2.77 MIL/uL — AB (ref 3.87–5.11)
RDW: 11.5 % (ref 11.5–15.5)
WBC: 8.3 10*3/uL (ref 4.0–10.5)

## 2017-01-23 LAB — BASIC METABOLIC PANEL
ANION GAP: 3 — AB (ref 5–15)
BUN: 8 mg/dL (ref 6–20)
CHLORIDE: 106 mmol/L (ref 101–111)
CO2: 29 mmol/L (ref 22–32)
Calcium: 8.2 mg/dL — ABNORMAL LOW (ref 8.9–10.3)
Creatinine, Ser: 0.82 mg/dL (ref 0.44–1.00)
Glucose, Bld: 133 mg/dL — ABNORMAL HIGH (ref 65–99)
POTASSIUM: 4 mmol/L (ref 3.5–5.1)
SODIUM: 138 mmol/L (ref 135–145)

## 2017-01-23 MED ORDER — OXYCODONE HCL 5 MG PO TABS: 5.0000 mg | ORAL_TABLET | ORAL | 0 refills | Status: DC | PRN

## 2017-01-23 MED ORDER — METHOCARBAMOL 500 MG PO TABS: 500.0000 mg | ORAL_TABLET | Freq: Three times a day (TID) | ORAL | 0 refills | Status: AC | PRN

## 2017-01-23 MED ORDER — RIVAROXABAN 10 MG PO TABS: 10.0000 mg | ORAL_TABLET | Freq: Every day | ORAL | 0 refills | Status: AC

## 2017-01-23 MED ORDER — ACETAMINOPHEN 325 MG PO TABS
650.0000 mg | ORAL_TABLET | Freq: Four times a day (QID) | ORAL | Status: DC | PRN
  Administered 2017-01-23 – 2017-01-24 (×3): 650 mg via ORAL
  Filled 2017-01-23 (×3): qty 2

## 2017-01-23 NOTE — Progress Notes (Signed)
Physical Therapy Treatment Patient Details Name: [name] [name] MRN: [id number] DOB: 1966/05/30 Today's Date: 01/23/2017    History of Present Illness Admitted for L TKA, 50%PWB;  has a past medical history of Anxiety; Arthritis; Bell's palsy (2016); Cancer ([place]); Depression; Hypercholesteremia; and PONV (postoperative nausea and vomiting).  has a past surgical history that includes Knee surgery; Foot surgery (Bilateral); Plantar fascia release (Right, 09/27/2014);     PT Comments    Pt continues to require increased cues for safe mobility. Pt becomes tearful due to pain and reports pain as being poorly controlled. Performed indoor gait with very slow cadence and short step length LLE. Pt is assisted to bathroom and able to perform toileting with SBA. Pt need to perform stair negotiation prior to discharge home.    Follow Up Recommendations  Home health PT;Supervision/Assistance - 24 hour     Equipment Recommendations  3in1 (PT)    Recommendations for Other Services       Precautions / Restrictions Precautions Precautions: Knee Precaution Booklet Issued: Yes (comment) Precaution Comments: reviewed no pillow under knee Restrictions Weight Bearing Restrictions: Yes LLE Weight Bearing: Partial weight bearing LLE Partial Weight Bearing Percentage or Pounds: 50    Mobility  Bed Mobility Overal bed mobility: Needs Assistance Bed Mobility: Supine to Sit     Supine to sit: Min assist     General bed mobility comments: Min assist for LEs  Transfers Overall transfer level: Needs assistance Equipment used: Rolling walker (2 wheeled) Transfers: Sit to/from Stand Sit to Stand: Min assist         General transfer comment: Cues for hand placement and safety  Ambulation/Gait Ambulation/Gait assistance: Min guard Ambulation Distance (Feet): 100 Feet Assistive device: Rolling walker (2 wheeled) Gait Pattern/deviations: Step-to pattern Gait velocity: decreased Gait velocity  interpretation: Below normal speed for age/gender General Gait Details: Cues for gait sequence and to use RW for 50%PWB   Stairs            Wheelchair Mobility    Modified Rankin (Stroke Patients Only)       Balance Overall balance assessment: Needs assistance Sitting-balance support: No upper extremity supported;Feet supported Sitting balance-Leahy Scale: Good     Standing balance support: Single extremity supported;Bilateral upper extremity supported;During functional activity Standing balance-Leahy Scale: Fair                      Cognition Arousal/Alertness: Awake/alert Behavior During Therapy: WFL for tasks assessed/performed Overall Cognitive Status: Impaired/Different from baseline Area of Impairment: Attention;Safety/judgement;Problem solving   Current Attention Level: Alternating     Safety/Judgement: Decreased awareness of safety   Problem Solving: Requires verbal cues;Requires tactile cues;Difficulty sequencing General Comments: continues to require cues to attend to tasks. Moves quickly and continus to have decreased safety awareness.     Exercises      General Comments        Pertinent Vitals/Pain Pain Assessment: 0-10 Pain Score: 8  Pain Location: L knee Pain Descriptors / Indicators: Aching;Guarding;Grimacing;Sore Pain Intervention(s): Monitored during session;Limited activity within patient's tolerance;Premedicated before session;Ice applied    Home Living                      Prior Function            PT Goals (current goals can now be found in the care plan section) Acute Rehab PT Goals Patient Stated Goal: get back to what she loves (working out, tennis) Progress towards PT goals:  Progressing toward goals    Frequency    7X/week      PT Plan Current plan remains appropriate    Co-evaluation             End of Session Equipment Utilized During Treatment: Gait belt Activity Tolerance: Patient  tolerated treatment well Patient left: in bed;with call bell/phone within reach     Time: [id number] PT Time Calculation (min) (ACUTE ONLY): 30 min  Charges:  $Gait Training: 23-37 mins $Therapeutic Activity: 8-22 mins                    G Codes:      Scheryl Marten PT, DPT  [phone]  01/23/2017, 5:12 PM

## 2017-01-23 NOTE — Care Management Note (Signed)
Case Management Note  Patient Details  Name: Breshawn Citrano MRN: [id number] Date of Birth: 1966/11/18  Subjective/Objective:    51 yr old female s/p left total knee arthroplasty.                Action/Plan: Case manager spoke with patient concerning [employer] and DME . Patient was preoperatively setup with [facility], no changes. Patient has her rolling walker, states she wont get 3in1 because insurance doesn't cover it. She will have family support at discharge.    Expected Discharge Date:  01/24/17               Expected Discharge Plan:  [place]  In-House Referral:  NA  Discharge planning Services  CM Consult  Post Acute Care Choice:  Home Health Choice offered to:  Patient  DME Arranged:  N/A DME Agency:     HH Arranged:  PT [place] Agency:  [facility] (formerly [employer])  Status of Service:  Completed, signed off  If discussed at [employer], dates discussed:    Additional Comments:  Ninfa Meeker, RN 01/23/2017, 4:26 PM

## 2017-01-23 NOTE — Op Note (Signed)
NAMESIENA, [name] [name]                ACCOUNT NO.:  [id number]  MEDICAL RECORD NO.:  [id number]  LOCATION:                                 FACILITY:  PHYSICIAN:  Vonna Kotyk. Makell Drohan, M.D.DATE OF BIRTH:  01/23/1966  DATE OF PROCEDURE:  01/21/2017 DATE OF DISCHARGE:                              OPERATIVE REPORT   PREOPERATIVE DIAGNOSIS:  Osteoarthritis, left knee.  POSTOPERATIVE DIAGNOSIS:  Osteoarthritis, left knee.  PROCEDURE:  Left total knee replacement.  SURGEON:  Vonna Kotyk. Durward Fortes, M.D.  ASSISTANT:  Aaron Edelman D. Petrarca, P.A.-C.  ANESTHESIA:  Spinal with IV sedation and adductor canal block.  COMPLICATIONS:  Small medial tibial plateau fracture about 1 cm or less in width that was fixed with a single 3.5 mm compression screw and suture.  COMPONENTS:  DePuy LCS medium femoral component, #2 rotating tibial plateau with a 12.5 mm polyethylene bridging bearing, a metal backed 3- peg rotating patella.  Components were secured with polymethyl methacrylate.  DESCRIPTION OF PROCEDURE:  Mr. Devlin was met in the holding area, identified the left knee as appropriate operative site and marked it accordingly.  Anesthesia performed an adductor canal block.  Any questions were answered.  The patient was then carefully transported to the operating room #7. Spinal anesthesia was performed without difficulty per Anesthesia.  The patient was then placed supine on the operating table.  Nursing staff inserted a Foley catheter.  Urine was clear.  We did send the specimen for culture and sensitivity.  The left lower extremity was then placed in a thigh tourniquet.  The left leg was then prepped with chlorhexidine scrub and DuraPrep x2 from the tourniquet to the tips of the toes.  Sterile draping was performed. Time-out was called again.  The extremity was then elevated and Esmarch exsanguinated with a proximal tourniquet at 350 mmHg.  A midline longitudinal incision was made centered about  the patella extending from the superior pouch to the tibial tubercle.  I did incorporate an old longitudinal scar that was placed lateral about the patella.  Via sharp dissection, incision was carried down to subcutaneous tissue.  The first layer of capsule was incised in the midline.  A medial parapatellar incision was then made through the deep capsule with the Bovie.  The joint was entered.  There was minimal clear yellow joint effusion.  The patella was everted 180 degrees laterally, knee flexed to 90 degrees.  There were moderately large osteophytes along the medial and lateral femoral condyle.  There was considerable loss of articular cartilage, particularly along the lateral femoral condyle and majority of the patella.  There were large areas of excoriated cartilage along the lateral tibial plateau.  There was some grade 2 and 3 changes of chondromalacia in the medial compartment.  I measured a medium femoral component.  First, bony cut was then made transversely on the proximal tibia with a 7-degree angle of declination.  After each bony cut on the tibia and the femur, I used the external guide to be sure we had appropriate alignment.  The patient did have preoperative recurvatum, but no malalignment with varus or valgus.  Subsequent cuts were then made on the femur  using the medium femoral jig.  Lamina spreaders were then inserted on both sides of the tibia to visualize the different compartments.  I performed a medial and lateral meniscectomy as well as excision of the ACL and PCL.  I measured the flexion and extension gaps to be symmetrical at 12.5 mm.  While inserting the lamina spreader medially, there was a crack in the medial tibial plateau.  It was stable.  It was nondisplaced and I elected to fix this with a single distally threaded cancellous screw and several 0 Vicryl sutures.  I had a very good fixation and appeared to be perfectly stable.  We then proceeded to  make the final cut on the tibia for tapering purposes.  I did use a 4-degree distal femoral valgus cut.  Retractors were placed around the tibia, was advanced anteriorly.  I measured a #2 tibial tray, this was pinned in place.  A center hole was made followed by the keeled cut.  The screw that we had previously inserted was posterior and well out of the way of any the equipment or instruments or components.  With the tibial tray in place, the 10-mm polyethylene bridging bearing was inserted followed by the medium femoral component.  This was reduced.  I thought there was still a little bit opening medially and there was still recurvatum, so I trialed a 12.5 mm component and this was perfectly stable.  No opening with varus or valgus stress, and the knee appeared to be level at that point without increased recurvatum.  The trial components removed.  We copiously irrigated the joint with saline solution.  The final components were then applied with polymethyl methacrylate.  We initially applied the #2 tibial tray followed by the 12.5 mm bridging bearing and then the medium femoral component.  Any extraneous methacrylate was removed from the periphery of the components.  The patella was applied with methacrylate and a patellar clamp.  At approximately 16 minutes, the methacrylate had matured during which time we irrigated the joint and infiltrated the deep capsule with 0.25% Marcaine without epinephrine.  Tourniquet was deflated at 90 minutes.  We controlled bleeding with tranexamic acid topically and the Bovie.  We thought we had nice hemostasis.  There was one area that appeared to be geniculate bleeding medially and this was Bovie coagulated.  A medium Hemovac was placed.  The lateral compartment of the deep capsule was closed with a running #1 Ethibond, superficial capsule with a running 0 Vicryl, subcu with 3-0 Monocryl.  Skin closed with skin clips.  Sterile bulky dressing was  applied followed by the patient's support stocking.  The patient was then returned to the postanesthesia recovery room without further problem.  Technically the procedure was difficult.  I thought the bone was soft.     Vonna Kotyk. Durward Fortes, M.D.   ______________________________ Vonna Kotyk. Durward Fortes, M.D.    PWW/MEDQ  D:  01/21/2017  T:  01/22/2017  Job:  [id number]

## 2017-01-23 NOTE — Telephone Encounter (Signed)
Would you like me to call?

## 2017-01-23 NOTE — Progress Notes (Signed)
PATIENT ID: [name] [name]        MRN:  [id number]          DOB/AGE: 1966-02-03 / 51 y.o.    [name], MD   Biagio Borg, PA-C [address] [place], [place]  [address]                             [phone]   PROGRESS NOTE  Subjective:  negative for Chest Pain  negative for Shortness of Breath  negative for Nausea/Vomiting   negative for Calf Pain    Tolerating Diet: yes         Patient reports pain as moderate.     Pain controlled with present analgesics  Objective: Vital signs in last 24 hours:   Patient Vitals for the past 24 hrs:  BP Temp Temp src Pulse Resp SpO2  01/23/17 0745 100/62 (!) 101.1 F (38.4 C) Oral 85 18 92 %  01/23/17 0638 115/67 - - - - -  01/23/17 0626 - (!) 101.1 F (38.4 C) Oral 100 - -  01/22/17 2025 (!) 113/59 99.3 F (37.4 C) Oral 82 18 93 %  01/22/17 1500 (!) 134/92 99.7 F (37.6 C) - 66 18 97 %      Intake/Output from previous day:   01/31 0701 - 02/01 0700 In: 1080 [P.O.:1080] Out: 150 [Drains:150]   Intake/Output this shift:   02/01 0701 - 02/01 1900 In: 240 [P.O.:240] Out: -    Intake/Output      01/31 0701 - 02/01 0700 02/01 0701 - 02/02 0700   P.O. 1080 240   I.V. (mL/kg)     Other     IV Piggyback     Total Intake(mL/kg) 1080 (13.4) 240 (3)   Urine (mL/kg/hr) 0 (0)    Drains 150 (0.1)    Blood     Total Output 150     Net +930 +240        Urine Occurrence 6 x       LABORATORY DATA:  Recent Labs  01/21/17 1154 01/22/17 0534 01/23/17 0336  WBC 6.2 6.9 8.3  HGB 13.9 8.7* 9.2*  HCT 39.9 26.1* 27.0*  PLT 256 180 205    Recent Labs  01/21/17 1154 01/22/17 0534 01/23/17 0336  NA 140 128* 138  K 3.7 3.4* 4.0  CL 109 94* 106  CO2 24 27 29   BUN 11 9 8   CREATININE 0.87 0.77 0.82  GLUCOSE 93 104* 133*  CALCIUM 9.7 7.8* 8.2*   Lab Results  Component Value Date   INR 0.93 01/21/2017    Recent Radiographic Studies :  Xr Knee 1-2 Views Left  Result Date: 01/02/2017 AP lateral left knee  reviewed.  Merchant also reviewed.  Patellofemoral arthritis is present.  There some flattening of the lateral femoral condyle noted on the AP.  Medial lateral joint space is otherwise maintained.  Bone density looks reasonable.    Examination:  General appearance: alert, cooperative and no distress  Wound Exam: clean, dry, intact   Drainage:  Scant/small amount Serosanguinous exudate  Motor Exam: EHL, FHL, Anterior Tibial and Posterior Tibial Intact  Sensory Exam: Superficial Peroneal, Deep Peroneal and Tibial normal  Vascular Exam: Normal  Assessment:    2 Days Post-Op  Procedure(s) (LRB): TOTAL KNEE ARTHROPLASTY (Left)  ADDITIONAL DIAGNOSIS:  Principal Problem:   Unilateral primary osteoarthritis, left knee Active Problems:   S/P total knee replacement using cement, left  Acute Blood Loss Anemia-stable, slightly improved from yesterday, still asymptomatic   Plan: Physical Therapy as ordered Partial Weight Bearing @ 50% (PWB)  DVT Prophylaxis:  Xarelto, Foot Pumps and TED hose  DISCHARGE PLAN: Home  DISCHARGE NEEDS: HHPT, CPM, Walker and 3-in-1 comode seat Voiding without difficulty,no nausea, wound clean and dry-changed and hemovac removed. Temp elevated to 101-chest clear-encourage incentive respirometry. No one at home today or tonite-consider D/C in am, OOB with PT      Garald Balding  01/23/2017 9:21 AM  Patient ID: [name] [name], female   DOB: 31-Aug-1966, 51 y.o.   MRN: [id number]

## 2017-01-23 NOTE — Progress Notes (Signed)
Informed MD about fever of 101.1, other vital signs and labs. Ordered tylenol 650mg  q6h prn. Pt appears anxious about fever. Will continue to monitor.

## 2017-01-23 NOTE — Progress Notes (Signed)
Physical Therapy Treatment Patient Details Name: [name] [name] MRN: [id number] DOB: 1966/11/10 Today's Date: 01/23/2017    History of Present Illness Admitted for L TKA, 50%PWB;  has a past medical history of Anxiety; Arthritis; Bell's palsy (2016); Cancer ([place]); Depression; Hypercholesteremia; and PONV (postoperative nausea and vomiting).  has a past surgical history that includes Knee surgery; Foot surgery (Bilateral); Plantar fascia release (Right, 09/27/2014);     PT Comments    Pt is POD 2 and is limited this session by poor pain control. Pt is able to increased gait distance this session, but has increased difficulty with tolerating PWB through LLE. Pt becomes very distracted and requires cues to keep eyes open during gait.    Follow Up Recommendations  Home health PT;Supervision/Assistance - 24 hour     Equipment Recommendations  3in1 (PT)    Recommendations for Other Services       Precautions / Restrictions Precautions Precautions: Knee Precaution Booklet Issued: Yes (comment) Precaution Comments: reviewed no pillow under knee Restrictions Weight Bearing Restrictions: Yes LLE Weight Bearing: Partial weight bearing LLE Partial Weight Bearing Percentage or Pounds: 50    Mobility  Bed Mobility Overal bed mobility: Needs Assistance Bed Mobility: Supine to Sit     Supine to sit: Min assist     General bed mobility comments: Min assist for LEs  Transfers Overall transfer level: Needs assistance Equipment used: Rolling walker (2 wheeled) Transfers: Sit to/from Stand Sit to Stand: Min assist         General transfer comment: Cues for hand placement and safety  Ambulation/Gait Ambulation/Gait assistance: Min guard Ambulation Distance (Feet): 75 Feet Assistive device: Rolling walker (2 wheeled) Gait Pattern/deviations: Step-to pattern Gait velocity: decreased Gait velocity interpretation: Below normal speed for age/gender General Gait Details: Cues for gait  sequence and to use RW for 50%PWB   Stairs            Wheelchair Mobility    Modified Rankin (Stroke Patients Only)       Balance Overall balance assessment: Needs assistance Sitting-balance support: No upper extremity supported;Feet supported Sitting balance-Leahy Scale: Good     Standing balance support: Single extremity supported;Bilateral upper extremity supported;During functional activity Standing balance-Leahy Scale: Fair                      Cognition Arousal/Alertness: Lethargic;Suspect due to medications Behavior During Therapy: [employer] for tasks assessed/performed Overall Cognitive Status: Impaired/Different from baseline Area of Impairment: Attention;Safety/judgement;Problem solving   Current Attention Level: Sustained     Safety/Judgement: Decreased awareness of safety   Problem Solving: Requires verbal cues;Requires tactile cues;Difficulty sequencing General Comments: still with very fast-moving thoughts and tangential conversation    Exercises      General Comments        Pertinent Vitals/Pain Pain Assessment: 0-10 Pain Score: 10-Worst pain ever Pain Location: L knee Pain Descriptors / Indicators: Aching;Guarding;Grimacing;Sore Pain Intervention(s): Monitored during session;Limited activity within patient's tolerance;Premedicated before session;Ice applied    Home Living                      Prior Function            PT Goals (current goals can now be found in the care plan section) Acute Rehab PT Goals Patient Stated Goal: get back to what she loves (working out, tennis) Progress towards PT goals: Progressing toward goals    Frequency    7X/week      PT Plan  Current plan remains appropriate    Co-evaluation             End of Session Equipment Utilized During Treatment: Gait belt Activity Tolerance: Patient tolerated treatment well Patient left: in chair;with call bell/phone within reach     Time:  [id number] PT Time Calculation (min) (ACUTE ONLY): 28 min  Charges:  $Gait Training: 8-22 mins $Therapeutic Activity: 8-22 mins                    G Codes:      Scheryl Marten PT, DPT  [phone]  01/23/2017, 1:33 PM

## 2017-01-23 NOTE — Progress Notes (Signed)
Orthopedic Tech Progress Note Patient Details:  [name] Nov 18, 1966 [id number]  Patient ID: [name] [name] Reason, female   DOB: Nov 01, 1966, 51 y.o.   MRN: [id number] Applied cpm 0-40  [name] 01/23/2017, 6:56 AM

## 2017-01-24 ENCOUNTER — Inpatient Hospital Stay (HOSPITAL_COMMUNITY): Payer: BLUE CROSS/BLUE SHIELD

## 2017-01-24 LAB — BASIC METABOLIC PANEL
ANION GAP: 7 (ref 5–15)
CHLORIDE: 102 mmol/L (ref 101–111)
CO2: 27 mmol/L (ref 22–32)
Calcium: 8 mg/dL — ABNORMAL LOW (ref 8.9–10.3)
Creatinine, Ser: 0.72 mg/dL (ref 0.44–1.00)
GFR calc Af Amer: >60 mL/min (ref >60)
GFR calc non Af Amer: >60 mL/min (ref >60)
GLUCOSE: 125 mg/dL — AB (ref 65–99)
POTASSIUM: 3.3 mmol/L — AB (ref 3.5–5.1)
Sodium: 136 mmol/L (ref 135–145)

## 2017-01-24 LAB — URINALYSIS, ROUTINE W REFLEX MICROSCOPIC
Bilirubin Urine: NEGATIVE
Glucose, UA: NEGATIVE mg/dL
Hgb urine dipstick: NEGATIVE
Ketones, ur: NEGATIVE mg/dL
Leukocytes, UA: NEGATIVE
Nitrite: NEGATIVE
Protein, ur: NEGATIVE mg/dL
Specific Gravity, Urine: 1.009 (ref 1.005–1.030)
pH: 7 (ref 5.0–8.0)

## 2017-01-24 LAB — CBC
HEMATOCRIT: 23.4 % — AB (ref 36.0–46.0)
Hemoglobin: 8.1 g/dL — ABNORMAL LOW (ref 12.0–15.0)
MCH: 33.6 pg (ref 26.0–34.0)
MCHC: 34.6 g/dL (ref 30.0–36.0)
MCV: 97.1 fL (ref 78.0–100.0)
Platelets: 189 10*3/uL (ref 150–400)
RBC: 2.41 MIL/uL — AB (ref 3.87–5.11)
RDW: 11.6 % (ref 11.5–15.5)
WBC: 8.9 10*3/uL (ref 4.0–10.5)

## 2017-01-24 NOTE — Progress Notes (Signed)
Orthopedic Tech Progress Note Patient Details:  [name] [name] 08-Apr-1966 [id number]  CPM Left Knee CPM Left Knee: On Left Knee Flexion (Degrees): 40 Left Knee Extension (Degrees): 0 Additional Comments: applied cpm at 0-90. On Left leg/knee.  Pt tolerated well. Nurse at beside.     [name] 01/24/2017, 1:44 PM

## 2017-01-24 NOTE — Progress Notes (Signed)
IV d/c from right wrist area, and documented under IV site right hand as that was the IV site listed. Site was clean dry and intact without swelling or infiltrate.  Tiny, linear bruise approximately 1/8 inch noted at site of where IV hub was against skin. Pt informed of bruise and that site was without swelling or puffiness.

## 2017-01-24 NOTE — Progress Notes (Signed)
Subjective: 3 Days Post-Op Procedure(s) (LRB): TOTAL KNEE ARTHROPLASTY (Left) Patient reports pain as moderate.    Objective: Vital signs in last 24 hours: Temp:  [99.1 F (37.3 C)-100.7 F (38.2 C)] 99.1 F (37.3 C) (02/02 0523) Pulse Rate:  [80-84] 80 (02/02 0523) Resp:  [16-20] 16 (02/02 0523) BP: (108-126)/(54-63) 119/59 (02/02 0523) SpO2:  [92 %-100 %] 92 % (02/02 0523)  Intake/Output from previous day: 02/01 0701 - 02/02 0700 In: 420 [P.O.:420] Out: 375 [Urine:375] Intake/Output this shift: Total I/O In: 292 [P.O.:292] Out: -    Recent Labs  01/21/17 1154 01/22/17 0534 01/23/17 0336 01/24/17 0559  HGB 13.9 8.7* 9.2* 8.1*    Recent Labs  01/23/17 0336 01/24/17 0559  WBC 8.3 8.9  RBC 2.77* 2.41*  HCT 27.0* 23.4*  PLT 205 189    Recent Labs  01/23/17 0336 01/24/17 0559  NA 138 136  K 4.0 3.3*  CL 106 102  CO2 29 27  BUN 8 <5*  CREATININE 0.82 0.72  GLUCOSE 133* 125*  CALCIUM 8.2* 8.0*    Recent Labs  01/21/17 1154  INR 0.93    Neurologically intact Neurovascular intact Sensation intact distally Intact pulses distally Dorsiflexion/Plantar flexion intact Incision: dressing C/D/I  Assessment/Plan: 3 Days Post-Op Procedure(s) (LRB): TOTAL KNEE ARTHROPLASTY (Left) Discharge home with home health  Biagio Borg 01/24/2017, 10:38 AM

## 2017-01-24 NOTE — Progress Notes (Signed)
Physical Therapy Treatment Patient Details Name: [name] [name] MRN: [id number] DOB: [birth year] Today's Date: 01/24/2017    History of Present Illness Admitted for L TKA, 50%PWB;  has a past medical history of Anxiety; Arthritis; Bell's palsy (2016); Cancer ([place]); Depression; Hypercholesteremia; and PONV (postoperative nausea and vomiting).  has a past surgical history that includes Knee surgery; Foot surgery (Bilateral); Plantar fascia release (Right, 09/27/2014);     PT Comments    Pt is POD 3 and continues to be moving slowly with therapy. Pt is able to perform gait and transfer activities, but is limited side effects of medications. Pt closes her eyes multiple times during gait and transfer activities. Pt will benefit from an additional therapy visit to focus on stair negotiation training.    Follow Up Recommendations  Home health PT;Supervision/Assistance - 24 hour     Equipment Recommendations  3in1 (PT)    Recommendations for Other Services       Precautions / Restrictions Precautions Precautions: Knee Precaution Booklet Issued: Yes (comment) Precaution Comments: reviewed no pillow under knee Restrictions Weight Bearing Restrictions: Yes LLE Weight Bearing: Partial weight bearing LLE Partial Weight Bearing Percentage or Pounds: 50    Mobility  Bed Mobility Overal bed mobility: Needs Assistance Bed Mobility: Supine to Sit     Supine to sit: Supervision Sit to supine: Supervision   General bed mobility comments: supervision this session. Pt is able to get EOB with increased time.   Transfers Overall transfer level: Needs assistance Equipment used: Rolling walker (2 wheeled) Transfers: Sit to/from Stand Sit to Stand: Min guard         General transfer comment: Cues for hand placement and safety  Ambulation/Gait Ambulation/Gait assistance: Min guard Ambulation Distance (Feet): 150 Feet Assistive device: Rolling walker (2 wheeled) Gait Pattern/deviations:  Step-to pattern;Antalgic Gait velocity: decreased Gait velocity interpretation: Below normal speed for age/gender General Gait Details: Continues to require cues for sequencing and heel strike. Pt with slow cadence and stops multiple times with eye closed.    Stairs            Wheelchair Mobility    Modified Rankin (Stroke Patients Only)       Balance                                    Cognition Arousal/Alertness: Lethargic;Suspect due to medications Behavior During Therapy: [facility] for tasks assessed/performed Overall Cognitive Status: No family/caregiver present to determine baseline cognitive functioning Area of Impairment: Orientation Orientation Level: Disoriented to;Time                  Exercises Total Joint Exercises Ankle Circles/Pumps: AROM;Both;20 reps;Supine Quad Sets: AAROM;5 reps;Supine;Left Heel Slides: AAROM;Left;10 reps;Supine Goniometric ROM: 2-45    General Comments        Pertinent Vitals/Pain Pain Assessment: 0-10 Pain Score: 7  Pain Location: L knee Pain Descriptors / Indicators: Aching;Guarding;Grimacing;Sore Pain Intervention(s): Monitored during session;Premedicated before session;Ice applied    Home Living                      Prior Function            PT Goals (current goals can now be found in the care plan section) Acute Rehab PT Goals Patient Stated Goal: get back to what she loves (working out, tennis) Progress towards PT goals: Progressing toward goals    Frequency    7X/week  PT Plan Current plan remains appropriate    Co-evaluation             End of Session Equipment Utilized During Treatment: Gait belt Activity Tolerance: Patient limited by lethargy Patient left: in bed;with call bell/phone within reach     Time: 0916-0955 PT Time Calculation (min) (ACUTE ONLY): 39 min  Charges:  $Gait Training: 23-37 mins $Therapeutic Exercise: 8-22 mins                    G  Codes:      Scheryl Marten PT, DPT  [phone]  01/24/2017, 10:01 AM

## 2017-01-24 NOTE — Progress Notes (Signed)
Occupational Therapy Treatment Patient Details Name: [name] [name] MRN: [id number] DOB: [birth year] Today's Date: 01/24/2017    History of present illness Admitted for L TKA, 50%PWB;  has a past medical history of Anxiety; Arthritis; Bell's palsy (2016); Cancer ([place]); Depression; Hypercholesteremia; and PONV (postoperative nausea and vomiting).  has a past surgical history that includes Knee surgery; Foot surgery (Bilateral); Plantar fascia release (Right, 09/27/2014);    OT comments  Pt. Able to complete bed mobility, toileting, and grooming tasks S level.  Note d/c home later today after final PT session.    Follow Up Recommendations  Home health OT;Supervision - Intermittent    Equipment Recommendations  3 in 1 bedside commode;Tub/shower seat    Recommendations for Other Services      Precautions / Restrictions Precautions Precautions: Knee Precaution Booklet Issued: Yes (comment) Precaution Comments: reviewed no pillow under knee Restrictions Weight Bearing Restrictions: Yes LLE Weight Bearing: Partial weight bearing LLE Partial Weight Bearing Percentage or Pounds: 50       Mobility Bed Mobility Overal bed mobility: Needs Assistance Bed Mobility: Supine to Sit     Supine to sit: Supervision Sit to supine: Supervision   General bed mobility comments: pt. in CPM machine upon arrival.  assisted pt. out of machine, pt. able to bring b les to eob with hob flat no rails and scoot to eob  Transfers Overall transfer level: Needs assistance Equipment used: Rolling walker (2 wheeled) Transfers: Sit to/from [facility] Sit to Stand: Supervision Stand pivot transfers: Supervision       General transfer comment: Cues for hand placement and safety    Balance                                   ADL Overall ADL's : Needs assistance/impaired     Grooming: Wash/dry hands;Standing;Supervision/safety         Lower Body Bathing Details (indicate  cue type and reason): discussed A/E, pt. states "oh honey i can bathe and dress my self no problem"       Lower Body Dressing Details (indicate cue type and reason): discussed A/E, pt. states "oh honey i can bathe and dress my self no problem" Toilet Transfer: Supervision/safety;Ambulation;RW;BSC;Regular Toilet   Toileting- Water quality scientist and Hygiene: Supervision/safety;Sit to/from Nurse, children's Details (indicate cue type and reason): pt. declined stating "she already knows how to do all of that" Functional mobility during ADLs: Supervision/safety;Rolling walker General ADL Comments: during toileting task. pt. states "can you close the door all the way please".  i reviewed that i was in the doorway for safety but i would close the door as long as she promised to tell me when she was ready to stand up.  next thing i hear is "okay im up" i open the door and pt. has stood, performed pericare and amb. to sink without me present.  i said "oh no you promised you would wait for me".  pt. began yelling "you know what, im not going to take this from you, im so sick and tired of people coming for me and telling me what to do".  in the middle of yelling at me she began to cry hysterically and saying no one has her back and shes all alone in the world and how she was surprised that i was concerned because no one cares about her. "you know what im sorry I  forgot to tell you I was ready its the anesthesia".   i reviewed i was concerned and had requested she wait for me because with her PWB status i wanted to be present during ambulation to ensure safety.  she continued to list all of the horrible things that had recently occured in her life ie: several family members passing away and her husband leaving her for another woman.  assisted her to bed per her request.  provided emotional support, held her hand while she cried and told her story, also hugged her.  pt. requested diet coke and ice.   before i left to get those items she then began to ask multiple questions regarding her d/c. who is her [facility] agency? can she have a muscle relaxer? wil her rx be called in to [facility].  rn made aware of all questions.  i reviewed with pt. all questions would be answered by rn for d/c.  before i left the room after giving her her soda i said is everything ok, (referring to her and therapy).  she smirked at me and said "dont worry your'e not in trouble".  i said "oh, okay thank you i didnt think i was".  i reiterated that in the b.room my priority was her safety.  when i left the room her mood was happy and she stated she had no further questions.  rn made aware of my session with her.         Vision                     Perception     Praxis      Cognition   Behavior During Therapy: Anxious;Agitated;Impulsive Overall Cognitive Status: No family/caregiver present to determine baseline cognitive functioning Area of Impairment: Orientation Orientation Level: Disoriented to;Time Current Attention Level: Alternating      Safety/Judgement: Decreased awareness of safety   Problem Solving: Requires verbal cues;Requires tactile cues;Difficulty sequencing General Comments: continues to require cues to attend to tasks. Moves quickly and continus to have decreased safety awareness.     Extremity/Trunk Assessment               Exercises Total Joint Exercises Ankle Circles/Pumps: AROM;Both;20 reps;Supine Quad Sets: AAROM;5 reps;Supine;Left Heel Slides: AAROM;Left;10 reps;Supine Goniometric ROM: 2-45   Shoulder Instructions       General Comments      Pertinent Vitals/ Pain       Pain Assessment: No/denies pain Pain Score: 7  Pain Location: L knee Pain Descriptors / Indicators: Aching;Guarding;Grimacing;Sore Pain Intervention(s): Monitored during session;Premedicated before session;Ice applied  Home Living                                          Prior  Functioning/Environment              Frequency  Min 2X/week        Progress Toward Goals  OT Goals(current goals can now be found in the care plan section)  Progress towards OT goals: Progressing toward goals  Acute Rehab OT Goals Patient Stated Goal: get back to what she loves (working out, tennis)  Plan Discharge plan remains appropriate    Co-evaluation                 End of Session Equipment Utilized During Treatment: Gait belt;Rolling walker CPM Left Knee CPM Left Knee: Off  Activity Tolerance Patient tolerated treatment well   Patient Left in bed;with call bell/phone within reach   Nurse Communication Other (comment) (see adl notes)        Time: [id number] OT Time Calculation (min): 31 min  Charges: OT General Charges $OT Visit: 1 Procedure OT Treatments $Self Care/Home Management : 23-37 mins  Janice Coffin, COTA/L 01/24/2017, 11:22 AM

## 2017-01-24 NOTE — Discharge Summary (Signed)
[name], MD   Biagio Borg, PA-C [address] [place], [place]  [address]                             [phone]  PATIENT ID: [name] [name]        MRN:  [id number]          DOB/AGE: 1966-10-28 / 51 y.o.    DISCHARGE SUMMARY  ADMISSION DATE:    01/21/2017 DISCHARGE DATE:   01/24/2017   ADMISSION DIAGNOSIS: LEFT KNEE OSTEOARTHRITIS    DISCHARGE DIAGNOSIS:  LEFT KNEE OSTEOARTHRITIS    ADDITIONAL DIAGNOSIS: Principal Problem:   Unilateral primary osteoarthritis, left knee Active Problems:   S/P total knee replacement using cement, left  Past Medical History:  Diagnosis Date  . Anxiety   . Arthritis   . Bell's palsy 2016   left side   . Cancer ([place])    skin cancer- left temporal " sitting on bone"  . Depression   . Hypercholesteremia   . PONV (postoperative nausea and vomiting)    would like scope patch    PROCEDURE: Procedure(s): TOTAL KNEE ARTHROPLASTY Left on 01/21/2017  CONSULTS: none    HISTORY: Patient is a 51 y.o. female presented with a history of pain in the left knee for 7 years. Onset of symptoms was gradual starting 6 years ago with gradually worsening course since that time. Prior procedures on the knee are arthroscopy. Patient has been treated conservatively with over-the-counter NSAIDs and activity modification. Patient currently rates pain in the knee at 8 out of 10 with activity. There is pain at night. present.  HOSPITAL COURSE:  [name] [name] is a 51 y.o. admitted on 01/21/2017 and found to have a diagnosis of LEFT KNEE OSTEOARTHRITIS.  After appropriate laboratory studies were obtained  they were taken to the operating room on 01/21/2017 and underwent  Procedure(s): TOTAL KNEE ARTHROPLASTY  Left.   They were given perioperative antibiotics:  Anti-infectives    Start     Dose/Rate Route Frequency Ordered Stop   01/21/17 2030  ceFAZolin (ANCEF) IVPB 2g/100 mL premix     2 g 200 mL/hr over 30 Minutes Intravenous Every 6 hours 01/21/17 1957  01/22/17 0302   01/21/17 1138  ceFAZolin (ANCEF) IVPB 2g/100 mL premix     2 g 200 mL/hr over 30 Minutes Intravenous On call to O.R. 01/21/17 1138 01/21/17 1320    .  Tolerated the procedure well.  Placed with a foley intraoperatively.    Toradol was given post op.  POD #1, allowed out of bed to a chair.  PT for ambulation and exercise program.  Foley D/C'd in morning.  IV saline locked.  O2 discontionued.  POD #2, continued PT and ambulation.   Hemovac pulled. Needed more pain control and PT . POD #3, continued PT and ambulation The remainder of the hospital course was dedicated to ambulation and strengthening.   The patient was discharged on 3 Days Post-Op in  Stable condition.  Blood products given:none  DIAGNOSTIC STUDIES: Recent vital signs: Patient Vitals for the past 24 hrs:  BP Temp Temp src Pulse Resp SpO2  01/24/17 0523 (!) 119/59 99.1 F (37.3 C) Oral 80 16 92 %  01/23/17 2304 - 99.4 F (37.4 C) Oral - - -  01/23/17 2000 (!) 126/54 (!) 100.7 F (38.2 C) Oral 81 18 94 %  01/23/17 1415 108/63 (!) 100.5 F (38.1 C) Oral 84 20 100 %  Recent laboratory studies:  Recent Labs  01/21/17 1154 01/22/17 0534 01/23/17 0336 01/24/17 0559  WBC 6.2 6.9 8.3 8.9  HGB 13.9 8.7* 9.2* 8.1*  HCT 39.9 26.1* 27.0* 23.4*  PLT 256 180 205 189    Recent Labs  01/21/17 1154 01/22/17 0534 01/23/17 0336 01/24/17 0559  NA 140 128* 138 136  K 3.7 3.4* 4.0 3.3*  CL 109 94* 106 102  CO2 24 27 29 27   BUN 11 9 8  <5*  CREATININE 0.87 0.77 0.82 0.72  GLUCOSE 93 104* 133* 125*  CALCIUM 9.7 7.8* 8.2* 8.0*   Lab Results  Component Value Date   INR 0.93 01/21/2017     Recent Radiographic Studies :  Xr Knee 1-2 Views Left  Result Date: 01/02/2017 AP lateral left knee reviewed.  Merchant also reviewed.  Patellofemoral arthritis is present.  There some flattening of the lateral femoral condyle noted on the AP.  Medial lateral joint space is otherwise maintained.  Bone density  looks reasonable.   DISCHARGE INSTRUCTIONS: Discharge Instructions    CPM    Complete by:  As directed    Continuous passive motion machine (CPM):      Use the CPM from 0 to 60 degrees for 6-8 hours per day.      You may increase by 5-10 per day.  You may break it up into 2 or 3 sessions per day.      Use CPM for 3-4 weeks or until you are told to stop.   Call MD / Call 911    Complete by:  As directed    If you experience chest pain or shortness of breath, CALL 911 and be transported to the hospital emergency room.  If you develope a fever above 101 F, pus (white drainage) or increased drainage or redness at the wound, or calf pain, call your surgeon's office.   Change dressing    Complete by:  As directed    DO NOT CHANGE YOUR DRESSING.   Constipation Prevention    Complete by:  As directed    Drink plenty of fluids.  Prune juice may be helpful.  You may use a stool softener, such as Colace (over the counter) 100 mg twice a day.  Use MiraLax (over the counter) for constipation as needed.   Diet general    Complete by:  As directed    Discharge instructions    Complete by:  As directed    [place] items at home which could result in a fall. This includes throw rugs or furniture in walking pathways ICE to the affected joint every three hours while awake for 30 minutes at a time, for at least the first 3-5 days, and then as needed for pain and swelling.  Continue to use ice for pain and swelling. You may notice swelling that will progress down to the foot and ankle.  This is normal after surgery.  Elevate your leg when you are not up walking on it.   Continue to use the breathing machine you got in the hospital (incentive spirometer) which will help keep your temperature down.  It is common for your temperature to cycle up and down following surgery, especially at night when you are not up moving around and exerting yourself.  The breathing machine  keeps your lungs expanded and your temperature down.   DIET:  As you were doing prior to hospitalization, we recommend a well-balanced diet.  DRESSING /  WOUND CARE / SHOWERING  Keep the surgical dressing until follow up.  The dressing is water proof, so you can shower without any extra covering.  IF THE DRESSING FALLS OFF or the wound gets wet inside, change the dressing with sterile gauze.  Please use good hand washing techniques before changing the dressing.  Do not use any lotions or creams on the incision until instructed by your surgeon.    ACTIVITY  Increase activity slowly as tolerated, but follow the weight bearing instructions below.   No driving for 6 weeks or until further direction given by your physician.  You cannot drive while taking narcotics.  No lifting or carrying greater than 10 lbs. until further directed by your surgeon. Avoid periods of inactivity such as sitting longer than an hour when not asleep. This helps prevent blood clots.  You may return to work once you are authorized by your doctor.     WEIGHT BEARING   Partial weight bearing with assist device as directed.  50%   EXERCISES  Results after joint replacement surgery are often greatly improved when you follow the exercise, range of motion and muscle strengthening exercises prescribed by your doctor. Safety measures are also important to protect the joint from further injury. Any time any of these exercises cause you to have increased pain or swelling, decrease what you are doing until you are comfortable again and then slowly increase them. If you have problems or questions, call your caregiver or physical therapist for advice.   Rehabilitation is important following a joint replacement. After just a few days of immobilization, the muscles of the leg can become weakened and shrink (atrophy).  These exercises are designed to build up the tone and strength of the thigh and leg muscles and to improve motion.  Often times heat used for twenty to thirty minutes before working out will loosen up your tissues and help with improving the range of motion but do not use heat for the first two weeks following surgery (sometimes heat can increase post-operative swelling).   These exercises can be done on a training (exercise) mat,  on a table or on a bed. Use whatever works the best and is most comfortable for you.    Use music or television while you are exercising so that the exercises are a pleasant break in your day. This will make your life better with the exercises acting as a break in your routine that you can look forward to.   Perform all exercises about fifteen times, three times per day or as directed.  You should exercise both the operative leg and the other leg as well.   Exercises include:  Quad Sets - Tighten up the muscle on the front of the thigh (Quad) and hold for 5-10 seconds.   Straight Leg Raises - With your knee straight (if you were given a brace, keep it on), lift the leg to 60 degrees, hold for 3 seconds, and slowly lower the leg.  Perform this exercise against resistance later as your leg gets stronger.  Leg Slides: Lying on your back, slowly slide your foot toward your buttocks, bending your knee up off the floor (only go as far as is comfortable). Then slowly slide your foot back down until your leg is flat on the floor again.  Angel Wings: Lying on your back spread your legs to the side as far apart as you can without causing discomfort.  Hamstring Strength:  Lying on your back,  push your heel against the floor with your leg straight by tightening up the muscles of your buttocks.  Repeat, but this time bend your knee to a comfortable angle, and push your heel against the floor.  You may put a pillow under the heel to make it more comfortable if necessary.   A rehabilitation program following joint replacement surgery can speed recovery and prevent re-injury in the future due to weakened  muscles. Contact your doctor or a physical therapist for more information on knee rehabilitation.    CONSTIPATION  Constipation is defined medically as fewer than three stools per week and severe constipation as less than one stool per week.  Even if you have a regular bowel pattern at home, your normal regimen is likely to be disrupted due to multiple reasons following surgery.  Combination of anesthesia, postoperative narcotics, change in appetite and fluid intake all can affect your bowels.   YOU MUST use at least one of the following options; they are listed in order of increasing strength to get the job done.  They are all available over the counter, and you may need to use some, POSSIBLY even all of these options:    Drink plenty of fluids (prune juice may be helpful) and high fiber foods Colace 100 mg by mouth twice a day  Senokot for constipation as directed and as needed Dulcolax (bisacodyl), take with full glass of water  Miralax (polyethylene glycol) once or twice a day as needed.  If you have tried all these things and are unable to have a bowel movement in the first 3-4 days after surgery call either your surgeon or your primary doctor.    If you experience loose stools or diarrhea, hold the medications until you stool forms back up.  If your symptoms do not get better within 1 week or if they get worse, check with your doctor.  If you experience "the worst abdominal pain ever" or develop nausea or vomiting, please contact the office immediately for further recommendations for treatment.   ITCHING:  If you experience itching with your medications, try taking only a single pain pill, or even half a pain pill at a time.  You can also use Benadryl over the counter for itching or also to help with sleep.   TED HOSE STOCKINGS:  Use stockings on both legs until for at least 2 weeks or as directed by physician office. They may be removed at night for sleeping.  MEDICATIONS:  See your  medication summary on the "After Visit Summary" that nursing will review with you.  You may have some home medications which will be placed on hold until you complete the course of blood thinner medication.  It is important for you to complete the blood thinner medication as prescribed.  PRECAUTIONS:  If you experience chest pain or shortness of breath - call 911 immediately for transfer to the hospital emergency department.   If you develop a fever greater that 101 F, purulent drainage from wound, increased redness or drainage from wound, foul odor from the wound/dressing, or calf pain - CONTACT YOUR SURGEON.                                                   FOLLOW-UP APPOINTMENTS:  If you do not already have a post-op appointment, please call the  office for an appointment to be seen by your surgeon.  Guidelines for how soon to be seen are listed in your "After Visit Summary", but are typically between 1-4 weeks after surgery.  OTHER INSTRUCTIONS:   Knee Replacement:  Do not place pillow under knee, focus on keeping the knee straight while resting. CPM instructions: 0-90 degrees, 2 hours in the morning, 2 hours in the afternoon, and 2 hours in the evening. Place foam block, curve side up under heel at all times except when in CPM or when walking.  DO NOT modify, tear, cut, or change the foam block in any way.  MAKE SURE YOU:  Understand these instructions.  Get help right away if you are not doing well or get worse.    Thank you for letting us be a part of your medical care team.  It is a privilege we respect greatly.  We hope these instructions will help you stay on track for a fast and full recovery!   Do not put a pillow under the knee. Place it under the heel.    Complete by:  As directed    Driving restrictions    Complete by:  As directed    No driving for 6 weeks   Increase activity slowly as tolerated    Complete by:  As directed    Lifting restrictions    Complete by:  As  directed    No lifting for 6 weeks   Partial weight bearing    Complete by:  As directed    % Body Weight:  50%   Laterality:  left   Extremity:  Lower   Patient may shower    Complete by:  As directed    You may shower over your dressing   TED hose    Complete by:  As directed    Use stockings (TED hose) for 3 weeks on left  leg.  You may remove them at night for sleeping.      DISCHARGE MEDICATIONS:   Allergies as of 01/24/2017      Reactions   Decadron [dexamethasone Sodium Phosphate] Anaphylaxis      Medication List    STOP taking these medications   ibuprofen 800 MG tablet Commonly known as:  ADVIL,MOTRIN     TAKE these medications   clonazePAM 1 MG tablet Commonly known as:  KLONOPIN Take 1 mg by mouth 4 (four) times daily as needed for anxiety.   desvenlafaxine 100 MG 24 hr tablet Commonly known as:  PRISTIQ Take 100 mg by mouth daily.   methocarbamol 500 MG tablet Commonly known as:  ROBAXIN Take 1 tablet (500 mg total) by mouth every 8 (eight) hours as needed for muscle spasms.   oxyCODONE 5 MG immediate release tablet Commonly known as:  Oxy IR/ROXICODONE Take 1-2 tablets (5-10 mg total) by mouth every 4 (four) hours as needed for breakthrough pain.   rivaroxaban 10 MG Tabs tablet Commonly known as:  XARELTO Take 1 tablet (10 mg total) by mouth daily with breakfast.   traZODone 100 MG tablet Commonly known as:  DESYREL Take 100 mg by mouth at bedtime as needed for sleep.            Durable Medical Equipment        Start     Ordered   01/21/17 1958  DME Walker rolling  Once    Question:  Patient needs a walker to treat with the following condition  Answer:  S/P  total knee replacement using cement, right   01/21/17 1957   01/21/17 1958  DME 3 n 1  Once     01/21/17 1957   01/21/17 1958  DME Bedside commode  Once    Question:  Patient needs a bedside commode to treat with the following condition  Answer:  S/P total knee replacement using  cement, right   01/21/17 1957      FOLLOW UP VISIT:   Follow-up Information    [facility] Follow up.   Specialty:  [place] Why:  Someone from [facility] will contact you to arrange start date and time for therapy. Contact information: [place] [address] [phone]           DISPOSITION:   Home  CONDITION:  Stable   Mike Craze. [place], [place] [phone]  01/24/2017 10:45 AM

## 2017-01-24 NOTE — Progress Notes (Signed)
Physical Therapy Treatment Patient Details Name: [name] [name] MRN: [id number] DOB: 1966-02-02 Today's Date: 01/24/2017    History of Present Illness Admitted for L TKA, 50%PWB;  has a past medical history of Anxiety; Arthritis; Bell's palsy (2016); Cancer ([place]); Depression; Hypercholesteremia; and PONV (postoperative nausea and vomiting).  has a past surgical history that includes Knee surgery; Foot surgery (Bilateral); Plantar fascia release (Right, 09/27/2014);     PT Comments    Pt is able to perform stair negotiation training with min guard assistance. Pt is making slow progress, but is not very motivated to advance gait and knee ROM activities. Pt continues to require assistance for safety awareness.    Follow Up Recommendations  Home health PT;Supervision/Assistance - 24 hour     Equipment Recommendations  3in1 (PT)    Recommendations for Other Services       Precautions / Restrictions Precautions Precautions: Knee Precaution Booklet Issued: Yes (comment) Precaution Comments: reviewed no pillow under knee Restrictions Weight Bearing Restrictions: Yes LLE Weight Bearing: Partial weight bearing LLE Partial Weight Bearing Percentage or Pounds: 50    Mobility  Bed Mobility Overal bed mobility: Needs Assistance Bed Mobility: Supine to Sit     Supine to sit: Supervision     General bed mobility comments: pt. in CPM machine upon arrival.  assisted pt. out of machine, pt. able to bring b les to eob with hob flat no rails and scoot to eob  Transfers Overall transfer level: Needs assistance Equipment used: Rolling walker (2 wheeled) Transfers: Sit to/from [facility] Sit to Stand: Supervision Stand pivot transfers: Supervision       General transfer comment: Cues for hand placement and safety  Ambulation/Gait                 Stairs Stairs: Yes   Stair Management: Two rails;Forwards Number of Stairs: 2 General stair comments: requires  cues for proper sequencing  Wheelchair Mobility    Modified Rankin (Stroke Patients Only)       Balance                                    Cognition Arousal/Alertness: Awake/alert Behavior During Therapy: Anxious;Agitated;Impulsive Overall Cognitive Status: No family/caregiver present to determine baseline cognitive functioning Area of Impairment: Orientation Orientation Level: Disoriented to;Time Current Attention Level: Alternating     Safety/Judgement: Decreased awareness of safety   Problem Solving: Requires verbal cues;Requires tactile cues;Difficulty sequencing General Comments: continues to require cues to attend to tasks. Moves quickly and continus to have decreased safety awareness.     Exercises      General Comments        Pertinent Vitals/Pain Pain Assessment: Faces Faces Pain Scale: Hurts even more Pain Location: L knee Pain Descriptors / Indicators: Aching;Guarding;Grimacing;Sore Pain Intervention(s): Monitored during session;Limited activity within patient's tolerance;Ice applied;Repositioned;Patient requesting pain meds-RN notified    Home Living                      Prior Function            PT Goals (current goals can now be found in the care plan section) Acute Rehab PT Goals Patient Stated Goal: get back to what she loves (working out, tennis) Progress towards PT goals: Progressing toward goals    Frequency    7X/week      PT Plan Current plan remains appropriate  Co-evaluation             End of Session Equipment Utilized During Treatment: Gait belt Activity Tolerance: Patient limited by lethargy Patient left: in chair;with call bell/phone within reach;with family/visitor present     Time: 0115-0147 PT Time Calculation (min) (ACUTE ONLY): 32 min  Charges:  $Gait Training: 8-22 mins $Therapeutic Activity: 8-22 mins                    G Codes:      Scheryl Marten PT, DPT   [phone]  01/24/2017, 2:03 PM

## 2017-01-27 ENCOUNTER — Encounter (HOSPITAL_COMMUNITY): Payer: Self-pay | Admitting: Orthopaedic Surgery

## 2017-01-27 ENCOUNTER — Telehealth (INDEPENDENT_AMBULATORY_CARE_PROVIDER_SITE_OTHER): Payer: Self-pay | Admitting: Orthopaedic Surgery

## 2017-01-27 NOTE — Telephone Encounter (Signed)
[name] [name] from [facility] called requesting a verbal physical therapy order for the following:  3x a week for 2 weeks.  Cb# [phone]

## 2017-01-27 NOTE — Telephone Encounter (Signed)
Called [facility], [place] for PT

## 2017-02-03 ENCOUNTER — Ambulatory Visit (INDEPENDENT_AMBULATORY_CARE_PROVIDER_SITE_OTHER): Payer: Self-pay

## 2017-02-03 ENCOUNTER — Encounter (INDEPENDENT_AMBULATORY_CARE_PROVIDER_SITE_OTHER): Payer: Self-pay | Admitting: Orthopaedic Surgery

## 2017-02-03 ENCOUNTER — Ambulatory Visit (INDEPENDENT_AMBULATORY_CARE_PROVIDER_SITE_OTHER): Payer: BLUE CROSS/BLUE SHIELD | Admitting: Orthopaedic Surgery

## 2017-02-03 VITALS — BP 114/85 | HR 94 | Resp 16 | Ht 65.0 in | Wt 178.0 lb

## 2017-02-03 DIAGNOSIS — Z96652 Presence of left artificial knee joint: Secondary | ICD-10-CM

## 2017-02-03 MED ORDER — HYDROCODONE-ACETAMINOPHEN 5-325 MG PO TABS: 1.0000 | ORAL_TABLET | Freq: Four times a day (QID) | ORAL | 0 refills | Status: DC | PRN

## 2017-02-03 NOTE — Progress Notes (Signed)
Office Visit Note   Patient: [name] [name]           Date of Birth: Feb 06, 1966           MRN: [id number] Visit Date: 02/03/2017              Requested by: No referring provider defined for this encounter. PCP: Frazier Butt, MD   Assessment & Plan: Visit Diagnoses: 2 weeks status post primary left total knee replacement   Plan: Finish home health therapy this week and follow up with outpatient therapy at [facility] facility in [facility] starting next week. Her goal is to flex her knee beyond 90. She'll continue with partial weightbearing using a walker. Prescription for hydrocodone for pain   Follow-Up Instructions: No Follow-up on file.   Orders:  No orders of the defined types were placed in this encounter.  No orders of the defined types were placed in this encounter.     Procedures: No procedures performed   Clinical Data: No additional findings.   Subjective: Chief Complaint  Patient presents with  . Left Knee - Routine Post Op    [name] [name] is 2 weeks status post left Total knee replacement. Staples removed, steri strips applied  No related fever or chills or shortness of breath. No calf pain. Denies numbness or tingling.  Review of Systems   Objective: Vital Signs: BP 114/85 (BP Location: Left Arm, Patient Position: Sitting, Cuff Size: Normal)   Pulse 94   Resp 16   Ht 5\' 5"  (1.651 m)   Wt 175 lb (79.4 kg)   LMP 01/23/2015 Comment: perimenopausal (very irregular), no chance pregnant  BMI 29.12 kg/m   Physical Exam  Ortho Exam left knee exam without significant effusion. Incision looks just fine. We will plan to remove staples and apply Steri-Strips over benzoin. No calf pain. Neurovascular exam intact distally. No distal edema.No thigh pain. No evidence of instability. Films of her left knee reveal excellent position of the components. Intraoperatively there was a small split fracture of the medial tibial plateau. it was nondisplaced. It was  fixed with a single screw and appears to be in excellent position Specialty Comments:  No specialty comments available.  Imaging: No results found.   PMFS History: Patient Active Problem List   Diagnosis Date Noted  . Unilateral primary osteoarthritis, left knee 01/21/2017  . S/P total knee replacement using cement, left 01/21/2017  . Chondromalacia patellae, left knee 01/02/2017  . Torn meniscus 06/29/2015  . Osteochondral defect of femoral condyle 06/29/2015  . Plantar fasciitis of right foot 09/27/2014   Past Medical History:  Diagnosis Date  . Anxiety   . Arthritis   . Bell's palsy 2016   left side   . Cancer ([place])    skin cancer- left temporal " sitting on bone"  . Depression   . Hypercholesteremia   . PONV (postoperative nausea and vomiting)    would like scope patch    No family history on file.  Past Surgical History:  Procedure Laterality Date  . CHONDROPLASTY Left 06/29/2015   Procedure: CHONDROPLASTY;  Surgeon: Garald Balding, MD;  Location: [place];  Service: Orthopedics;  Laterality: Left;  . FOOT SURGERY Bilateral    Bone Spurs  . KNEE ARTHROSCOPY WITH DRILLING/MICROFRACTURE Left 06/29/2015   Procedure: KNEE ARTHROSCOPY WITH DRILLING/MICROFRACTURE;  Surgeon: Garald Balding, MD;  Location: [place];  Service: Orthopedics;  Laterality: Left;  . KNEE ARTHROSCOPY WITH LATERAL MENISECTOMY  Left 06/29/2015   Procedure: KNEE ARTHROSCOPY WITH LATERAL MENISECTOMY;  Surgeon: Garald Balding, MD;  Location: [place];  Service: Orthopedics;  Laterality: Left;  . KNEE ARTHROSCOPY WITH LATERAL RELEASE Left 06/29/2015   Procedure: KNEE ARTHROSCOPY WITH LATERAL RELEASE;  Surgeon: Garald Balding, MD;  Location: [place];  Service: Orthopedics;  Laterality: Left;  . KNEE SURGERY    . Mohls Left    face  . PLANTAR FASCIA RELEASE Right 09/27/2014   Procedure: PLANTAR FASCIA RELEASE;  Surgeon: Garald Balding, MD;  Location: [place];  Service: Orthopedics;  Laterality: Right;  . TONSILLECTOMY    . TOTAL KNEE ARTHROPLASTY Left 01/21/2017   Procedure: TOTAL KNEE ARTHROPLASTY;  Surgeon: Garald Balding, MD;  Location: [place];  Service: Orthopedics;  Laterality: Left;  . Tummy tuck     2010   Social History   Occupational History  . Not on file.   Social History Main Topics  . Smoking status: Never Smoker  . Smokeless tobacco: Never Used  . Alcohol use No  . Drug use: No  . Sexual activity: Not Currently

## 2017-02-05 ENCOUNTER — Telehealth (INDEPENDENT_AMBULATORY_CARE_PROVIDER_SITE_OTHER): Payer: Self-pay | Admitting: Radiology

## 2017-02-05 ENCOUNTER — Telehealth (INDEPENDENT_AMBULATORY_CARE_PROVIDER_SITE_OTHER): Payer: Self-pay | Admitting: Orthopedic Surgery

## 2017-02-05 NOTE — Telephone Encounter (Signed)
Spoke with [facility] PT Anderson Malta and Pt was upstairs so she was confused as to the WB status.

## 2017-02-05 NOTE — Telephone Encounter (Signed)
Patient states the physical therapist needs to know her weight bearing status of left leg.

## 2017-02-05 NOTE — Telephone Encounter (Signed)
[name] [name] physical therapist calling from [facility] health wanting clarification of weightbearing status. Advised per office note on 02/03/17 patient would begin partial weightbearing with a walker.

## 2017-02-05 NOTE — Telephone Encounter (Signed)
[facility] [phone] Anderson Malta

## 2017-02-06 ENCOUNTER — Encounter (HOSPITAL_COMMUNITY): Payer: Self-pay | Admitting: Orthopaedic Surgery

## 2017-02-11 ENCOUNTER — Ambulatory Visit: Payer: BLUE CROSS/BLUE SHIELD | Attending: Orthopaedic Surgery | Admitting: Physical Therapy

## 2017-02-11 DIAGNOSIS — M25662 Stiffness of left knee, not elsewhere classified: Secondary | ICD-10-CM

## 2017-02-11 DIAGNOSIS — M6281 Muscle weakness (generalized): Secondary | ICD-10-CM | POA: Diagnosis present

## 2017-02-11 DIAGNOSIS — R262 Difficulty in walking, not elsewhere classified: Secondary | ICD-10-CM

## 2017-02-11 DIAGNOSIS — M25562 Pain in left knee: Secondary | ICD-10-CM

## 2017-02-11 DIAGNOSIS — R2689 Other abnormalities of gait and mobility: Secondary | ICD-10-CM

## 2017-02-11 NOTE — Patient Instructions (Signed)
Strengthening: Quadriceps Set    Tighten muscles on top of thighs by pushing knees down into surface. Hold __5__ seconds. Repeat __15__ times per set. . Do _2-3___ sessions per day.   HIP / KNEE: Flexion, Heel Slides - Supine    Slide heel up toward buttocks, keeping leg in straight line. _15__ reps per set, __2-3_ sets per day. Use towel or pillowcase under heel as needed.    Straight Leg Raise    Tighten stomach and slowly raise locked right leg _6___ inches from floor. Repeat __10-15__ times per set. Do _2-3___ sessions per day.   Short Arc [employer] a large can or rolled towel under left leg. Straighten leg. Hold __5__ seconds. Repeat __15__ times. Do _2-3___ sessions per day.

## 2017-02-12 NOTE — Therapy (Signed)
[facility] [address]  [place], [place], [address] Phone: [phone]   Fax:  [phone]  Physical Therapy Evaluation  Patient Details  Name: [name] [name] MRN: [id number] Date of Birth: Sep 30, 1966 Referring Provider: Dr. Durward Fortes  Encounter Date: 02/11/2017      PT End of Session - 02/11/17 1749    Visit Number 1   Number of Visits 14   Date for PT Re-Evaluation 03/25/17   PT Start Time 0932   PT Stop Time 1030   PT Time Calculation (min) 58 min   Activity Tolerance Patient tolerated treatment well   Behavior During Therapy [facility] for tasks assessed/performed      Past Medical History:  Diagnosis Date  . Anxiety   . Arthritis   . Bell's palsy 2016   left side   . Cancer ([place])    skin cancer- left temporal " sitting on bone"  . Depression   . Hypercholesteremia   . PONV (postoperative nausea and vomiting)    would like scope patch    Past Surgical History:  Procedure Laterality Date  . CHONDROPLASTY Left 06/29/2015   Procedure: CHONDROPLASTY;  Surgeon: Garald Balding, MD;  Location: [place];  Service: Orthopedics;  Laterality: Left;  . FOOT SURGERY Bilateral    Bone Spurs  . KNEE ARTHROSCOPY WITH DRILLING/MICROFRACTURE Left 06/29/2015   Procedure: KNEE ARTHROSCOPY WITH DRILLING/MICROFRACTURE;  Surgeon: Garald Balding, MD;  Location: [place];  Service: Orthopedics;  Laterality: Left;  . KNEE ARTHROSCOPY WITH LATERAL MENISECTOMY Left 06/29/2015   Procedure: KNEE ARTHROSCOPY WITH LATERAL MENISECTOMY;  Surgeon: Garald Balding, MD;  Location: [place];  Service: Orthopedics;  Laterality: Left;  . KNEE ARTHROSCOPY WITH LATERAL RELEASE Left 06/29/2015   Procedure: KNEE ARTHROSCOPY WITH LATERAL RELEASE;  Surgeon: Garald Balding, MD;  Location: [place];  Service: Orthopedics;  Laterality: Left;  . KNEE SURGERY    . Mohls Left    face   . PLANTAR FASCIA RELEASE Right 09/27/2014   Procedure: PLANTAR FASCIA RELEASE;  Surgeon: Garald Balding, MD;  Location: [place];  Service: Orthopedics;  Laterality: Right;  . TONSILLECTOMY    . TOTAL KNEE ARTHROPLASTY Left 01/21/2017   Procedure: TOTAL KNEE ARTHROPLASTY;  Surgeon: Garald Balding, MD;  Location: [place];  Service: Orthopedics;  Laterality: Left;  . Tummy tuck     2010    There were no vitals filed for this visit.       Subjective Assessment - 02/11/17 0936    Subjective Patient s/p L TKA on 01/21/17. Had home health approx. 2 weeks - patient reporting 4 day lapse from surgery to home health arrival. Reports knee pain started in 2004. Has had multiple surgeries on L knee with little relief. Has been using CPM machine at home for flexion ROM. Has been icing at home - multiple times a day.    Limitations Walking   Currently in Pain? Yes   Pain Score 10-Worst pain ever  has been "10/10 sonce surgery"   Pain Location Knee   Pain Orientation Left   Pain Descriptors / Indicators Constant   Pain Type Surgical pain   Pain Frequency Constant   Pain Relieving Factors ice, pain meds            [facility] PT Assessment - 02/11/17 0944      Assessment   Medical Diagnosis L TKA  Referring Provider Dr. Durward Fortes   Onset Date/Surgical Date 01/21/17   Next MD Visit 02/24/17   Prior Therapy yes     Precautions   Precautions None     Restrictions   Weight Bearing Restrictions No     Balance Screen   Has the patient fallen in the past 6 months No   Has the patient had a decrease in activity level because of a fear of falling?  No   Is the patient reluctant to leave their home because of a fear of falling?  No     Home Environment   Living Environment Private residence   Living Arrangements Children   Type of [place] to enter   Entrance Stairs-Number of Steps 2   Home Layout Two level   Alternate Level Stairs-Number of Steps  14   Alternate [place] - 2 wheels;Cane - single point     Prior Function   Level of Independence Independent     Cognition   Overall Cognitive Status Within Functional Limits for tasks assessed     Observation/Other Assessments   Focus on Therapeutic Outcomes (FOTO)  Knee: 30 (70% limited, predicted 45% limited)      Coordination   Gross Motor Movements are Fluid and Coordinated Yes     ROM / Strength   AROM / PROM / Strength AROM;PROM;Strength     AROM   AROM Assessment Site Knee   Right/Left Knee Right;Left   Right Knee Extension -4   Right Knee Flexion 136   Left Knee Extension 5   Left Knee Flexion 84     PROM   PROM Assessment Site Knee   Right/Left Knee Left   Left Knee Extension 3   Left Knee Flexion 91     Strength   Strength Assessment Site Hip;Knee   Right/Left Hip Right;Left   Right Hip Flexion 4+/5   Left Hip Flexion 3/5   Right/Left Knee Right;Left   Right Knee Flexion 5/5   Right Knee Extension 5/5   Left Knee Flexion 3/5   Left Knee Extension 3/5     Ambulation/Gait   Ambulation/Gait Yes   Ambulation/Gait Assistance 5: Supervision   Ambulation Distance (Feet) 100 Feet   Assistive device Rolling walker   Gait Pattern Step-through pattern;Decreased stance time - left;Decreased stride length;Decreased weight shift to left;Left flexed knee in stance;Trunk flexed   Ambulation Surface Level;Indoor                   [facility] Adult PT Treatment/Exercise - 02/11/17 0944      Exercises   Exercises Knee/Hip     Knee/Hip Exercises: Supine   Quad Sets Left;10 reps   Short Arc Quad Sets Left;10 reps   Heel Slides Left;10 reps   Straight Leg Raises Left;10 reps     Modalities   Modalities Vasopneumatic     Vasopneumatic   Number Minutes Vasopneumatic  15 minutes   Vasopnuematic Location  Knee   Vasopneumatic Pressure High   Vasopneumatic Temperature  coldest temp                PT  Education - 02/11/17 1748    Education provided Yes   Education Details exam findings, POC, HEP   Person(s) Educated Patient   Methods Explanation;Demonstration;Handout   Comprehension Verbalized understanding;Returned demonstration;Need further instruction          PT Short Term Goals - 02/12/17  0822      PT SHORT TERM GOAL #1   Title pt to be independent with HEP (03/05/17)   Status New     PT SHORT TERM GOAL #2   Title pt to improve L knee ROM to 0-105 (03/05/17)   Status New           PT Long Term Goals - 02/12/17 [id number]      PT LONG TERM GOAL #1   Title patient to be independent with advanced HEP (03/25/17)   Status New     PT LONG TERM GOAL #2   Title Patient to improve L knee ROM to 0-120 (03/25/17)   Status New     PT LONG TERM GOAL #3   Title patient to demonstrate appropriate gait mechanics with good heel toe gait pattern with no AD (03/25/17)   Status New     PT LONG TERM GOAL #4   Title patient to demonstrate no extensor lag with SLR (03/25/17)   Status New     PT LONG TERM GOAL #5   Title patient to demonstrate ability to ascend/descend 1 flight of stairs reciprocally (03/25/17)   Status New               Plan - 02/11/17 1751    Clinical Impression Statement Patient is a 51 y/o female presenting to [facility] today for low complexity eval s/p elective L TKA on 01/21/17. Patient today ambulating with RW with poor gait mechaincs, reduced AROM and PROM of L knee, reduced quad activation, and visibke extensor lag with SLR. Patient very fearful of lack of ROM at this point. Patient to benefit from PT to address the above listed deficits to allow for improved gait and overall function.    Rehab Potential Good   PT Frequency 3x / week  hopeful to taper to 2x/week   PT Duration 6 weeks   PT Treatment/Interventions ADLs/Self Care Home Management;Cryotherapy;Electrical Stimulation;Moist Heat;Ultrasound;Neuromuscular re-education;Balance training;Therapeutic  exercise;Therapeutic activities;Functional mobility training;Stair training;Gait training;Patient/family education;Manual techniques;Passive range of motion;Vasopneumatic Device;Taping   Consulted and Agree with Plan of Care Patient      Patient will benefit from skilled therapeutic intervention in order to improve the following deficits and impairments:  Abnormal gait, Decreased activity tolerance, Decreased balance, Decreased range of motion, Decreased mobility, Decreased strength, Difficulty walking, Increased edema, Pain  Visit Diagnosis: Acute pain of left knee - Plan: PT plan of care cert/re-cert  Stiffness of left knee, not elsewhere classified - Plan: PT plan of care cert/re-cert  Difficulty in walking, not elsewhere classified - Plan: PT plan of care cert/re-cert  Other abnormalities of gait and mobility - Plan: PT plan of care cert/re-cert  Muscle weakness (generalized) - Plan: PT plan of care cert/re-cert     Problem List Patient Active Problem List   Diagnosis Date Noted  . Unilateral primary osteoarthritis, left knee 01/21/2017  . S/P total knee replacement using cement, left 01/21/2017  . Chondromalacia patellae, left knee 01/02/2017  . Torn meniscus 06/29/2015  . Osteochondral defect of femoral condyle 06/29/2015  . Plantar fasciitis of right foot 09/27/2014      Lanney Gins, PT, DPT 02/12/17 8:28 AM   [facility] [address]  [place], [place], [address] Phone: [phone]   Fax:  [phone]  Name: Markeysha Erskine MRN: [id number] Date of Birth: 1966-04-05

## 2017-02-13 ENCOUNTER — Ambulatory Visit: Payer: BLUE CROSS/BLUE SHIELD

## 2017-02-13 ENCOUNTER — Telehealth (INDEPENDENT_AMBULATORY_CARE_PROVIDER_SITE_OTHER): Payer: Self-pay | Admitting: Orthopaedic Surgery

## 2017-02-13 ENCOUNTER — Other Ambulatory Visit (INDEPENDENT_AMBULATORY_CARE_PROVIDER_SITE_OTHER): Payer: Self-pay | Admitting: Orthopedic Surgery

## 2017-02-13 DIAGNOSIS — R262 Difficulty in walking, not elsewhere classified: Secondary | ICD-10-CM

## 2017-02-13 DIAGNOSIS — M25562 Pain in left knee: Secondary | ICD-10-CM | POA: Diagnosis not present

## 2017-02-13 DIAGNOSIS — R2689 Other abnormalities of gait and mobility: Secondary | ICD-10-CM

## 2017-02-13 DIAGNOSIS — M6281 Muscle weakness (generalized): Secondary | ICD-10-CM

## 2017-02-13 DIAGNOSIS — M25662 Stiffness of left knee, not elsewhere classified: Secondary | ICD-10-CM

## 2017-02-13 MED ORDER — HYDROCODONE-ACETAMINOPHEN 5-325 MG PO TABS: 1.0000 | ORAL_TABLET | Freq: Four times a day (QID) | ORAL | 0 refills | Status: DC | PRN

## 2017-02-13 NOTE — Telephone Encounter (Signed)
Please advise 

## 2017-02-13 NOTE — Therapy (Signed)
[facility] [address]  [place], [place], [address] Phone: [phone]   Fax:  [phone]  Physical Therapy Treatment  Patient Details  Name: [name] [name] MRN: [id number] Date of Birth: 08-15-1966 Referring Provider: Dr. Durward Fortes  Encounter Date: 02/13/2017      PT End of Session - 02/13/17 0940    Visit Number 2   Number of Visits 14   Date for PT Re-Evaluation 03/25/17   PT Start Time 0930   PT Stop Time 1042  increased time taken with pt. due to open time slot following;    PT Time Calculation (min) 72 min   Activity Tolerance Patient tolerated treatment well   Behavior During Therapy [facility] for tasks assessed/performed      Past Medical History:  Diagnosis Date  . Anxiety   . Arthritis   . Bell's palsy 2016   left side   . Cancer ([place])    skin cancer- left temporal " sitting on bone"  . Depression   . Hypercholesteremia   . PONV (postoperative nausea and vomiting)    would like scope patch    Past Surgical History:  Procedure Laterality Date  . CHONDROPLASTY Left 06/29/2015   Procedure: CHONDROPLASTY;  Surgeon: Garald Balding, MD;  Location: [place];  Service: Orthopedics;  Laterality: Left;  . FOOT SURGERY Bilateral    Bone Spurs  . KNEE ARTHROSCOPY WITH DRILLING/MICROFRACTURE Left 06/29/2015   Procedure: KNEE ARTHROSCOPY WITH DRILLING/MICROFRACTURE;  Surgeon: Garald Balding, MD;  Location: [place];  Service: Orthopedics;  Laterality: Left;  . KNEE ARTHROSCOPY WITH LATERAL MENISECTOMY Left 06/29/2015   Procedure: KNEE ARTHROSCOPY WITH LATERAL MENISECTOMY;  Surgeon: Garald Balding, MD;  Location: [place];  Service: Orthopedics;  Laterality: Left;  . KNEE ARTHROSCOPY WITH LATERAL RELEASE Left 06/29/2015   Procedure: KNEE ARTHROSCOPY WITH LATERAL RELEASE;  Surgeon: Garald Balding, MD;  Location: [place];  Service:  Orthopedics;  Laterality: Left;  . KNEE SURGERY    . Mohls Left    face  . PLANTAR FASCIA RELEASE Right 09/27/2014   Procedure: PLANTAR FASCIA RELEASE;  Surgeon: Garald Balding, MD;  Location: [place];  Service: Orthopedics;  Laterality: Right;  . TONSILLECTOMY    . TOTAL KNEE ARTHROPLASTY Left 01/21/2017   Procedure: TOTAL KNEE ARTHROPLASTY;  Surgeon: Garald Balding, MD;  Location: [place];  Service: Orthopedics;  Laterality: Left;  . Tummy tuck     2010    There were no vitals filed for this visit.      Subjective Assessment - 02/13/17 0933    Subjective Pt. reporting she is still having very high pain levels.     Currently in Pain? Yes   Pain Score 8    Pain Location Knee   Pain Orientation Left   Pain Descriptors / Indicators Constant   Pain Type Surgical pain   Multiple Pain Sites No            [facility] Adult PT Treatment/Exercise - 02/13/17 0948      Self-Care   Self-Care Other Self-Care Comments   Other Self-Care Comments  Pt. requiring instruction for rationale behind performance of heel slide HEP activity to improve flexion ROM; Discussion of L knee status with explanation of normality of high pain levels with surgery to reduce pt. anxiety; rationale required by pt. for all activities in treatment today due to limited compliance without  rationale      Knee/Hip Exercises: Stretches   Passive Hamstring Stretch Left;1 rep;30 seconds   Passive Hamstring Stretch Limitations with strap assist    Gastroc Stretch Left;1 rep;30 seconds   Gastroc Stretch Limitations with strap assist    Other Knee/Hip Stretches L ITB stretch with therapist x 30 sec    Other Knee/Hip Stretches L ITB stretch with strap x 30 sec      Knee/Hip Exercises: Aerobic   Nustep NuStep: level 2, 6 min   Seat forward to encourage flexion     Knee/Hip Exercises: Seated   Hamstring Curl AROM;Left;1 set;10 reps   Hamstring Limitations R LE assist     Knee/Hip Exercises: Supine    Quad Sets Left;10 reps   Quad Sets Limitations 5" hold    Short Arc Quad Sets Left;10 reps   Short Arc Quad Sets Limitations 5" hold at top    Heel Slides Left;15 reps   Heel Slides Limitations assist from therapist for first 5 reps    Straight Leg Raises Left;15 reps   Knee Flexion AAROM;Left;1 set;10 reps   Knee Flexion Limitations with strap assist    Other Supine Knee/Hip Exercises L HS curl with heels on peanut p-ball x 15 reps; strap assist; therapist overpressure 5" hold     Vasopneumatic   Number Minutes Vasopneumatic  10 minutes   Vasopnuematic Location  Knee   Vasopneumatic Pressure Medium   Vasopneumatic Temperature  coldest temp     Manual Therapy   Manual Therapy Passive ROM   Passive ROM L knee flexion/extension gentle stretching            PT Short Term Goals - 02/13/17 0941      PT SHORT TERM GOAL #1   Title pt to be independent with HEP (03/05/17)   Status On-going     PT SHORT TERM GOAL #2   Title pt to improve L knee ROM to 0-105 (03/05/17)   Status On-going           PT Long Term Goals - 02/13/17 0941      PT LONG TERM GOAL #1   Title patient to be independent with advanced HEP (03/25/17)   Status On-going     PT LONG TERM GOAL #2   Title Patient to improve L knee ROM to 0-120 (03/25/17)   Status On-going     PT LONG TERM GOAL #3   Title patient to demonstrate appropriate gait mechanics with good heel toe gait pattern with no AD (03/25/17)   Status On-going     PT LONG TERM GOAL #4   Title patient to demonstrate no extensor lag with SLR (03/25/17)   Status On-going     PT LONG TERM GOAL #5   Title patient to demonstrate ability to ascend/descend 1 flight of stairs reciprocally (03/25/17)   Status On-going               Plan - 02/13/17 0941    Clinical Impression Statement Pt. seen initially with high pain rating of 8/10.  Pt. requiring increased time and rest breaks for all activities due to high pain levels. Pt. reporting HEP adherence  yesterday with exception of heel slide activity.  Pt. requesting rationale for all therex activities today in treatment.  HEP review requiring increased time due to noncompliance with therapist requests.  Pt. expressing anxiety regarding status of knee ROM requiring therapist explanation and reassurance to normality of pain levels with HEP activities.  Pt.  able to perform all HEP activities in treatment today with normal pain level increases reported.  Focus of today's visit was flexion stretching and ROM activities.  Ice/compression to end treatment today with pt. reporting significant decrease in knee pain following this.  Pt. will continue to benefit from further skilled therapy to improve ROM, strength, and functional return.     PT Treatment/Interventions ADLs/Self Care Home Management;Cryotherapy;Electrical Stimulation;Moist Heat;Ultrasound;Neuromuscular re-education;Balance training;Therapeutic exercise;Therapeutic activities;Functional mobility training;Stair training;Gait training;Patient/family education;Manual techniques;Passive range of motion;Vasopneumatic Device;Taping   PT Next Visit Plan Continue focus on flexion ROM; Monitor adherence to HEP      Patient will benefit from skilled therapeutic intervention in order to improve the following deficits and impairments:  Abnormal gait, Decreased activity tolerance, Decreased balance, Decreased range of motion, Decreased mobility, Decreased strength, Difficulty walking, Increased edema, Pain  Visit Diagnosis: Acute pain of left knee  Stiffness of left knee, not elsewhere classified  Difficulty in walking, not elsewhere classified  Other abnormalities of gait and mobility  Muscle weakness (generalized)     Problem List Patient Active Problem List   Diagnosis Date Noted  . Unilateral primary osteoarthritis, left knee 01/21/2017  . S/P total knee replacement using cement, left 01/21/2017  . Chondromalacia patellae, left knee  01/02/2017  . Torn meniscus 06/29/2015  . Osteochondral defect of femoral condyle 06/29/2015  . Plantar fasciitis of right foot 09/27/2014    Bess Harvest, PTA 02/13/17 12:12 PM   [facility] [address]  [place], [place], [address] Phone: [phone]   Fax:  [phone]  Name: [name] [name] MRN: [id number] Date of Birth: 12/01/1966

## 2017-02-13 NOTE — Telephone Encounter (Signed)
Patient is requesting refill of pain medication. Patient is requesting this asap please.

## 2017-02-14 ENCOUNTER — Ambulatory Visit: Payer: BLUE CROSS/BLUE SHIELD | Admitting: Physical Therapy

## 2017-02-14 DIAGNOSIS — R262 Difficulty in walking, not elsewhere classified: Secondary | ICD-10-CM

## 2017-02-14 DIAGNOSIS — R2689 Other abnormalities of gait and mobility: Secondary | ICD-10-CM

## 2017-02-14 DIAGNOSIS — M25562 Pain in left knee: Secondary | ICD-10-CM

## 2017-02-14 DIAGNOSIS — M25662 Stiffness of left knee, not elsewhere classified: Secondary | ICD-10-CM

## 2017-02-14 NOTE — Therapy (Signed)
[facility] [address]  [place], [place], [address] Phone: [phone]   Fax:  [phone]  Physical Therapy Treatment  Patient Details  Name: [name] [name] MRN: [id number] Date of Birth: 10-21-1966 Referring Provider: Dr. Durward Fortes  Encounter Date: 02/14/2017      PT End of Session - 02/14/17 1217    Visit Number 3   Number of Visits 14   Date for PT Re-Evaluation 03/25/17   PT Start Time 1020   PT Stop Time 1123   PT Time Calculation (min) 63 min   Activity Tolerance Patient tolerated treatment well   Behavior During Therapy [facility] for tasks assessed/performed      Past Medical History:  Diagnosis Date  . Anxiety   . Arthritis   . Bell's palsy 2016   left side   . Cancer ([place])    skin cancer- left temporal " sitting on bone"  . Depression   . Hypercholesteremia   . PONV (postoperative nausea and vomiting)    would like scope patch    Past Surgical History:  Procedure Laterality Date  . CHONDROPLASTY Left 06/29/2015   Procedure: CHONDROPLASTY;  Surgeon: Garald Balding, MD;  Location: [facility];  Service: Orthopedics;  Laterality: Left;  . FOOT SURGERY Bilateral    Bone Spurs  . KNEE ARTHROSCOPY WITH DRILLING/MICROFRACTURE Left 06/29/2015   Procedure: KNEE ARTHROSCOPY WITH DRILLING/MICROFRACTURE;  Surgeon: Garald Balding, MD;  Location: [facility];  Service: Orthopedics;  Laterality: Left;  . KNEE ARTHROSCOPY WITH LATERAL MENISECTOMY Left 06/29/2015   Procedure: KNEE ARTHROSCOPY WITH LATERAL MENISECTOMY;  Surgeon: Garald Balding, MD;  Location: [facility];  Service: Orthopedics;  Laterality: Left;  . KNEE ARTHROSCOPY WITH LATERAL RELEASE Left 06/29/2015   Procedure: KNEE ARTHROSCOPY WITH LATERAL RELEASE;  Surgeon: Garald Balding, MD;  Location: [facility];  Service: Orthopedics;  Laterality: Left;  . KNEE SURGERY    . Mohls Left    face   . PLANTAR FASCIA RELEASE Right 09/27/2014   Procedure: PLANTAR FASCIA RELEASE;  Surgeon: Garald Balding, MD;  Location: [facility];  Service: Orthopedics;  Laterality: Right;  . TONSILLECTOMY    . TOTAL KNEE ARTHROPLASTY Left 01/21/2017   Procedure: TOTAL KNEE ARTHROPLASTY;  Surgeon: Garald Balding, MD;  Location: [facility];  Service: Orthopedics;  Laterality: Left;  . Tummy tuck     2010    There were no vitals filed for this visit.      Subjective Assessment - 02/14/17 1209    Subjective good compliance with HEP - Lateral knee continues to have pain   Currently in Pain? Yes   Pain Score 7    Pain Location Knee   Pain Orientation Left   Pain Descriptors / Indicators Tightness;Constant   Pain Type Surgical pain   Pain Frequency Constant            [facility] PT Assessment - 02/14/17 0001      AROM   Left Knee Flexion 99                     [facility] Adult PT Treatment/Exercise - 02/14/17 0001      Knee/Hip Exercises: Aerobic   Stationary Bike no resistance - full revolutions with ankle PF x 7 minutes     Knee/Hip Exercises: Seated   Long Arc Quad Left;15 reps   Heel Slides Left;AAROM;15 reps  Knee/Hip Exercises: Supine   Quad Sets Left;10 reps   Quad Sets Limitations 5 sec hold   Heel Slides Left;15 reps   Heel Slides Limitations foot on pillowcase   Straight Leg Raises Left;15 reps   Knee Flexion AAROM;Left;15 reps   Knee Flexion Limitations heels on peanut ball     Modalities   Modalities Vasopneumatic     Vasopneumatic   Number Minutes Vasopneumatic  15 minutes   Vasopnuematic Location  Knee   Vasopneumatic Pressure High   Vasopneumatic Temperature  coldest temp                  PT Short Term Goals - 02/13/17 0941      PT SHORT TERM GOAL #1   Title pt to be independent with HEP (03/05/17)   Status On-going     PT SHORT TERM GOAL #2   Title pt to improve L knee ROM to 0-105 (03/05/17)   Status On-going            PT Long Term Goals - 02/13/17 0941      PT LONG TERM GOAL #1   Title patient to be independent with advanced HEP (03/25/17)   Status On-going     PT LONG TERM GOAL #2   Title Patient to improve L knee ROM to 0-120 (03/25/17)   Status On-going     PT LONG TERM GOAL #3   Title patient to demonstrate appropriate gait mechanics with good heel toe gait pattern with no AD (03/25/17)   Status On-going     PT LONG TERM GOAL #4   Title patient to demonstrate no extensor lag with SLR (03/25/17)   Status On-going     PT LONG TERM GOAL #5   Title patient to demonstrate ability to ascend/descend 1 flight of stairs reciprocally (03/25/17)   Status On-going               Plan - 02/14/17 1218    Clinical Impression Statement Patient doing well today - continued pain of L knee. Able to make full revolutions on stationary bike today. Continued work on knee flexion with patient tolerable to all activities. Education with patient to be diligent with HEP this weekend to maintain and progress functional gains with good carryover.    PT Treatment/Interventions ADLs/Self Care Home Management;Cryotherapy;Electrical Stimulation;Moist Heat;Ultrasound;Neuromuscular re-education;Balance training;Therapeutic exercise;Therapeutic activities;Functional mobility training;Stair training;Gait training;Patient/family education;Manual techniques;Passive range of motion;Vasopneumatic Device;Taping   PT Next Visit Plan L knee ROM and strengthening   Consulted and Agree with Plan of Care Patient      Patient will benefit from skilled therapeutic intervention in order to improve the following deficits and impairments:  Abnormal gait, Decreased activity tolerance, Decreased balance, Decreased range of motion, Decreased mobility, Decreased strength, Difficulty walking, Increased edema, Pain  Visit Diagnosis: Acute pain of left knee  Stiffness of left knee, not elsewhere classified  Difficulty in walking, not  elsewhere classified  Other abnormalities of gait and mobility     Problem List Patient Active Problem List   Diagnosis Date Noted  . Unilateral primary osteoarthritis, left knee 01/21/2017  . S/P total knee replacement using cement, left 01/21/2017  . Chondromalacia patellae, left knee 01/02/2017  . Torn meniscus 06/29/2015  . Osteochondral defect of femoral condyle 06/29/2015  . Plantar fasciitis of right foot 09/27/2014     Lanney Gins, PT, DPT 02/14/17 12:20 PM   [facility] [place]  [place], [place], [address] Phone: [phone]   Fax:  [phone]  Name: Kelanie Pendelton MRN: [id number] Date of Birth: February 08, 1966

## 2017-02-17 ENCOUNTER — Ambulatory Visit: Payer: BLUE CROSS/BLUE SHIELD

## 2017-02-17 DIAGNOSIS — R262 Difficulty in walking, not elsewhere classified: Secondary | ICD-10-CM

## 2017-02-17 DIAGNOSIS — M25662 Stiffness of left knee, not elsewhere classified: Secondary | ICD-10-CM

## 2017-02-17 DIAGNOSIS — M25562 Pain in left knee: Secondary | ICD-10-CM | POA: Diagnosis not present

## 2017-02-17 DIAGNOSIS — R2689 Other abnormalities of gait and mobility: Secondary | ICD-10-CM

## 2017-02-17 NOTE — Therapy (Signed)
[facility] [address]  [place], [place], [address] Phone: [phone]   Fax:  [phone]  Physical Therapy Treatment  Patient Details  Name: [name] [name] MRN: [id number] Date of Birth: 10-03-1966 Referring Provider: Dr. Durward Fortes  Encounter Date: 02/17/2017      PT End of Session - 02/17/17 1017    Visit Number 4   Number of Visits 14   Date for PT Re-Evaluation 03/25/17   PT Start Time 1016   PT Stop Time 1113   PT Time Calculation (min) 57 min   Activity Tolerance Patient tolerated treatment well   Behavior During Therapy [facility] for tasks assessed/performed      Past Medical History:  Diagnosis Date  . Anxiety   . Arthritis   . Bell's palsy 2016   left side   . Cancer ([place])    skin cancer- left temporal " sitting on bone"  . Depression   . Hypercholesteremia   . PONV (postoperative nausea and vomiting)    would like scope patch    Past Surgical History:  Procedure Laterality Date  . CHONDROPLASTY Left 06/29/2015   Procedure: CHONDROPLASTY;  Surgeon: Garald Balding, MD;  Location: [facility];  Service: Orthopedics;  Laterality: Left;  . FOOT SURGERY Bilateral    Bone Spurs  . KNEE ARTHROSCOPY WITH DRILLING/MICROFRACTURE Left 06/29/2015   Procedure: KNEE ARTHROSCOPY WITH DRILLING/MICROFRACTURE;  Surgeon: Garald Balding, MD;  Location: [facility];  Service: Orthopedics;  Laterality: Left;  . KNEE ARTHROSCOPY WITH LATERAL MENISECTOMY Left 06/29/2015   Procedure: KNEE ARTHROSCOPY WITH LATERAL MENISECTOMY;  Surgeon: Garald Balding, MD;  Location: [facility];  Service: Orthopedics;  Laterality: Left;  . KNEE ARTHROSCOPY WITH LATERAL RELEASE Left 06/29/2015   Procedure: KNEE ARTHROSCOPY WITH LATERAL RELEASE;  Surgeon: Garald Balding, MD;  Location: [facility];  Service: Orthopedics;  Laterality: Left;  . KNEE SURGERY    . Mohls Left    face   . PLANTAR FASCIA RELEASE Right 09/27/2014   Procedure: PLANTAR FASCIA RELEASE;  Surgeon: Garald Balding, MD;  Location: [facility];  Service: Orthopedics;  Laterality: Right;  . TONSILLECTOMY    . TOTAL KNEE ARTHROPLASTY Left 01/21/2017   Procedure: TOTAL KNEE ARTHROPLASTY;  Surgeon: Garald Balding, MD;  Location: [facility];  Service: Orthopedics;  Laterality: Left;  . Tummy tuck     2010    There were no vitals filed for this visit.      Subjective Assessment - 02/17/17 1020    Subjective Pt. reporting good HEP compliance.  Pt. reporting, "band on the outside of my knee hurts today".    Currently in Pain? Yes   Pain Score 8    Pain Location Knee   Pain Orientation Left   Pain Descriptors / Indicators Tightness;Constant   Pain Type Surgical pain   Pain Frequency Constant   Multiple Pain Sites No           [facility] Adult PT Treatment/Exercise - 02/17/17 1025      Knee/Hip Exercises: Stretches   Passive Hamstring Stretch Left;1 rep;30 seconds   Passive Hamstring Stretch Limitations with strap assist    Gastroc Stretch Left;1 rep;30 seconds   Gastroc Stretch Limitations with therapist    Other Knee/Hip Stretches L ITB stretch with therapist x 30 sec      Knee/Hip Exercises: Seated   Long Arc Quad Left;15 reps  3"  hold at top    [employer] Limitations with adduction ball squeeze    Hamstring Curl AROM;Left;1 set;10 reps   Hamstring Limitations with blue TB     Knee/Hip Exercises: Supine   Quad Sets Left;10 reps   Quad Sets Limitations 5 sec hold; with therapist overpressure into extension   Heel Slides Left;15 reps   Heel Slides Limitations foot on pillowcase   Straight Leg Raises Left;15 reps   Knee Flexion AAROM;Left;15 reps   Knee Flexion Limitations heels on peanut ball; with strap; overpressure from therapist    Other Supine Knee/Hip Exercises SL bridge with heels on peanut p-ball x 15 reps      Vasopneumatic   Number Minutes Vasopneumatic  15  minutes   Vasopnuematic Location  Knee   Vasopneumatic Pressure Medium   Vasopneumatic Temperature  coldest temp     Manual Therapy   Manual Therapy Passive ROM   Passive ROM L knee flexion/extension gentle stretching             PT Short Term Goals - 02/13/17 0941      PT SHORT TERM GOAL #1   Title pt to be independent with HEP (03/05/17)   Status On-going     PT SHORT TERM GOAL #2   Title pt to improve L knee ROM to 0-105 (03/05/17)   Status On-going           PT Long Term Goals - 02/13/17 0941      PT LONG TERM GOAL #1   Title patient to be independent with advanced HEP (03/25/17)   Status On-going     PT LONG TERM GOAL #2   Title Patient to improve L knee ROM to 0-120 (03/25/17)   Status On-going     PT LONG TERM GOAL #3   Title patient to demonstrate appropriate gait mechanics with good heel toe gait pattern with no AD (03/25/17)   Status On-going     PT LONG TERM GOAL #4   Title patient to demonstrate no extensor lag with SLR (03/25/17)   Status On-going     PT LONG TERM GOAL #5   Title patient to demonstrate ability to ascend/descend 1 flight of stairs reciprocally (03/25/17)   Status On-going               Plan - 02/17/17 1024    Clinical Impression Statement Pt. with high pain ratings throughout therex today however able to perform all exercises.  Continued focus on flexion ROM today with progression of quad strengthening activities.  Pt. still visibly week with quad extension activities and reporting some soreness in L hip flexor area following last treatment which has improved over weekend.  Pt. will continue to benefit from further skilled therapy to improve ROM, strength, and functional capacity   PT Treatment/Interventions ADLs/Self Care Home Management;Cryotherapy;Electrical Stimulation;Moist Heat;Ultrasound;Neuromuscular re-education;Balance training;Therapeutic exercise;Therapeutic activities;Functional mobility training;Stair training;Gait  training;Patient/family education;Manual techniques;Passive range of motion;Vasopneumatic Device;Taping   PT Next Visit Plan L knee ROM and strengthening      Patient will benefit from skilled therapeutic intervention in order to improve the following deficits and impairments:  Abnormal gait, Decreased activity tolerance, Decreased balance, Decreased range of motion, Decreased mobility, Decreased strength, Difficulty walking, Increased edema, Pain  Visit Diagnosis: Acute pain of left knee  Stiffness of left knee, not elsewhere classified  Difficulty in walking, not elsewhere classified  Other abnormalities of gait and mobility     Problem List Patient Active Problem List  Diagnosis Date Noted  . Unilateral primary osteoarthritis, left knee 01/21/2017  . S/P total knee replacement using cement, left 01/21/2017  . Chondromalacia patellae, left knee 01/02/2017  . Torn meniscus 06/29/2015  . Osteochondral defect of femoral condyle 06/29/2015  . Plantar fasciitis of right foot 09/27/2014    Bess Harvest, PTA 02/17/17 3:34 PM  [facility] [address]  [place], [place], [address] Phone: [phone]   Fax:  [phone]  Name: [name] [name] MRN: [id number] Date of Birth: 04-Aug-1966

## 2017-02-19 ENCOUNTER — Ambulatory Visit: Payer: BLUE CROSS/BLUE SHIELD

## 2017-02-19 DIAGNOSIS — M25562 Pain in left knee: Secondary | ICD-10-CM

## 2017-02-19 DIAGNOSIS — R262 Difficulty in walking, not elsewhere classified: Secondary | ICD-10-CM

## 2017-02-19 DIAGNOSIS — M25662 Stiffness of left knee, not elsewhere classified: Secondary | ICD-10-CM

## 2017-02-19 DIAGNOSIS — R2689 Other abnormalities of gait and mobility: Secondary | ICD-10-CM

## 2017-02-19 NOTE — Therapy (Signed)
[facility] [address]  [place], [place], [address] Phone: [phone]   Fax:  [phone]  Physical Therapy Treatment  Patient Details  Name: [name] [name] MRN: [id number] Date of Birth: 1966/11/14 Referring Provider: Dr. Durward Fortes  Encounter Date: 02/19/2017      PT End of Session - 02/19/17 1020    Visit Number 5   Number of Visits 14   Date for PT Re-Evaluation 03/25/17   PT Start Time 1017   PT Stop Time 1115   PT Time Calculation (min) 58 min   Activity Tolerance Patient tolerated treatment well   Behavior During Therapy [facility] for tasks assessed/performed      Past Medical History:  Diagnosis Date  . Anxiety   . Arthritis   . Bell's palsy 2016   left side   . Cancer ([place])    skin cancer- left temporal " sitting on bone"  . Depression   . Hypercholesteremia   . PONV (postoperative nausea and vomiting)    would like scope patch    Past Surgical History:  Procedure Laterality Date  . CHONDROPLASTY Left 06/29/2015   Procedure: CHONDROPLASTY;  Surgeon: Garald Balding, MD;  Location: [facility];  Service: Orthopedics;  Laterality: Left;  . FOOT SURGERY Bilateral    Bone Spurs  . KNEE ARTHROSCOPY WITH DRILLING/MICROFRACTURE Left 06/29/2015   Procedure: KNEE ARTHROSCOPY WITH DRILLING/MICROFRACTURE;  Surgeon: Garald Balding, MD;  Location: [facility];  Service: Orthopedics;  Laterality: Left;  . KNEE ARTHROSCOPY WITH LATERAL MENISECTOMY Left 06/29/2015   Procedure: KNEE ARTHROSCOPY WITH LATERAL MENISECTOMY;  Surgeon: Garald Balding, MD;  Location: [facility];  Service: Orthopedics;  Laterality: Left;  . KNEE ARTHROSCOPY WITH LATERAL RELEASE Left 06/29/2015   Procedure: KNEE ARTHROSCOPY WITH LATERAL RELEASE;  Surgeon: Garald Balding, MD;  Location: [facility];  Service: Orthopedics;  Laterality: Left;  . KNEE SURGERY    . Mohls Left    face   . PLANTAR FASCIA RELEASE Right 09/27/2014   Procedure: PLANTAR FASCIA RELEASE;  Surgeon: Garald Balding, MD;  Location: [place];  Service: Orthopedics;  Laterality: Right;  . TONSILLECTOMY    . TOTAL KNEE ARTHROPLASTY Left 01/21/2017   Procedure: TOTAL KNEE ARTHROPLASTY;  Surgeon: Garald Balding, MD;  Location: [place];  Service: Orthopedics;  Laterality: Left;  . Tummy tuck     2010    There were no vitals filed for this visit.      Subjective Assessment - 02/19/17 1023    Subjective Pt. reporting R knee, "bursa sack irritated" today.  Pt. reporting she is performing HEP consistently    Currently in Pain? Yes   Pain Score 7    Pain Location Knee   Pain Orientation Left   Pain Descriptors / Indicators Tightness;Constant   Pain Type Surgical pain   Multiple Pain Sites No            [facility] Adult PT Treatment/Exercise - 02/19/17 1033      Knee/Hip Exercises: Stretches   Passive Hamstring Stretch Left;1 rep;30 seconds   Passive Hamstring Stretch Limitations with strap assist    Gastroc Stretch Left;1 rep;30 seconds   Gastroc Stretch Limitations with strap    Other Knee/Hip Stretches L ITB stretch with therapist x 30 sec      Knee/Hip Exercises: Aerobic   Nustep NuStep: level 3, 6 min      Knee/Hip  Exercises: Seated   Long Arc Quad Left;15 reps  3" hold at top   [employer] Limitations with adduction ball squeeze    Heel Slides Left;AAROM;15 reps;1 set  R LE assistance; 5" hold      Knee/Hip Exercises: Supine   Quad Sets Left;10 reps   Quad Sets Limitations 5" hold; with therapist overpressure into extension   Heel Slides --   Heel Slides Limitations --   Knee Flexion AAROM;Left;15 reps   Knee Flexion Limitations heels on peanut ball; with strap; overpressure from therapist    Other Supine Knee/Hip Exercises SL bridge with heels on peanut p-ball 3" x 15 reps   3" hold      Vasopneumatic   Number Minutes Vasopneumatic  15 minutes    Vasopnuematic Location  Knee   Vasopneumatic Pressure Medium   Vasopneumatic Temperature  coldest temp     Manual Therapy   Manual Therapy Passive ROM   Passive ROM L knee flexion/extension gentle stretching              PT Short Term Goals - 02/13/17 0941      PT SHORT TERM GOAL #1   Title pt to be independent with HEP (03/05/17)   Status On-going     PT SHORT TERM GOAL #2   Title pt to improve L knee ROM to 0-105 (03/05/17)   Status On-going           PT Long Term Goals - 02/13/17 0941      PT LONG TERM GOAL #1   Title patient to be independent with advanced HEP (03/25/17)   Status On-going     PT LONG TERM GOAL #2   Title Patient to improve L knee ROM to 0-120 (03/25/17)   Status On-going     PT LONG TERM GOAL #3   Title patient to demonstrate appropriate gait mechanics with good heel toe gait pattern with no AD (03/25/17)   Status On-going     PT LONG TERM GOAL #4   Title patient to demonstrate no extensor lag with SLR (03/25/17)   Status On-going     PT LONG TERM GOAL #5   Title patient to demonstrate ability to ascend/descend 1 flight of stairs reciprocally (03/25/17)   Status On-going               Plan - 02/19/17 1021    Clinical Impression Statement Pt. uncooperative in treatment today, poorly adhering to therapist instruction, and requiring increased time to perform all activities.  Pt. requiring rationale for all activities before performing today.  Pt. expressing anxiety regarding L knee status again today requiring thorough therapist instruction to inform her pain levels are typical.  Therex progression in treatment has been slow due to increased time required with activities and constant pt. complaint of high pain levels.  Pt. tolerable to all therex activities today.  Pt. will continue to benefit from further skilled therapy to improve LE strength, ROM, and return to function.     PT Treatment/Interventions ADLs/Self Care Home  Management;Cryotherapy;Electrical Stimulation;Moist Heat;Ultrasound;Neuromuscular re-education;Balance training;Therapeutic exercise;Therapeutic activities;Functional mobility training;Stair training;Gait training;Patient/family education;Manual techniques;Passive range of motion;Vasopneumatic Device;Taping   PT Next Visit Plan L knee ROM and strengthening      Patient will benefit from skilled therapeutic intervention in order to improve the following deficits and impairments:  Abnormal gait, Decreased activity tolerance, Decreased balance, Decreased range of motion, Decreased mobility, Decreased strength, Difficulty walking, Increased edema, Pain  Visit Diagnosis: Acute pain  of left knee  Stiffness of left knee, not elsewhere classified  Difficulty in walking, not elsewhere classified  Other abnormalities of gait and mobility     Problem List Patient Active Problem List   Diagnosis Date Noted  . Unilateral primary osteoarthritis, left knee 01/21/2017  . S/P total knee replacement using cement, left 01/21/2017  . Chondromalacia patellae, left knee 01/02/2017  . Torn meniscus 06/29/2015  . Osteochondral defect of femoral condyle 06/29/2015  . Plantar fasciitis of right foot 09/27/2014    Bess Harvest, PTA 02/19/17 1:06 PM  [facility] [address]  [place], [place], [address] Phone: [phone]   Fax:  [phone]  Name: [name] [name] MRN: [id number] Date of Birth: [birth year]

## 2017-02-21 ENCOUNTER — Ambulatory Visit: Payer: BLUE CROSS/BLUE SHIELD | Attending: Orthopaedic Surgery | Admitting: Physical Therapy

## 2017-02-21 DIAGNOSIS — M6281 Muscle weakness (generalized): Secondary | ICD-10-CM

## 2017-02-21 DIAGNOSIS — R262 Difficulty in walking, not elsewhere classified: Secondary | ICD-10-CM

## 2017-02-21 DIAGNOSIS — R2689 Other abnormalities of gait and mobility: Secondary | ICD-10-CM

## 2017-02-21 DIAGNOSIS — M25562 Pain in left knee: Secondary | ICD-10-CM | POA: Diagnosis not present

## 2017-02-21 DIAGNOSIS — M25662 Stiffness of left knee, not elsewhere classified: Secondary | ICD-10-CM | POA: Diagnosis present

## 2017-02-21 NOTE — Therapy (Signed)
[facility] [address]  [place], [place], [address] Phone: [phone]   Fax:  [phone]  Physical Therapy Treatment  Patient Details  Name: [name] [name] MRN: [id number] Date of Birth: 05-Sep-1966 Referring Provider: Dr. Durward Fortes  Encounter Date: 02/21/2017      PT End of Session - 02/21/17 1149    Visit Number 6   Number of Visits 14   Date for PT Re-Evaluation 03/25/17   PT Start Time 1016   PT Stop Time 1122   PT Time Calculation (min) 66 min   Activity Tolerance Patient tolerated treatment well   Behavior During Therapy [facility] for tasks assessed/performed      Past Medical History:  Diagnosis Date  . Anxiety   . Arthritis   . Bell's palsy 2016   left side   . Cancer ([place])    skin cancer- left temporal " sitting on bone"  . Depression   . Hypercholesteremia   . PONV (postoperative nausea and vomiting)    would like scope patch    Past Surgical History:  Procedure Laterality Date  . CHONDROPLASTY Left 06/29/2015   Procedure: CHONDROPLASTY;  Surgeon: Garald Balding, MD;  Location: [place];  Service: Orthopedics;  Laterality: Left;  . FOOT SURGERY Bilateral    Bone Spurs  . KNEE ARTHROSCOPY WITH DRILLING/MICROFRACTURE Left 06/29/2015   Procedure: KNEE ARTHROSCOPY WITH DRILLING/MICROFRACTURE;  Surgeon: Garald Balding, MD;  Location: [place];  Service: Orthopedics;  Laterality: Left;  . KNEE ARTHROSCOPY WITH LATERAL MENISECTOMY Left 06/29/2015   Procedure: KNEE ARTHROSCOPY WITH LATERAL MENISECTOMY;  Surgeon: Garald Balding, MD;  Location: [place];  Service: Orthopedics;  Laterality: Left;  . KNEE ARTHROSCOPY WITH LATERAL RELEASE Left 06/29/2015   Procedure: KNEE ARTHROSCOPY WITH LATERAL RELEASE;  Surgeon: Garald Balding, MD;  Location: [place];  Service: Orthopedics;  Laterality: Left;  . KNEE SURGERY    . Mohls Left    face   . PLANTAR FASCIA RELEASE Right 09/27/2014   Procedure: PLANTAR FASCIA RELEASE;  Surgeon: Garald Balding, MD;  Location: [place];  Service: Orthopedics;  Laterality: Right;  . TONSILLECTOMY    . TOTAL KNEE ARTHROPLASTY Left 01/21/2017   Procedure: TOTAL KNEE ARTHROPLASTY;  Surgeon: Garald Balding, MD;  Location: [place];  Service: Orthopedics;  Laterality: Left;  . Tummy tuck     2010    There were no vitals filed for this visit.      Subjective Assessment - 02/21/17 1140    Subjective Patient with continued reports of lateral knee pain   Currently in Pain? Yes   Pain Score 6    Pain Location Knee   Pain Orientation Left   Pain Descriptors / Indicators Tightness;Constant   Pain Type Surgical pain            [facility] PT Assessment - 02/21/17 0001      AROM   Left Knee Extension 1   Left Knee Flexion 95     PROM   Left Knee Extension 1   Left Knee Flexion 105                     [facility] Adult PT Treatment/Exercise - 02/21/17 0001      Ambulation/Gait   Ambulation/Gait Yes   Ambulation/Gait Assistance 5: Supervision   Ambulation Distance (Feet) 400 Feet   Assistive device Straight cane  Gait Pattern Step-through pattern;Decreased step length - right;Decreased stance time - left;Decreased hip/knee flexion - left;Decreased dorsiflexion - left;Decreased weight shift to left;Antalgic;Trunk flexed   Ambulation Surface Level;Indoor   Gait Comments much time spent on appropriate gait mechiancs with SPC; included use of mirror to reduce hip hike and to improve swing phase of gait.  pre-gait activities for carryover - step through exercise     Knee/Hip Exercises: Stretches   Sports administrator Left;3 reps;60 seconds   Quad Stretch Limitations prone with strap     Knee/Hip Exercises: Aerobic   Stationary Bike no resistance x 11 minutes (5 minutes before start of session)     Knee/Hip Exercises: Standing   Hip Flexion Left;15 reps;Knee bent   Other  Standing Knee Exercises toe clears 8" step focusing on reduced hip hike and forward flex - mirror     Knee/Hip Exercises: Supine   Heel Slides Left;AAROM;10 reps   Heel Slides Limitations foot on pillowcase     Modalities   Modalities Vasopneumatic     Vasopneumatic   Number Minutes Vasopneumatic  15 minutes   Vasopnuematic Location  Knee   Vasopneumatic Pressure High   Vasopneumatic Temperature  coldest temp                  PT Short Term Goals - 02/13/17 0941      PT SHORT TERM GOAL #1   Title pt to be independent with HEP (03/05/17)   Status On-going     PT SHORT TERM GOAL #2   Title pt to improve L knee ROM to 0-105 (03/05/17)   Status On-going           PT Long Term Goals - 02/13/17 0941      PT LONG TERM GOAL #1   Title patient to be independent with advanced HEP (03/25/17)   Status On-going     PT LONG TERM GOAL #2   Title Patient to improve L knee ROM to 0-120 (03/25/17)   Status On-going     PT LONG TERM GOAL #3   Title patient to demonstrate appropriate gait mechanics with good heel toe gait pattern with no AD (03/25/17)   Status On-going     PT LONG TERM GOAL #4   Title patient to demonstrate no extensor lag with SLR (03/25/17)   Status On-going     PT LONG TERM GOAL #5   Title patient to demonstrate ability to ascend/descend 1 flight of stairs reciprocally (03/25/17)   Status On-going               Plan - 02/21/17 1150    Clinical Impression Statement Much time spent today in PT session today on gait mechanicns with SPC as patient needs to wean from RW as she demonstrates reduced weight shifting and hip/knee flexion with RW, inhibiting progression for return to normal gait mechanics. Patient with tendency to hip hike as well as circumduct L LE during gait with poor/fair ability to improve hip/knee flexion, ankle DF as well as forward advancement of L LE when instructed. Time as spent on forward hip flexion with mirror to reinforce this  carryover into gait. Patient to continue to benefit from PT to maximize function.    PT Treatment/Interventions ADLs/Self Care Home Management;Cryotherapy;Electrical Stimulation;Moist Heat;Ultrasound;Neuromuscular re-education;Balance training;Therapeutic exercise;Therapeutic activities;Functional mobility training;Stair training;Gait training;Patient/family education;Manual techniques;Passive range of motion;Vasopneumatic Device;Taping   PT Next Visit Plan L knee ROM and strengthening   Consulted and Agree with Plan of Care Patient  Patient will benefit from skilled therapeutic intervention in order to improve the following deficits and impairments:  Abnormal gait, Decreased activity tolerance, Decreased balance, Decreased range of motion, Decreased mobility, Decreased strength, Difficulty walking, Increased edema, Pain  Visit Diagnosis: Acute pain of left knee  Stiffness of left knee, not elsewhere classified  Difficulty in walking, not elsewhere classified  Other abnormalities of gait and mobility  Muscle weakness (generalized)     Problem List Patient Active Problem List   Diagnosis Date Noted  . Unilateral primary osteoarthritis, left knee 01/21/2017  . S/P total knee replacement using cement, left 01/21/2017  . Chondromalacia patellae, left knee 01/02/2017  . Torn meniscus 06/29/2015  . Osteochondral defect of femoral condyle 06/29/2015  . Plantar fasciitis of right foot 09/27/2014    Lanney Gins, PT, DPT 02/21/17 11:56 AM   [facility] [address]  [place], [place], [address] Phone: [phone]   Fax:  [phone]  Name: Jovannah Hannegan MRN: [id number] Date of Birth: 21-Mar-1966

## 2017-02-24 ENCOUNTER — Encounter (INDEPENDENT_AMBULATORY_CARE_PROVIDER_SITE_OTHER): Payer: Self-pay | Admitting: Orthopaedic Surgery

## 2017-02-24 ENCOUNTER — Ambulatory Visit (INDEPENDENT_AMBULATORY_CARE_PROVIDER_SITE_OTHER): Payer: Self-pay

## 2017-02-24 ENCOUNTER — Ambulatory Visit (INDEPENDENT_AMBULATORY_CARE_PROVIDER_SITE_OTHER): Payer: BLUE CROSS/BLUE SHIELD | Admitting: Orthopaedic Surgery

## 2017-02-24 ENCOUNTER — Ambulatory Visit: Payer: BLUE CROSS/BLUE SHIELD

## 2017-02-24 VITALS — Resp 14 | Ht 64.0 in | Wt 170.0 lb

## 2017-02-24 DIAGNOSIS — M6281 Muscle weakness (generalized): Secondary | ICD-10-CM

## 2017-02-24 DIAGNOSIS — G8929 Other chronic pain: Secondary | ICD-10-CM

## 2017-02-24 DIAGNOSIS — M25561 Pain in right knee: Secondary | ICD-10-CM

## 2017-02-24 DIAGNOSIS — M25662 Stiffness of left knee, not elsewhere classified: Secondary | ICD-10-CM

## 2017-02-24 DIAGNOSIS — M25562 Pain in left knee: Secondary | ICD-10-CM

## 2017-02-24 DIAGNOSIS — R2689 Other abnormalities of gait and mobility: Secondary | ICD-10-CM

## 2017-02-24 DIAGNOSIS — R262 Difficulty in walking, not elsewhere classified: Secondary | ICD-10-CM

## 2017-02-24 MED ORDER — BUPIVACAINE HCL 0.5 % IJ SOLN
3.0000 mL | INTRAMUSCULAR | Status: AC | PRN
  Administered 2017-02-24: 3 mL via INTRA_ARTICULAR

## 2017-02-24 MED ORDER — OXYCODONE HCL 5 MG PO CAPS: 5.0000 mg | ORAL_CAPSULE | Freq: Three times a day (TID) | ORAL | 0 refills | Status: DC | PRN

## 2017-02-24 MED ORDER — METHYLPREDNISOLONE ACETATE 40 MG/ML IJ SUSP
80.0000 mg | INTRAMUSCULAR | Status: AC | PRN
  Administered 2017-02-24: 80 mg

## 2017-02-24 MED ORDER — LIDOCAINE HCL 1 % IJ SOLN
5.0000 mL | INTRAMUSCULAR | Status: AC | PRN
  Administered 2017-02-24: 5 mL

## 2017-02-24 NOTE — Therapy (Signed)
[facility] [address]  [place], [place], [address] Phone: [phone]   Fax:  [phone]  Physical Therapy Treatment  Patient Details  Name: [name] [name] MRN: [id number] Date of Birth: 1966/09/01 Referring Provider: Dr. Durward Fortes  Encounter Date: 02/24/2017      PT End of Session - 02/24/17 0930    Visit Number 7   Number of Visits 14   Date for PT Re-Evaluation 03/25/17   PT Start Time 0926   PT Stop Time 1029   PT Time Calculation (min) 63 min   Activity Tolerance Patient tolerated treatment well   Behavior During Therapy [facility] for tasks assessed/performed      Past Medical History:  Diagnosis Date  . Anxiety   . Arthritis   . Bell's palsy 2016   left side   . Cancer ([place])    skin cancer- left temporal " sitting on bone"  . Depression   . Hypercholesteremia   . PONV (postoperative nausea and vomiting)    would like scope patch    Past Surgical History:  Procedure Laterality Date  . CHONDROPLASTY Left 06/29/2015   Procedure: CHONDROPLASTY;  Surgeon: Garald Balding, MD;  Location: [place];  Service: Orthopedics;  Laterality: Left;  . FOOT SURGERY Bilateral    Bone Spurs  . KNEE ARTHROSCOPY WITH DRILLING/MICROFRACTURE Left 06/29/2015   Procedure: KNEE ARTHROSCOPY WITH DRILLING/MICROFRACTURE;  Surgeon: Garald Balding, MD;  Location: [place];  Service: Orthopedics;  Laterality: Left;  . KNEE ARTHROSCOPY WITH LATERAL MENISECTOMY Left 06/29/2015   Procedure: KNEE ARTHROSCOPY WITH LATERAL MENISECTOMY;  Surgeon: Garald Balding, MD;  Location: [place];  Service: Orthopedics;  Laterality: Left;  . KNEE ARTHROSCOPY WITH LATERAL RELEASE Left 06/29/2015   Procedure: KNEE ARTHROSCOPY WITH LATERAL RELEASE;  Surgeon: Garald Balding, MD;  Location: [place];  Service: Orthopedics;  Laterality: Left;  . KNEE SURGERY    . Mohls Left    face   . PLANTAR FASCIA RELEASE Right 09/27/2014   Procedure: PLANTAR FASCIA RELEASE;  Surgeon: Garald Balding, MD;  Location: [place];  Service: Orthopedics;  Laterality: Right;  . TONSILLECTOMY    . TOTAL KNEE ARTHROPLASTY Left 01/21/2017   Procedure: TOTAL KNEE ARTHROPLASTY;  Surgeon: Garald Balding, MD;  Location: [place];  Service: Orthopedics;  Laterality: Left;  . Tummy tuck     2010    There were no vitals filed for this visit.      Subjective Assessment - 02/24/17 0935    Subjective Pt. noting increased pain in medial knee today without known trigger.     Currently in Pain? Yes   Pain Score 8    Pain Location Knee   Pain Orientation Left   Pain Descriptors / Indicators Tightness;Constant   Pain Type Surgical pain   Pain Onset More than a month ago   Pain Frequency Constant   Aggravating Factors  physical therapy, stretching, mornings   Pain Relieving Factors Ice, pain meds   Multiple Pain Sites No           [facility] Adult PT Treatment/Exercise - 02/24/17 0942      Ambulation/Gait   Ambulation/Gait Yes   Ambulation/Gait Assistance 5: Supervision   Ambulation Distance (Feet) 270 Feet   Assistive device Straight cane   Gait Pattern Step-through pattern;Decreased step length - right;Decreased stance time - left;Decreased hip/knee flexion - left;Decreased dorsiflexion - left;Decreased  weight shift to left;Antalgic;Trunk flexed   Ambulation Surface Level;Indoor   Gait Comments Pt. demonstrating some carry over from last session with gait with SPC; min cues required for proper sequencing and heel strike; mod cues required for upright posture, even wt. shift, and increased hip/knee flexion with swing phase       Knee/Hip Exercises: Stretches   Quad Stretch Left;60 seconds;2 reps   Quad Stretch Limitations prone with strap     Knee/Hip Exercises: Aerobic   Stationary Bike no resistance x 10 minutes (5 minutes before start of session)     Knee/Hip  Exercises: Seated   Long Arc Quad Left;15 reps   Long Arc Quad Limitations 1#, 5" hold, with adduction ball squeeze      Knee/Hip Exercises: Supine   Knee Flexion AAROM;Left;15 reps  5" hold   Knee Flexion Limitations heels on peanut ball; with strap; overpressure from therapist    Other Supine Knee/Hip Exercises SL bridge with heels on peanut p-ball 3" x 15 reps      Vasopneumatic   Number Minutes Vasopneumatic  15 minutes   Vasopnuematic Location  Knee   Vasopneumatic Pressure Medium   Vasopneumatic Temperature  coldest temp            PT Short Term Goals - 02/24/17 1015      PT SHORT TERM GOAL #1   Title pt to be independent with HEP (03/05/17)   Status Achieved     PT SHORT TERM GOAL #2   Title pt to improve L knee ROM to 0-105 (03/05/17)   Status Partially Met  3.2.18: L knee PROM 1-105 dg, L knee AROM 1-95 dg            PT Long Term Goals - 02/13/17 0941      PT LONG TERM GOAL #1   Title patient to be independent with advanced HEP (03/25/17)   Status On-going     PT LONG TERM GOAL #2   Title Patient to improve L knee ROM to 0-120 (03/25/17)   Status On-going     PT LONG TERM GOAL #3   Title patient to demonstrate appropriate gait mechanics with good heel toe gait pattern with no AD (03/25/17)   Status On-going     PT LONG TERM GOAL #4   Title patient to demonstrate no extensor lag with SLR (03/25/17)   Status On-going     PT LONG TERM GOAL #5   Title patient to demonstrate ability to ascend/descend 1 flight of stairs reciprocally (03/25/17)   Status On-going               Plan - 02/24/17 0934    Clinical Impression Statement Pt. able to demo good carry over with gait training today requiring less cues with sequencing with SPC, and L heel strike.  Mod cues still required for upright posture and increased hip/knee flexion.  Pt. to receive SPC from [employer] later today and to see MD later today as well.  Pt. still reporting consistent HEP adherence however  demonstrating very limited quad control in treatment.  Increased time required with all activities in therapy due to pt. requiring frequent therapist explanation/rationale before performing.  L knee PROM extension measured at 1 dg today with 105 dg flexion measured on 3.2.18.  Pt. will continue to benefit from further skilled therapy to improve L LE strength, ROM, and return to function.     PT Treatment/Interventions ADLs/Self Care Home Management;Cryotherapy;Electrical Stimulation;Moist Heat;Ultrasound;Neuromuscular re-education;Balance training;Therapeutic exercise;Therapeutic   activities;Functional mobility training;Stair training;Gait training;Patient/family education;Manual techniques;Passive range of motion;Vasopneumatic Device;Taping   PT Next Visit Plan L knee ROM and strengthening      Patient will benefit from skilled therapeutic intervention in order to improve the following deficits and impairments:  Abnormal gait, Decreased activity tolerance, Decreased balance, Decreased range of motion, Decreased mobility, Decreased strength, Difficulty walking, Increased edema, Pain  Visit Diagnosis: Acute pain of left knee  Stiffness of left knee, not elsewhere classified  Difficulty in walking, not elsewhere classified  Other abnormalities of gait and mobility  Muscle weakness (generalized)     Problem List Patient Active Problem List   Diagnosis Date Noted  . Unilateral primary osteoarthritis, left knee 01/21/2017  . S/P total knee replacement using cement, left 01/21/2017  . Chondromalacia patellae, left knee 01/02/2017  . Torn meniscus 06/29/2015  . Osteochondral defect of femoral condyle 06/29/2015  . Plantar fasciitis of right foot 09/27/2014    Bess Harvest, PTA 02/24/17 12:19 PM  [facility] [address]  [place], [place], [address] Phone: [phone]   Fax:  [phone]  Name: [name] [name] MRN:  [id number] Date of Birth: March 08, 1966

## 2017-02-24 NOTE — Progress Notes (Signed)
Office Visit Note   Patient: [name] [name]           Date of Birth: [birth year]           MRN: [id number] Visit Date: 02/24/2017              Requested by: Frazier Butt, MD [place], [place] [address] PCP: Frazier Butt, MD   Assessment & Plan: Visit Diagnoses: 1 month status post primary left total knee replacement. Progressing in physical therapy with walker. Still having some pain.  Plan: Weightbearing as tolerated with walker and then to cane. Renewal OxyIR, inject right knee with cortisone  Follow-Up Instructions: No Follow-up on file.   Orders:  No orders of the defined types were placed in this encounter.  No orders of the defined types were placed in this encounter.     Procedures: Large Joint Inj Date/Time: 02/24/2017 2:21 PM Performed by: Garald Balding Authorized by: Garald Balding   Consent Given by:  Patient Timeout: prior to procedure the correct patient, procedure, and site was verified   Indications:  Pain and joint swelling Location:  Knee Site:  R knee Prep: patient was prepped and draped in usual sterile fashion   Needle Size:  25 G Needle Length:  1.5 inches Approach:  Anteromedial Ultrasound Guidance: No   Fluoroscopic Guidance: No   Arthrogram: No   Medications:  5 mL lidocaine 1 %; 80 mg methylPREDNISolone acetate 40 MG/ML; 3 mL bupivacaine 0.5 % Aspiration Attempted: No   Patient tolerance:  Patient tolerated the procedure well with no immediate complications     Clinical Data: No additional findings.   Subjective: No chief complaint on file.    [name] [name] is 4 weeks status post left Total knee replacement. Incision is clean, dry and intact. No related fever or chills or shortness of breath. No calf pain. Denies numbness or tingling. Pt relates a bursa sac behind right knee and very painful. Pt is ambulating with a walker     Review of Systems   Objective: Vital Signs: LMP 01/23/2015 Comment:  perimenopausal (very irregular), no chance pregnant  Physical Exam  Ortho Exam knee exam without effusion. Minimal swelling. Incision healing nicely without complication. No distal edema. No calf pain or popliteal mass. We can quadriceps as expected. Full extension and over 100 of flexion without instability. Her induration identified.  Specialty Comments:  No specialty comments available.  Imaging: No results found.   PMFS History: Patient Active Problem List   Diagnosis Date Noted  . Unilateral primary osteoarthritis, left knee 01/21/2017  . S/P total knee replacement using cement, left 01/21/2017  . Chondromalacia patellae, left knee 01/02/2017  . Torn meniscus 06/29/2015  . Osteochondral defect of femoral condyle 06/29/2015  . Plantar fasciitis of right foot 09/27/2014   Past Medical History:  Diagnosis Date  . Anxiety   . Arthritis   . Bell's palsy 2016   left side   . 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 Depression   . Hypercholesteremia   . PONV (postoperative nausea and vomiting)    would like scope patch    No family history on file.  Past Surgical History:  Procedure Laterality Date  . CHONDROPLASTY Left 06/29/2015   Procedure: CHONDROPLASTY;  Surgeon: Garald Balding, MD;  Location: [place];  Service: Orthopedics;  Laterality: Left;  . FOOT SURGERY Bilateral    Bone Spurs  .  KNEE ARTHROSCOPY WITH DRILLING/MICROFRACTURE Left 06/29/2015   Procedure: KNEE ARTHROSCOPY WITH DRILLING/MICROFRACTURE;  Surgeon: Garald Balding, MD;  Location: [place];  Service: Orthopedics;  Laterality: Left;  . KNEE ARTHROSCOPY WITH LATERAL MENISECTOMY Left 06/29/2015   Procedure: KNEE ARTHROSCOPY WITH LATERAL MENISECTOMY;  Surgeon: Garald Balding, MD;  Location: [place];  Service: Orthopedics;  Laterality: Left;  . KNEE ARTHROSCOPY WITH LATERAL RELEASE Left 06/29/2015   Procedure: KNEE ARTHROSCOPY  WITH LATERAL RELEASE;  Surgeon: Garald Balding, MD;  Location: [place];  Service: Orthopedics;  Laterality: Left;  . KNEE SURGERY    . Mohls Left    face  . PLANTAR FASCIA RELEASE Right 09/27/2014   Procedure: PLANTAR FASCIA RELEASE;  Surgeon: Garald Balding, MD;  Location: [place];  Service: Orthopedics;  Laterality: Right;  . TONSILLECTOMY    . TOTAL KNEE ARTHROPLASTY Left 01/21/2017   Procedure: TOTAL KNEE ARTHROPLASTY;  Surgeon: Garald Balding, MD;  Location: [place];  Service: Orthopedics;  Laterality: Left;  . Tummy tuck     2010   Social History   Occupational History  . Not on file.   Social History Main Topics  . Smoking status: Never Smoker  . Smokeless tobacco: Never Used  . Alcohol use No  . Drug use: No  . Sexual activity: Not Currently

## 2017-02-26 ENCOUNTER — Ambulatory Visit: Payer: BLUE CROSS/BLUE SHIELD | Admitting: Physical Therapy

## 2017-02-26 DIAGNOSIS — M6281 Muscle weakness (generalized): Secondary | ICD-10-CM

## 2017-02-26 DIAGNOSIS — R262 Difficulty in walking, not elsewhere classified: Secondary | ICD-10-CM

## 2017-02-26 DIAGNOSIS — M25662 Stiffness of left knee, not elsewhere classified: Secondary | ICD-10-CM

## 2017-02-26 DIAGNOSIS — M25562 Pain in left knee: Secondary | ICD-10-CM | POA: Diagnosis not present

## 2017-02-26 DIAGNOSIS — R2689 Other abnormalities of gait and mobility: Secondary | ICD-10-CM

## 2017-02-26 NOTE — Therapy (Signed)
[facility] [address]  [place], [place], [address] Phone: [phone]   Fax:  [phone]  Physical Therapy Treatment  Patient Details  Name: [name] [name] MRN: [id number] Date of Birth: 1966-05-21 Referring Provider: Dr. Durward Fortes  Encounter Date: 02/26/2017      PT End of Session - 02/26/17 0937    Visit Number 8   Number of Visits 14   Date for PT Re-Evaluation 03/25/17   PT Start Time 0931   PT Stop Time 1031   PT Time Calculation (min) 60 min   Activity Tolerance Patient tolerated treatment well   Behavior During Therapy [facility] for tasks assessed/performed      Past Medical History:  Diagnosis Date  . Anxiety   . Arthritis   . Bell's palsy 2016   left side   . Cancer ([place])    skin cancer- left temporal " sitting on bone"  . Depression   . Hypercholesteremia   . PONV (postoperative nausea and vomiting)    would like scope patch    Past Surgical History:  Procedure Laterality Date  . CHONDROPLASTY Left 06/29/2015   Procedure: CHONDROPLASTY;  Surgeon: Garald Balding, MD;  Location: [place];  Service: Orthopedics;  Laterality: Left;  . FOOT SURGERY Bilateral    Bone Spurs  . KNEE ARTHROSCOPY WITH DRILLING/MICROFRACTURE Left 06/29/2015   Procedure: KNEE ARTHROSCOPY WITH DRILLING/MICROFRACTURE;  Surgeon: Garald Balding, MD;  Location: [place];  Service: Orthopedics;  Laterality: Left;  . KNEE ARTHROSCOPY WITH LATERAL MENISECTOMY Left 06/29/2015   Procedure: KNEE ARTHROSCOPY WITH LATERAL MENISECTOMY;  Surgeon: Garald Balding, MD;  Location: [place];  Service: Orthopedics;  Laterality: Left;  . KNEE ARTHROSCOPY WITH LATERAL RELEASE Left 06/29/2015   Procedure: KNEE ARTHROSCOPY WITH LATERAL RELEASE;  Surgeon: Garald Balding, MD;  Location: [place];  Service: Orthopedics;  Laterality: Left;  . KNEE SURGERY    . Mohls Left    face   . PLANTAR FASCIA RELEASE Right 09/27/2014   Procedure: PLANTAR FASCIA RELEASE;  Surgeon: Garald Balding, MD;  Location: [place];  Service: Orthopedics;  Laterality: Right;  . TONSILLECTOMY    . TOTAL KNEE ARTHROPLASTY Left 01/21/2017   Procedure: TOTAL KNEE ARTHROPLASTY;  Surgeon: Garald Balding, MD;  Location: [place];  Service: Orthopedics;  Laterality: Left;  . Tummy tuck     2010    There were no vitals filed for this visit.      Subjective Assessment - 02/26/17 0936    Subjective Went to MD - good report, pleased with current progress. Had cortisone shot in R knee.    Currently in Pain? Yes   Pain Score 7    Pain Location Knee   Pain Orientation Left   Pain Descriptors / Indicators Aching;Tightness;Constant   Pain Type Surgical pain   Pain Onset More than a month ago   Pain Frequency Constant   Aggravating Factors  PT, stretching, mornings   Pain Relieving Factors ice, pain meds                         [facility] Adult PT Treatment/Exercise - 02/26/17 0938      Knee/Hip Exercises: Stretches   Quad Stretch Left;3 reps;60 seconds   Quad Stretch Limitations prone with strap   Gastroc Stretch Left;3 reps;30 seconds   Gastroc Stretch Limitations with blue rocker  Knee/Hip Exercises: Aerobic   Nustep level 5 x 6 minutes     Knee/Hip Exercises: Standing   Forward Step Up Left;20 reps;Hand Hold: 0;Step Height: 4"   Step Down Left;15 reps;Hand Hold: 1;Step Height: 4"   Step Down Limitations for eccentric quad control   Other Standing Knee Exercises toe clears 8" step focusing on reduced hip hike and forward flex - mirror -  x 20 reps     Knee/Hip Exercises: Seated   Long Arc Quad Strengthening;Left;20 reps;Weights   Long Arc Quad Weight 3 lbs.   Long [employer] Limitations 5" hold     Knee/Hip Exercises: Supine   Straight Leg Raises Left;20 reps     Modalities   Modalities Vasopneumatic     Vasopneumatic   Number Minutes  Vasopneumatic  15 minutes   Vasopnuematic Location  Knee   Vasopneumatic Pressure High   Vasopneumatic Temperature  coldest temp                  PT Short Term Goals - 02/24/17 1015      PT SHORT TERM GOAL #1   Title pt to be independent with HEP (03/05/17)   Status Achieved     PT SHORT TERM GOAL #2   Title pt to improve L knee ROM to 0-105 (03/05/17)   Status Partially Met  3.2.18: L knee PROM 1-105 dg, L knee AROM 1-95 dg            PT Long Term Goals - 02/13/17 0941      PT LONG TERM GOAL #1   Title patient to be independent with advanced HEP (03/25/17)   Status On-going     PT LONG TERM GOAL #2   Title Patient to improve L knee ROM to 0-120 (03/25/17)   Status On-going     PT LONG TERM GOAL #3   Title patient to demonstrate appropriate gait mechanics with good heel toe gait pattern with no AD (03/25/17)   Status On-going     PT LONG TERM GOAL #4   Title patient to demonstrate no extensor lag with SLR (03/25/17)   Status On-going     PT LONG TERM GOAL #5   Title patient to demonstrate ability to ascend/descend 1 flight of stairs reciprocally (03/25/17)   Status On-going               Plan - 02/26/17 3354    Clinical Impression Statement Patient doing well - saw MD this week with good report - continue gait trianing with SPC, improve strength, and ROM. Patient doing well with 4" step up today with no required UE support following initial trial of activity. Much more difficulty with 4" step down demonstrating poor eccentric quad strength. patient to continue to beneift from PT to maximize functional mobility.    PT Treatment/Interventions ADLs/Self Care Home Management;Cryotherapy;Electrical Stimulation;Moist Heat;Ultrasound;Neuromuscular re-education;Balance training;Therapeutic exercise;Therapeutic activities;Functional mobility training;Stair training;Gait training;Patient/family education;Manual techniques;Passive range of motion;Vasopneumatic Device;Taping    PT Next Visit Plan L knee ROM and strengthening   Consulted and Agree with Plan of Care Patient      Patient will benefit from skilled therapeutic intervention in order to improve the following deficits and impairments:  Abnormal gait, Decreased activity tolerance, Decreased balance, Decreased range of motion, Decreased mobility, Decreased strength, Difficulty walking, Increased edema, Pain  Visit Diagnosis: Acute pain of left knee  Stiffness of left knee, not elsewhere classified  Difficulty in walking, not elsewhere classified  Other abnormalities of gait  and mobility  Muscle weakness (generalized)     Problem List Patient Active Problem List   Diagnosis Date Noted  . Unilateral primary osteoarthritis, left knee 01/21/2017  . S/P total knee replacement using cement, left 01/21/2017  . Chondromalacia patellae, left knee 01/02/2017  . Torn meniscus 06/29/2015  . Osteochondral defect of femoral condyle 06/29/2015  . Plantar fasciitis of right foot 09/27/2014     Lanney Gins, PT, DPT 02/26/17 12:00 PM   [facility] [address]  [place], [place], [address] Phone: [phone]   Fax:  [phone]  Name: Natascha Edmonds MRN: [id number] Date of Birth: [birth year]

## 2017-02-28 ENCOUNTER — Ambulatory Visit: Payer: BLUE CROSS/BLUE SHIELD

## 2017-02-28 DIAGNOSIS — M25562 Pain in left knee: Secondary | ICD-10-CM | POA: Diagnosis not present

## 2017-02-28 DIAGNOSIS — R262 Difficulty in walking, not elsewhere classified: Secondary | ICD-10-CM

## 2017-02-28 DIAGNOSIS — R2689 Other abnormalities of gait and mobility: Secondary | ICD-10-CM

## 2017-02-28 DIAGNOSIS — M6281 Muscle weakness (generalized): Secondary | ICD-10-CM

## 2017-02-28 DIAGNOSIS — M25662 Stiffness of left knee, not elsewhere classified: Secondary | ICD-10-CM

## 2017-02-28 NOTE — Therapy (Signed)
[facility] [address]  [place], [place], [address] Phone: [phone]   Fax:  [phone]  Physical Therapy Treatment  Patient Details  Name: [name] [name] MRN: [id number] Date of Birth: 1966-01-30 Referring Provider: Dr. Durward Fortes  Encounter Date: 02/28/2017      PT End of Session - 02/28/17 0929    Visit Number 9   Number of Visits 14   Date for PT Re-Evaluation 03/25/17   PT Start Time 0930   PT Stop Time 1030   PT Time Calculation (min) 60 min   Activity Tolerance Patient tolerated treatment well   Behavior During Therapy [facility] for tasks assessed/performed      Past Medical History:  Diagnosis Date  . Anxiety   . Arthritis   . Bell's palsy 2016   left side   . Cancer ([place])    skin cancer- left temporal " sitting on bone"  . Depression   . Hypercholesteremia   . PONV (postoperative nausea and vomiting)    would like scope patch    Past Surgical History:  Procedure Laterality Date  . CHONDROPLASTY Left 06/29/2015   Procedure: CHONDROPLASTY;  Surgeon: Garald Balding, MD;  Location: [place];  Service: Orthopedics;  Laterality: Left;  . FOOT SURGERY Bilateral    Bone Spurs  . KNEE ARTHROSCOPY WITH DRILLING/MICROFRACTURE Left 06/29/2015   Procedure: KNEE ARTHROSCOPY WITH DRILLING/MICROFRACTURE;  Surgeon: Garald Balding, MD;  Location: [place];  Service: Orthopedics;  Laterality: Left;  . KNEE ARTHROSCOPY WITH LATERAL MENISECTOMY Left 06/29/2015   Procedure: KNEE ARTHROSCOPY WITH LATERAL MENISECTOMY;  Surgeon: Garald Balding, MD;  Location: [place];  Service: Orthopedics;  Laterality: Left;  . KNEE ARTHROSCOPY WITH LATERAL RELEASE Left 06/29/2015   Procedure: KNEE ARTHROSCOPY WITH LATERAL RELEASE;  Surgeon: Garald Balding, MD;  Location: [place];  Service: Orthopedics;  Laterality: Left;  . KNEE SURGERY    . Mohls Left    face   . PLANTAR FASCIA RELEASE Right 09/27/2014   Procedure: PLANTAR FASCIA RELEASE;  Surgeon: Garald Balding, MD;  Location: [place];  Service: Orthopedics;  Laterality: Right;  . TONSILLECTOMY    . TOTAL KNEE ARTHROPLASTY Left 01/21/2017   Procedure: TOTAL KNEE ARTHROPLASTY;  Surgeon: Garald Balding, MD;  Location: [place];  Service: Orthopedics;  Laterality: Left;  . Tummy tuck     2010    There were no vitals filed for this visit.      Subjective Assessment - 02/28/17 0933    Subjective Pt. reporting increased stiffness and pain today.     Currently in Pain? Yes   Pain Score 7    Pain Location Knee   Pain Orientation Left   Pain Descriptors / Indicators Aching;Tightness   Pain Type Surgical pain   Pain Onset More than a month ago   Pain Frequency Constant   Multiple Pain Sites No                         [facility] Adult PT Treatment/Exercise - 02/28/17 0949      Ambulation/Gait   Ambulation/Gait Yes   Ambulation/Gait Assistance 5: Supervision   Ambulation Distance (Feet) 180 Feet   Assistive device Straight cane   Gait Pattern Step-through pattern;Decreased step length - right;Decreased stance time - left;Decreased hip/knee flexion - left;Decreased dorsiflexion - left;Decreased weight shift to left;Antalgic;Trunk flexed  Ambulation Surface Level;Indoor   Gait Comments mod cues for upright posture and even stance time today with SPC     Knee/Hip Exercises: Stretches   Passenger transport manager Limitations prone with strap   Other Knee/Hip Stretches L ITB stretch with therapist x 30 sec      Knee/Hip Exercises: Aerobic   Stationary Bike no resistance x  6 min; full revolutions, seated moved up for ROM     Knee/Hip Exercises: Standing   Hip Flexion Left;15 reps;Knee bent  3" hold    Terminal Knee Extension Limitations Wall ball squeeze 5" x 15 reps    Forward Step Up Left;20 reps;Step Height: 4";Hand Hold: 1    Forward Step Up Limitations 1 UE support on counter   Step Down Left;Hand Hold: 1;Step Height: 4";20 reps   Step Down Limitations for eccentric quad control     Knee/Hip Exercises: Supine   Straight Leg Raises Left;20 reps;2 sets  with cues for quad set prior to movement      Vasopneumatic   Number Minutes Vasopneumatic  15 minutes   Vasopnuematic Location  Knee   Vasopneumatic Pressure High   Vasopneumatic Temperature  coldest temp     Manual Therapy   Manual Therapy Passive ROM;Joint mobilization   Joint Mobilization L patellar mobility all directions, superior/inferior for improved ROM   Passive ROM L knee flexion/extension gentle stretching                   PT Short Term Goals - 02/24/17 1015      PT SHORT TERM GOAL #1   Title pt to be independent with HEP (03/05/17)   Status Achieved     PT SHORT TERM GOAL #2   Title pt to improve L knee ROM to 0-105 (03/05/17)   Status Partially Met  3.2.18: L knee PROM 1-105 dg, L knee AROM 1-95 dg            PT Long Term Goals - 02/13/17 0941      PT LONG TERM GOAL #1   Title patient to be independent with advanced HEP (03/25/17)   Status On-going     PT LONG TERM GOAL #2   Title Patient to improve L knee ROM to 0-120 (03/25/17)   Status On-going     PT LONG TERM GOAL #3   Title patient to demonstrate appropriate gait mechanics with good heel toe gait pattern with no AD (03/25/17)   Status On-going     PT LONG TERM GOAL #4   Title patient to demonstrate no extensor lag with SLR (03/25/17)   Status On-going     PT LONG TERM GOAL #5   Title patient to demonstrate ability to ascend/descend 1 flight of stairs reciprocally (03/25/17)   Status On-going               Plan - 02/28/17 0935    Clinical Impression Statement Today's treatment focusing on stepping activities to improve quad control.  Pt. demonstrating improved quad control with SLR today however still with poor quad control with 4" step downs.  Pt.  required cueing throughout gait training today for heel strike and upright posture however able to self-correct with this.   Treatment ending with ice/compression to decrease post-exercise swelling and pain.  Pt. will continue to benefit from further skilled therapy to maximize LE strength, ROM, and function.     PT Treatment/Interventions ADLs/Self Care Home Management;Cryotherapy;Electrical Stimulation;Moist Heat;Ultrasound;Neuromuscular re-education;Balance  training;Therapeutic exercise;Therapeutic activities;Functional mobility training;Stair training;Gait training;Patient/family education;Manual techniques;Passive range of motion;Vasopneumatic Device;Taping   PT Next Visit Plan 10th visit; L knee ROM and strengthening      Patient will benefit from skilled therapeutic intervention in order to improve the following deficits and impairments:  Abnormal gait, Decreased activity tolerance, Decreased balance, Decreased range of motion, Decreased mobility, Decreased strength, Difficulty walking, Increased edema, Pain  Visit Diagnosis: Acute pain of left knee  Stiffness of left knee, not elsewhere classified  Difficulty in walking, not elsewhere classified  Other abnormalities of gait and mobility  Muscle weakness (generalized)     Problem List Patient Active Problem List   Diagnosis Date Noted  . Unilateral primary osteoarthritis, left knee 01/21/2017  . S/P total knee replacement using cement, left 01/21/2017  . Chondromalacia patellae, left knee 01/02/2017  . Torn meniscus 06/29/2015  . Osteochondral defect of femoral condyle 06/29/2015  . Plantar fasciitis of right foot 09/27/2014    Bess Harvest, PTA 02/28/17 12:59 PM  [facility] [address]  [place], [place], [address] Phone: [phone]   Fax:  [phone]  Name: [name] [name] MRN: [id number] Date of Birth: [birth year]

## 2017-03-03 ENCOUNTER — Ambulatory Visit: Payer: BLUE CROSS/BLUE SHIELD | Admitting: Physical Therapy

## 2017-03-03 DIAGNOSIS — M25562 Pain in left knee: Secondary | ICD-10-CM

## 2017-03-03 DIAGNOSIS — M6281 Muscle weakness (generalized): Secondary | ICD-10-CM

## 2017-03-03 DIAGNOSIS — M25662 Stiffness of left knee, not elsewhere classified: Secondary | ICD-10-CM

## 2017-03-03 DIAGNOSIS — R262 Difficulty in walking, not elsewhere classified: Secondary | ICD-10-CM

## 2017-03-03 DIAGNOSIS — R2689 Other abnormalities of gait and mobility: Secondary | ICD-10-CM

## 2017-03-03 NOTE — Therapy (Signed)
[facility] [address]  [place], [place], [address] Phone: [phone]   Fax:  [phone]  Physical Therapy Treatment  Patient Details  Name: [name] [name] [name] MRN: [id number] Date of Birth: 28-Dec-1965 Referring Provider: Dr. Durward Fortes  Encounter Date: 03/03/2017      PT End of Session - 03/03/17 0939    Visit Number 10   Number of Visits 14   Date for PT Re-Evaluation 03/25/17   PT Start Time 0932   PT Stop Time 1033   PT Time Calculation (min) 61 min   Activity Tolerance Patient tolerated treatment well   Behavior During Therapy [facility] for tasks assessed/performed      Past Medical History:  Diagnosis Date  . Anxiety   . Arthritis   . Bell's palsy 2016   left side   . 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 Depression   . Hypercholesteremia   . PONV (postoperative nausea and vomiting)    would like scope patch    Past Surgical History:  Procedure Laterality Date  . CHONDROPLASTY Left 06/29/2015   Procedure: CHONDROPLASTY;  Surgeon: Garald Balding, MD;  Location: [place];  Service: Orthopedics;  Laterality: Left;  . FOOT SURGERY Bilateral    Bone Spurs  . KNEE ARTHROSCOPY WITH DRILLING/MICROFRACTURE Left 06/29/2015   Procedure: KNEE ARTHROSCOPY WITH DRILLING/MICROFRACTURE;  Surgeon: Garald Balding, MD;  Location: [place];  Service: Orthopedics;  Laterality: Left;  . KNEE ARTHROSCOPY WITH LATERAL MENISECTOMY Left 06/29/2015   Procedure: KNEE ARTHROSCOPY WITH LATERAL MENISECTOMY;  Surgeon: Garald Balding, MD;  Location: [place];  Service: Orthopedics;  Laterality: Left;  . KNEE ARTHROSCOPY WITH LATERAL RELEASE Left 06/29/2015   Procedure: KNEE ARTHROSCOPY WITH LATERAL RELEASE;  Surgeon: Garald Balding, MD;  Location: [place];  Service: Orthopedics;  Laterality: Left;  . KNEE SURGERY    . Mohls Left    face   . PLANTAR FASCIA RELEASE Right 09/27/2014   Procedure: PLANTAR FASCIA RELEASE;  Surgeon: Garald Balding, MD;  Location: [place];  Service: Orthopedics;  Laterality: Right;  . TONSILLECTOMY    . TOTAL KNEE ARTHROPLASTY Left 01/21/2017   Procedure: TOTAL KNEE ARTHROPLASTY;  Surgeon: Garald Balding, MD;  Location: [place];  Service: Orthopedics;  Laterality: Left;  . Tummy tuck     2010    There were no vitals filed for this visit.      Subjective Assessment - 03/03/17 0937    Subjective Feels like weather is really effecting her today   Currently in Pain? Yes   Pain Score 7    Pain Location Knee   Pain Orientation Left   Pain Descriptors / Indicators Aching;Tightness   Pain Type Surgical pain   Pain Onset More than a month ago   Pain Frequency Constant                         [facility] Adult PT Treatment/Exercise - 03/03/17 0001      Knee/Hip Exercises: Aerobic   Stationary Bike L1 x 3 min; L2 x 3 min     Knee/Hip Exercises: Machines for Strengthening   Cybex Knee Extension 10# B LE x 5 reps; 10# L LE eccentric with PT assist x 12 reps   Cybex Leg Press 20# B LE x 15 reps  Knee/Hip Exercises: Standing   Forward Step Up Left;15 reps;Hand Hold: 1;Step Height: 6"   Forward Step Up Limitations SPC   Other Standing Knee Exercises step overs - 1 AirEx x 15 reps - SPC used     Knee/Hip Exercises: Seated   Long Arc Quad Strengthening;Left;15 reps;Weights   Long Arc Quad Weight 3 lbs.   Long [employer] Limitations 5" hold   Hamstring Curl Strengthening;Left;10 reps   Hamstring Limitations green tband     Modalities   Modalities Vasopneumatic     Vasopneumatic   Number Minutes Vasopneumatic  15 minutes   Vasopnuematic Location  Knee   Vasopneumatic Pressure High   Vasopneumatic Temperature  coldest temp                  PT Short Term Goals - 02/24/17 1015      PT SHORT TERM GOAL #1   Title pt to be independent with HEP  (03/05/17)   Status Achieved     PT SHORT TERM GOAL #2   Title pt to improve L knee ROM to 0-105 (03/05/17)   Status Partially Met  3.2.18: L knee PROM 1-105 dg, L knee AROM 1-95 dg            PT Long Term Goals - 02/13/17 0941      PT LONG TERM GOAL #1   Title patient to be independent with advanced HEP (03/25/17)   Status On-going     PT LONG TERM GOAL #2   Title Patient to improve L knee ROM to 0-120 (03/25/17)   Status On-going     PT LONG TERM GOAL #3   Title patient to demonstrate appropriate gait mechanics with good heel toe gait pattern with no AD (03/25/17)   Status On-going     PT LONG TERM GOAL #4   Title patient to demonstrate no extensor lag with SLR (03/25/17)   Status On-going     PT LONG TERM GOAL #5   Title patient to demonstrate ability to ascend/descend 1 flight of stairs reciprocally (03/25/17)   Status On-going               Plan - 03/03/17 0939    Clinical Impression Statement [name] today with complaints of increased knee pain and stiffness - of which she attributes to weather. Continued work with quad strengthening with reduced carryover related to overall instruction - requires consistent VC and education to perform each task. Much difficulty with HS curls with theraband today.   PT Treatment/Interventions ADLs/Self Care Home Management;Cryotherapy;Electrical Stimulation;Moist Heat;Ultrasound;Neuromuscular re-education;Balance training;Therapeutic exercise;Therapeutic activities;Functional mobility training;Stair training;Gait training;Patient/family education;Manual techniques;Passive range of motion;Vasopneumatic Device;Taping   PT Next Visit Plan L knee ROM and strengthening   Consulted and Agree with Plan of Care Patient      Patient will benefit from skilled therapeutic intervention in order to improve the following deficits and impairments:  Abnormal gait, Decreased activity tolerance, Decreased balance, Decreased range of motion, Decreased  mobility, Decreased strength, Difficulty walking, Increased edema, Pain  Visit Diagnosis: Acute pain of left knee  Stiffness of left knee, not elsewhere classified  Difficulty in walking, not elsewhere classified  Other abnormalities of gait and mobility  Muscle weakness (generalized)     Problem List Patient Active Problem List   Diagnosis Date Noted  . Unilateral primary osteoarthritis, left knee 01/21/2017  . S/P total knee replacement using cement, left 01/21/2017  . Chondromalacia patellae, left knee 01/02/2017  . Torn meniscus 06/29/2015  .  Osteochondral defect of femoral condyle 06/29/2015  . Plantar fasciitis of right foot 09/27/2014     [name] [name] [name], PT, DPT 03/03/17 12:41 PM   [facility] [address]  [place], [place], [address] Phone: [phone]   Fax:  [phone]  Name: [name] [name] [name] MRN: [id number] Date of Birth: November 02, 1966

## 2017-03-05 ENCOUNTER — Ambulatory Visit: Payer: BLUE CROSS/BLUE SHIELD | Admitting: Physical Therapy

## 2017-03-05 DIAGNOSIS — M6281 Muscle weakness (generalized): Secondary | ICD-10-CM

## 2017-03-05 DIAGNOSIS — M25662 Stiffness of left knee, not elsewhere classified: Secondary | ICD-10-CM

## 2017-03-05 DIAGNOSIS — R262 Difficulty in walking, not elsewhere classified: Secondary | ICD-10-CM

## 2017-03-05 DIAGNOSIS — M25562 Pain in left knee: Secondary | ICD-10-CM | POA: Diagnosis not present

## 2017-03-05 DIAGNOSIS — R2689 Other abnormalities of gait and mobility: Secondary | ICD-10-CM

## 2017-03-05 IMAGING — MR MR KNEE*L* W/O CM
4 of 5 series · 19 of 40 positions shown · non-contrast
Comparison: None.

CLINICAL DATA: Chronic left knee pain and limited range of motion.
History of surgery in 4998. Question chondromalacia.

EXAM:
MRI OF THE LEFT KNEE WITHOUT CONTRAST
TECHNIQUE: Multiplanar, multisequence MR imaging of the knee was performed. No
intravenous contrast was administered.

[Series 3: PD fat-sat · axial · 3.5mm · 0.31mm/px · z∈[-29,+72]mm · 8 of 25 slices shown (1 of 3)]
[im 1/25]
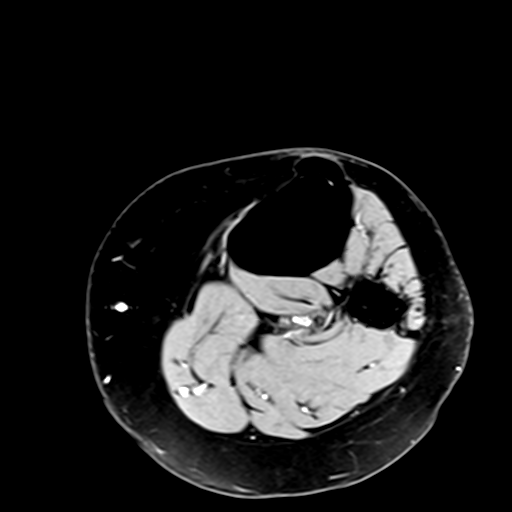
[im 4/25]
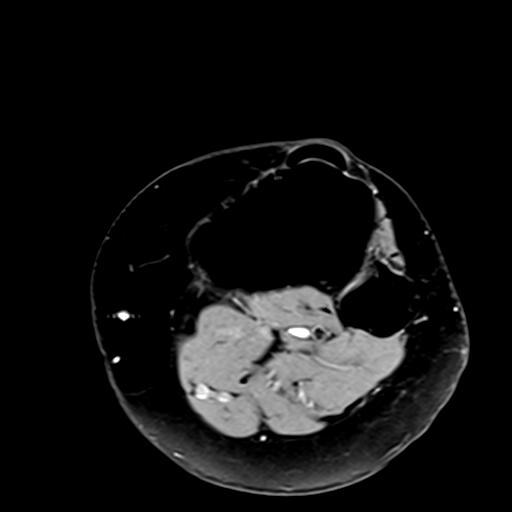
[im 7/25]
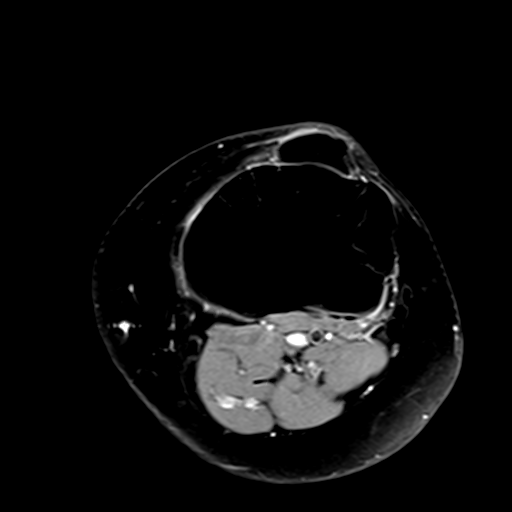
[im 11/25]
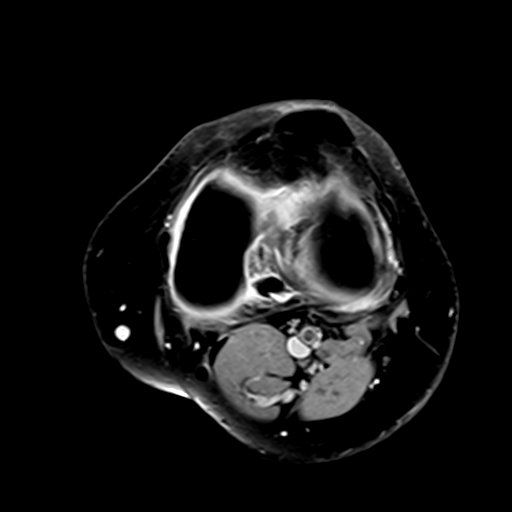
[im 14/25]
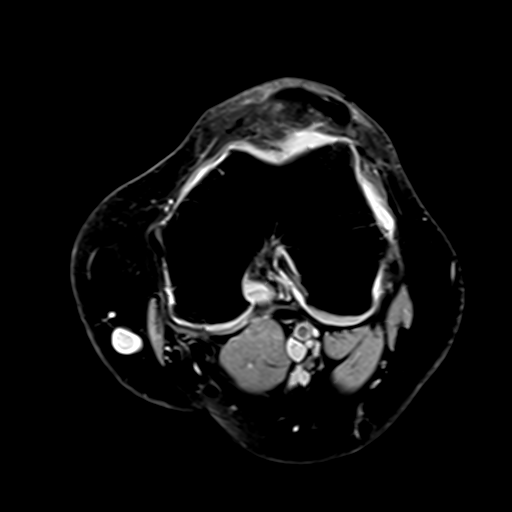
[im 18/25]
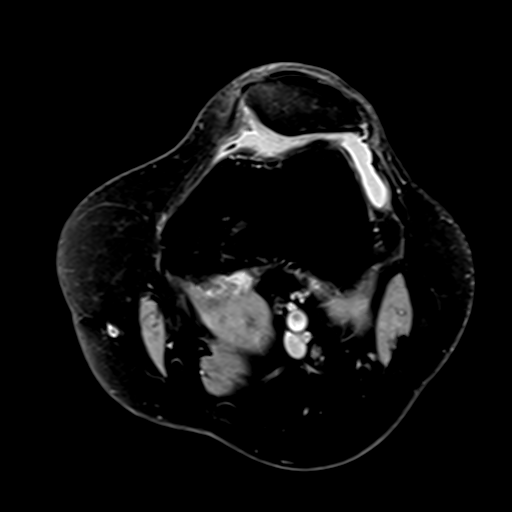
[im 21/25]
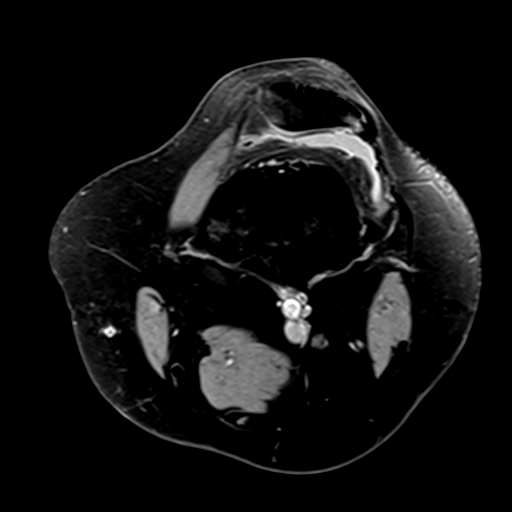
[im 25/25]
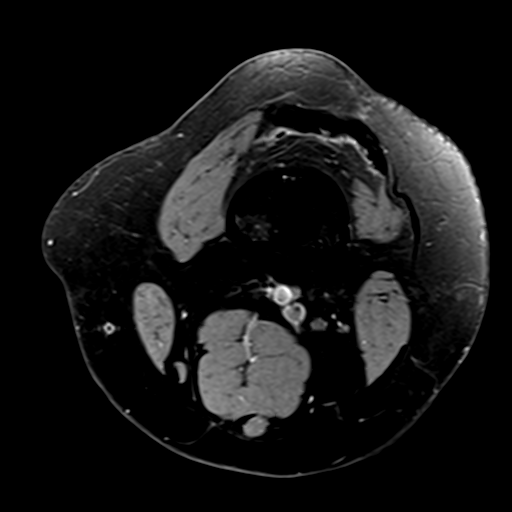

[Series 4: PD fat-sat · coronal · 3.5mm · 0.29mm/px · 5 of 22 slices shown (2 of 3)]
[im 1/22]
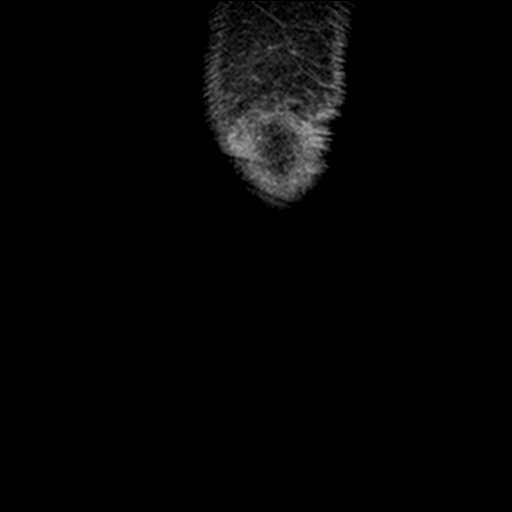
[im 4/22]
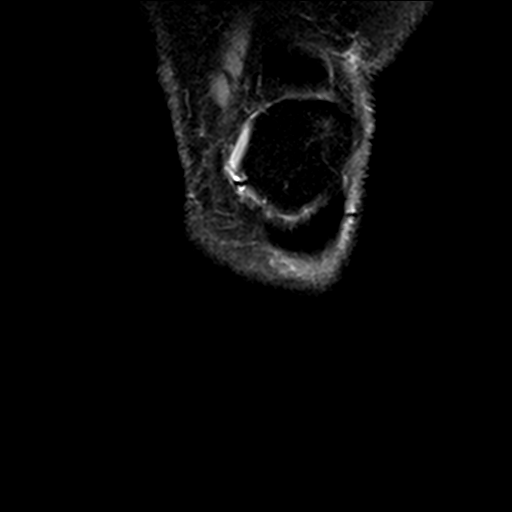
[im 7/22]
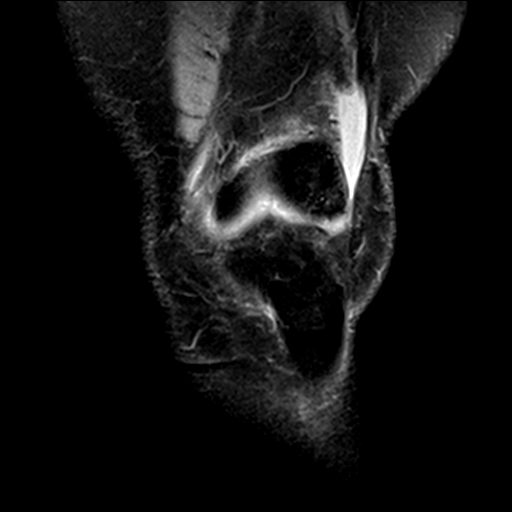
[im 13/22]
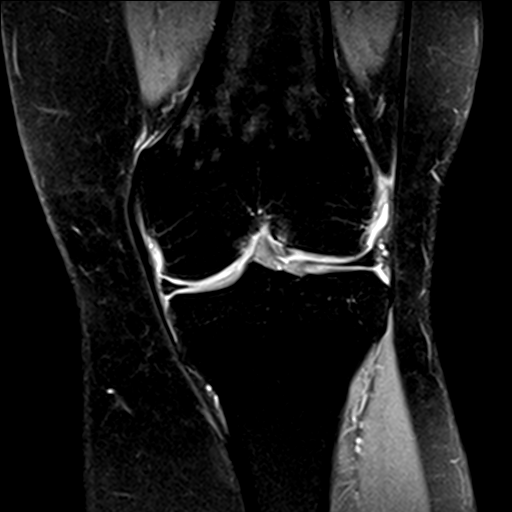
[im 19/22]
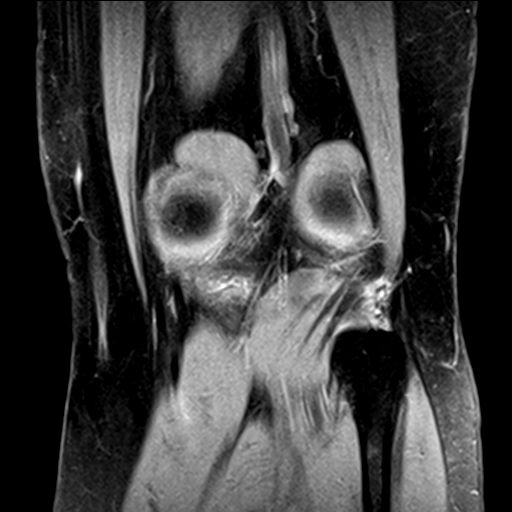

[Series 5: T2 fat-sat · coronal · 3.5mm · 0.29mm/px · 3 of 22 slices shown]
[im 4/22]
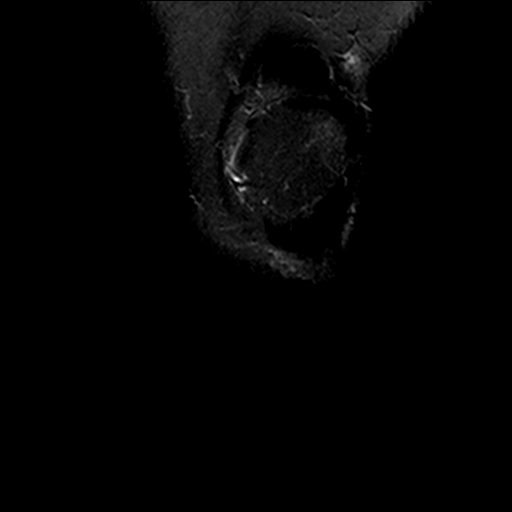
[im 13/22]
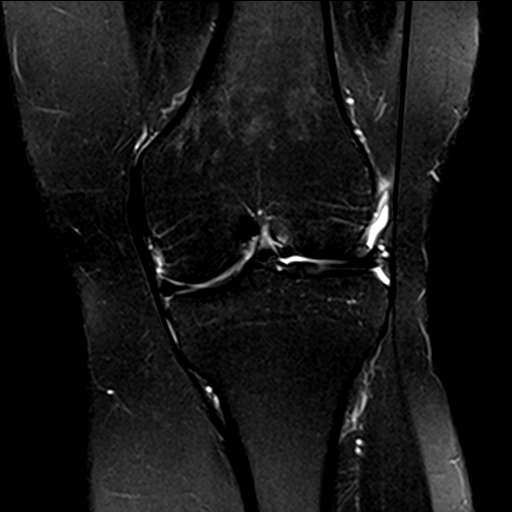
[im 19/22]
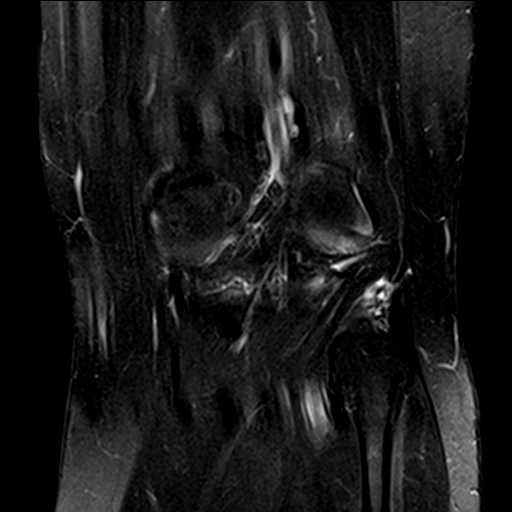

[Series 7: PD fat-sat · sagittal · 3.2mm · 0.29mm/px · 3 of 24 slices shown (3 of 3)]
[im 4/24]
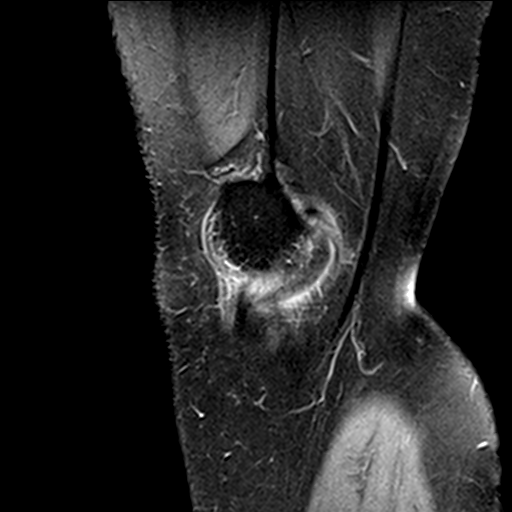
[im 14/24]
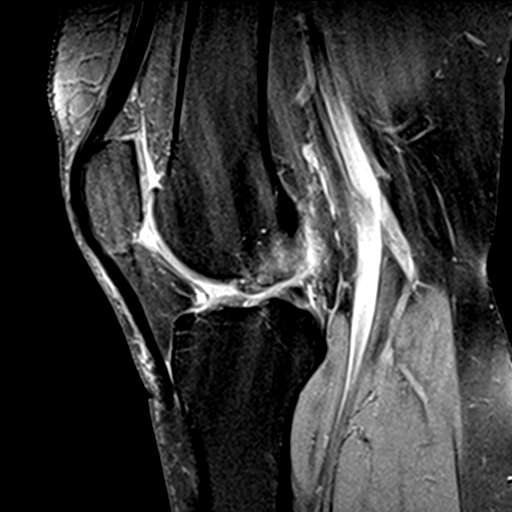
[im 20/24]
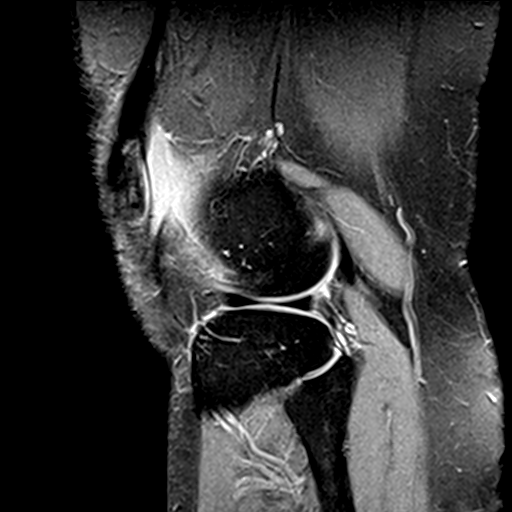

[19 of 40 positions shown; findings below may reference images not displayed]

FINDINGS: MENISCI

Medial meniscus: Intact with mild degenerative signal in the
posterior horn noted.

Lateral meniscus:  Intact.

LIGAMENTS

Cruciates:  Intact.

Collaterals:  Intact.

CARTILAGE

Patellofemoral: The patella demonstrates lateral tilt. Marked
hyaline cartilage loss is seen about the lateral patellar facet and
lateral femoral trochlea with small underlying subchondral cysts
identified.

Medial:  Intact.

Lateral: Hyaline cartilage thinning and irregularity are seen
centrally in the lateral compartment without underlying reactive
marrow signal change or full-thickness defect identified.

Joint:  Very small joint effusion is noted.

Popliteal Fossa:  No Baker's cyst.

Extensor Mechanism:  Intact.

Bones: Small osteophytes are identified about the knee. No worrisome
marrow lesion is seen.
IMPRESSION: Marked hyaline cartilage loss about the lateral patellar facet and
lateral femoral trochlea with associated small subchondral cysts
identified consistent with grade 4 chondromalacia. Chondromalacia
centrally in the lateral compartment is grade 2-3.

Negative for meniscal or ligament tear.

## 2017-03-05 NOTE — Therapy (Signed)
[facility] [address]  [place], [place], [address] Phone: [phone]   Fax:  [phone]  Physical Therapy Treatment  Patient Details  Name: [name] [name] MRN: [id number] Date of Birth: 03-25-1966 Referring Provider: Dr. Durward Fortes  Encounter Date: 03/05/2017      PT End of Session - 03/05/17 1503    Visit Number 11   Number of Visits 14   Date for PT Re-Evaluation 03/25/17   PT Start Time 1016   PT Stop Time 1130  patient approaching PT after following treatment slot to review HEP   PT Time Calculation (min) 74 min   Activity Tolerance Patient tolerated treatment well   Behavior During Therapy [facility] for tasks assessed/performed      Past Medical History:  Diagnosis Date  . Anxiety   . Arthritis   . Bell's palsy 2016   left side   . Cancer ([place])    skin cancer- left temporal " sitting on bone"  . Depression   . Hypercholesteremia   . PONV (postoperative nausea and vomiting)    would like scope patch    Past Surgical History:  Procedure Laterality Date  . CHONDROPLASTY Left 06/29/2015   Procedure: CHONDROPLASTY;  Surgeon: Garald Balding, MD;  Location: [place];  Service: Orthopedics;  Laterality: Left;  . FOOT SURGERY Bilateral    Bone Spurs  . KNEE ARTHROSCOPY WITH DRILLING/MICROFRACTURE Left 06/29/2015   Procedure: KNEE ARTHROSCOPY WITH DRILLING/MICROFRACTURE;  Surgeon: Garald Balding, MD;  Location: [place];  Service: Orthopedics;  Laterality: Left;  . KNEE ARTHROSCOPY WITH LATERAL MENISECTOMY Left 06/29/2015   Procedure: KNEE ARTHROSCOPY WITH LATERAL MENISECTOMY;  Surgeon: Garald Balding, MD;  Location: [place];  Service: Orthopedics;  Laterality: Left;  . KNEE ARTHROSCOPY WITH LATERAL RELEASE Left 06/29/2015   Procedure: KNEE ARTHROSCOPY WITH LATERAL RELEASE;  Surgeon: Garald Balding, MD;  Location: [place];  Service:  Orthopedics;  Laterality: Left;  . KNEE SURGERY    . Mohls Left    face  . PLANTAR FASCIA RELEASE Right 09/27/2014   Procedure: PLANTAR FASCIA RELEASE;  Surgeon: Garald Balding, MD;  Location: [place];  Service: Orthopedics;  Laterality: Right;  . TONSILLECTOMY    . TOTAL KNEE ARTHROPLASTY Left 01/21/2017   Procedure: TOTAL KNEE ARTHROPLASTY;  Surgeon: Garald Balding, MD;  Location: [place];  Service: Orthopedics;  Laterality: Left;  . Tummy tuck     2010    There were no vitals filed for this visit.      Subjective Assessment - 03/05/17 1255    Subjective Patient reporting she does not feel like pain is improving. Continues to walk with RW.    Currently in Pain? Yes   Pain Score 7    Pain Location Knee   Pain Orientation Left   Pain Descriptors / Indicators Aching   Pain Type Surgical pain   Pain Onset More than a month ago   Pain Frequency Constant                         [facility] Adult PT Treatment/Exercise - 03/05/17 0001      Knee/Hip Exercises: Stretches   Passenger transport manager Limitations prone with strap     Knee/Hip Exercises: Aerobic   Tread Mill 1.5 mph x 6 min; side stepping B sides 0.8 mph  x 2 min eacah     Knee/Hip Exercises: Machines for Strengthening   Cybex Knee Extension 25# B LE x 15 reps; 10# L LE eccentric x 15 reps - improved since last visit     Knee/Hip Exercises: Standing   Forward Step Up Left;20 reps;Hand Hold: 0;Step Height: 6"     Knee/Hip Exercises: Seated   Sit to Sand 20 reps  slow lower - focus on eccentric control     Knee/Hip Exercises: Supine   Straight Leg Raises Left;20 reps   Straight Leg Raises Limitations 2#                PT Education - 03/05/17 1501    Education provided Yes   Education Details review of HEP - re-written out per patient request   Person(s) Educated Patient   Methods Explanation;Demonstration;Handout   Comprehension Verbalized  understanding;Returned demonstration          PT Short Term Goals - 02/24/17 1015      PT SHORT TERM GOAL #1   Title pt to be independent with HEP (03/05/17)   Status Achieved     PT SHORT TERM GOAL #2   Title pt to improve L knee ROM to 0-105 (03/05/17)   Status Partially Met  3.2.18: L knee PROM 1-105 dg, L knee AROM 1-95 dg            PT Long Term Goals - 02/13/17 0941      PT LONG TERM GOAL #1   Title patient to be independent with advanced HEP (03/25/17)   Status On-going     PT LONG TERM GOAL #2   Title Patient to improve L knee ROM to 0-120 (03/25/17)   Status On-going     PT LONG TERM GOAL #3   Title patient to demonstrate appropriate gait mechanics with good heel toe gait pattern with no AD (03/25/17)   Status On-going     PT LONG TERM GOAL #4   Title patient to demonstrate no extensor lag with SLR (03/25/17)   Status On-going     PT LONG TERM GOAL #5   Title patient to demonstrate ability to ascend/descend 1 flight of stairs reciprocally (03/25/17)   Status On-going               Plan - 03/05/17 1505    Clinical Impression Statement Difficulty progressing patients strengthening program while in treatment session due to poor patient focus and need for consistent instruction, even with repeated activities from session to session. Patient approaching PT following next treatment slot to discuss HEP and to review what she should be doing - even with daily education on HEP adherence. Discussion with patient on need for good compliance with HEP as patient is nearing end of alotted visits and will soon be transitioning to HEP with good verbal understanding.   PT Treatment/Interventions ADLs/Self Care Home Management;Cryotherapy;Electrical Stimulation;Moist Heat;Ultrasound;Neuromuscular re-education;Balance training;Therapeutic exercise;Therapeutic activities;Functional mobility training;Stair training;Gait training;Patient/family education;Manual techniques;Passive range  of motion;Vasopneumatic Device;Taping   PT Next Visit Plan L knee ROM and strengthening   Consulted and Agree with Plan of Care Patient      Patient will benefit from skilled therapeutic intervention in order to improve the following deficits and impairments:  Abnormal gait, Decreased activity tolerance, Decreased balance, Decreased range of motion, Decreased mobility, Decreased strength, Difficulty walking, Increased edema, Pain  Visit Diagnosis: Acute pain of left knee  Stiffness of left knee, not elsewhere classified  Difficulty in walking, not elsewhere classified  Other abnormalities of gait and mobility  Muscle weakness (generalized)     Problem List Patient Active Problem List   Diagnosis Date Noted  . Unilateral primary osteoarthritis, left knee 01/21/2017  . S/P total knee replacement using cement, left 01/21/2017  . Chondromalacia patellae, left knee 01/02/2017  . Torn meniscus 06/29/2015  . Osteochondral defect of femoral condyle 06/29/2015  . Plantar fasciitis of right foot 09/27/2014     Lanney Gins, PT, DPT 03/05/17 3:16 PM   [facility] [address]  [place], [place], [address] Phone: [phone]   Fax:  [phone]  Name: [name] [name] MRN: [id number] Date of Birth: 1965/12/26

## 2017-03-06 ENCOUNTER — Ambulatory Visit: Payer: BLUE CROSS/BLUE SHIELD

## 2017-03-06 DIAGNOSIS — M25662 Stiffness of left knee, not elsewhere classified: Secondary | ICD-10-CM

## 2017-03-06 DIAGNOSIS — R262 Difficulty in walking, not elsewhere classified: Secondary | ICD-10-CM

## 2017-03-06 DIAGNOSIS — M6281 Muscle weakness (generalized): Secondary | ICD-10-CM

## 2017-03-06 DIAGNOSIS — M25562 Pain in left knee: Secondary | ICD-10-CM | POA: Diagnosis not present

## 2017-03-06 DIAGNOSIS — R2689 Other abnormalities of gait and mobility: Secondary | ICD-10-CM

## 2017-03-06 NOTE — Therapy (Signed)
[facility] [address]  [place], [place], [address] Phone: [phone]   Fax:  [phone]  Physical Therapy Treatment  Patient Details  Name: [name] [name] MRN: [id number] Date of Birth: [birth year] Referring Provider: Dr. Durward Fortes  Encounter Date: 03/06/2017      PT End of Session - 03/06/17 0936    Visit Number 12   Number of Visits 14   Date for PT Re-Evaluation 03/25/17   PT Start Time 0930   PT Stop Time 1028   PT Time Calculation (min) 58 min   Activity Tolerance Patient tolerated treatment well   Behavior During Therapy [facility] for tasks assessed/performed      Past Medical History:  Diagnosis Date  . Anxiety   . Arthritis   . Bell's palsy 2016   left side   . 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 Depression   . Hypercholesteremia   . PONV (postoperative nausea and vomiting)    would like scope patch    Past Surgical History:  Procedure Laterality Date  . CHONDROPLASTY Left 06/29/2015   Procedure: CHONDROPLASTY;  Surgeon: Garald Balding, MD;  Location: [place];  Service: Orthopedics;  Laterality: Left;  . FOOT SURGERY Bilateral    Bone Spurs  . KNEE ARTHROSCOPY WITH DRILLING/MICROFRACTURE Left 06/29/2015   Procedure: KNEE ARTHROSCOPY WITH DRILLING/MICROFRACTURE;  Surgeon: Garald Balding, MD;  Location: [place];  Service: Orthopedics;  Laterality: Left;  . KNEE ARTHROSCOPY WITH LATERAL MENISECTOMY Left 06/29/2015   Procedure: KNEE ARTHROSCOPY WITH LATERAL MENISECTOMY;  Surgeon: Garald Balding, MD;  Location: [place];  Service: Orthopedics;  Laterality: Left;  . KNEE ARTHROSCOPY WITH LATERAL RELEASE Left 06/29/2015   Procedure: KNEE ARTHROSCOPY WITH LATERAL RELEASE;  Surgeon: Garald Balding, MD;  Location: [place];  Service: Orthopedics;  Laterality: Left;  . KNEE SURGERY    . Mohls Left    face   . PLANTAR FASCIA RELEASE Right 09/27/2014   Procedure: PLANTAR FASCIA RELEASE;  Surgeon: Garald Balding, MD;  Location: [place];  Service: Orthopedics;  Laterality: Right;  . TONSILLECTOMY    . TOTAL KNEE ARTHROPLASTY Left 01/21/2017   Procedure: TOTAL KNEE ARTHROPLASTY;  Surgeon: Garald Balding, MD;  Location: [place];  Service: Orthopedics;  Laterality: Left;  . Tummy tuck     2010    There were no vitals filed for this visit.      Subjective Assessment - 03/06/17 0933    Subjective Pt. reporting she was able to sleep through the night without pain last night.     Currently in Pain? Yes   Pain Score 6    Pain Location Knee   Pain Orientation Left   Pain Descriptors / Indicators Aching;Sharp   Pain Type Surgical pain   Pain Onset More than a month ago   Pain Frequency Constant   Aggravating Factors  PT, stretching, mornings    Pain Relieving Factors ice, pain meds   Multiple Pain Sites No                         [facility] Adult PT Treatment/Exercise - 03/06/17 0941      Knee/Hip Exercises: Aerobic   Stationary Bike L1 x 6 min      Knee/Hip Exercises: Standing   Forward Step Up Left;20 reps;Hand Hold: 0;Step  Height: 6"   Forward Step Up Limitations cues to minimize vaulting off R LE and to maintain upright posture    Other Standing Knee Exercises toe clears 9" step focusing on reduced hip hike and maintaining upright posture - much improved today with this      Knee/Hip Exercises: Seated   Long Arc Quad Strengthening;Left;15 reps;Weights   Long Arc Quad Weight 3 lbs.   Long [employer] Limitations 5" hold   Sit to [employer] 15 reps  R foot on blue foam oval; mirror to encourage L wt. shift      Knee/Hip Exercises: Supine   Straight Leg Raises Left;20 reps   Straight Leg Raises Limitations 2#  Quad set prior to each rep   Knee Flexion AAROM;Left;15 reps   Knee Flexion Limitations heels on peanut ball; with strap; overpressure from therapist       Vasopneumatic   Number Minutes Vasopneumatic  15 minutes   Vasopnuematic Location  Knee   Vasopneumatic Pressure High   Vasopneumatic Temperature  coldest temp                  PT Short Term Goals - 02/24/17 1015      PT SHORT TERM GOAL #1   Title pt to be independent with HEP (03/05/17)   Status Achieved     PT SHORT TERM GOAL #2   Title pt to improve L knee ROM to 0-105 (03/05/17)   Status Partially Met  3.2.18: L knee PROM 1-105 dg, L knee AROM 1-95 dg            PT Long Term Goals - 02/13/17 0941      PT LONG TERM GOAL #1   Title patient to be independent with advanced HEP (03/25/17)   Status On-going     PT LONG TERM GOAL #2   Title Patient to improve L knee ROM to 0-120 (03/25/17)   Status On-going     PT LONG TERM GOAL #3   Title patient to demonstrate appropriate gait mechanics with good heel toe gait pattern with no AD (03/25/17)   Status On-going     PT LONG TERM GOAL #4   Title patient to demonstrate no extensor lag with SLR (03/25/17)   Status On-going     PT LONG TERM GOAL #5   Title patient to demonstrate ability to ascend/descend 1 flight of stairs reciprocally (03/25/17)   Status On-going               Plan - 03/06/17 6720    Clinical Impression Statement Pt. seen ambulating with SPC today.  Pt. with report of improved pain level and demonstrated improved focus with therex requiring less cueing.  Quad strengthening in standing and supine was focus of today's visit.  Standing toe-clear progressed today with pt. demonstrating improved hip/knee flexion with this.  Some hip hiking still present requiring verbal cueing to minimize.  Pt. still ambulating with forward flexed posture, decreased hip/knee flexion, and antalgic gait today with SPC.  Ice/compression to end treatment to reduce post exercise swelling and pain.  Pt. will continue to benefit from further skilled therapy to improve gait mechanics, improve LE strength, ROM, and function.    PT Treatment/Interventions ADLs/Self Care Home Management;Cryotherapy;Electrical Stimulation;Moist Heat;Ultrasound;Neuromuscular re-education;Balance training;Therapeutic exercise;Therapeutic activities;Functional mobility training;Stair training;Gait training;Patient/family education;Manual techniques;Passive range of motion;Vasopneumatic Device;Taping   PT Next Visit Plan L knee ROM and strengthening; gait training to improve mechanics      Patient will benefit from  skilled therapeutic intervention in order to improve the following deficits and impairments:  Abnormal gait, Decreased activity tolerance, Decreased balance, Decreased range of motion, Decreased mobility, Decreased strength, Difficulty walking, Increased edema, Pain  Visit Diagnosis: Acute pain of left knee  Stiffness of left knee, not elsewhere classified  Difficulty in walking, not elsewhere classified  Other abnormalities of gait and mobility  Muscle weakness (generalized)     Problem List Patient Active Problem List   Diagnosis Date Noted  . Unilateral primary osteoarthritis, left knee 01/21/2017  . S/P total knee replacement using cement, left 01/21/2017  . Chondromalacia patellae, left knee 01/02/2017  . Torn meniscus 06/29/2015  . Osteochondral defect of femoral condyle 06/29/2015  . Plantar fasciitis of right foot 09/27/2014    Bess Harvest, PTA 03/06/17 11:19 AM   [facility] [address]  [place], [place], [address] Phone: [phone]   Fax:  [phone]  Name: [name] [name] MRN: [id number] Date of Birth: Oct 07, 1966

## 2017-03-10 ENCOUNTER — Ambulatory Visit: Payer: BLUE CROSS/BLUE SHIELD

## 2017-03-10 DIAGNOSIS — R262 Difficulty in walking, not elsewhere classified: Secondary | ICD-10-CM

## 2017-03-10 DIAGNOSIS — M25662 Stiffness of left knee, not elsewhere classified: Secondary | ICD-10-CM

## 2017-03-10 DIAGNOSIS — M25562 Pain in left knee: Secondary | ICD-10-CM | POA: Diagnosis not present

## 2017-03-10 DIAGNOSIS — R2689 Other abnormalities of gait and mobility: Secondary | ICD-10-CM

## 2017-03-10 DIAGNOSIS — M6281 Muscle weakness (generalized): Secondary | ICD-10-CM

## 2017-03-10 NOTE — Therapy (Signed)
[facility] [address]  [place], [place], [address] Phone: [phone]   Fax:  [phone]  Physical Therapy Treatment  Patient Details  Name: [name] [name] MRN: [id number] Date of Birth: Nov 21, 1966 Referring Provider: Dr. Durward Fortes  Encounter Date: 03/10/2017      PT End of Session - 03/10/17 0935    Visit Number 13   Number of Visits 14   Date for PT Re-Evaluation 03/25/17   PT Start Time 0932   PT Stop Time 1032   PT Time Calculation (min) 60 min   Activity Tolerance Patient tolerated treatment well   Behavior During Therapy [facility] for tasks assessed/performed      Past Medical History:  Diagnosis Date  . Anxiety   . Arthritis   . Bell's palsy 2016   left side   . 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 Depression   . Hypercholesteremia   . PONV (postoperative nausea and vomiting)    would like scope patch    Past Surgical History:  Procedure Laterality Date  . CHONDROPLASTY Left 06/29/2015   Procedure: CHONDROPLASTY;  Surgeon: Garald Balding, MD;  Location: [place];  Service: Orthopedics;  Laterality: Left;  . FOOT SURGERY Bilateral    Bone Spurs  . KNEE ARTHROSCOPY WITH DRILLING/MICROFRACTURE Left 06/29/2015   Procedure: KNEE ARTHROSCOPY WITH DRILLING/MICROFRACTURE;  Surgeon: Garald Balding, MD;  Location: [place];  Service: Orthopedics;  Laterality: Left;  . KNEE ARTHROSCOPY WITH LATERAL MENISECTOMY Left 06/29/2015   Procedure: KNEE ARTHROSCOPY WITH LATERAL MENISECTOMY;  Surgeon: Garald Balding, MD;  Location: [place];  Service: Orthopedics;  Laterality: Left;  . KNEE ARTHROSCOPY WITH LATERAL RELEASE Left 06/29/2015   Procedure: KNEE ARTHROSCOPY WITH LATERAL RELEASE;  Surgeon: Garald Balding, MD;  Location: [place];  Service: Orthopedics;  Laterality: Left;  . KNEE SURGERY    . Mohls Left    face   . PLANTAR FASCIA RELEASE Right 09/27/2014   Procedure: PLANTAR FASCIA RELEASE;  Surgeon: Garald Balding, MD;  Location: [place];  Service: Orthopedics;  Laterality: Right;  . TONSILLECTOMY    . TOTAL KNEE ARTHROPLASTY Left 01/21/2017   Procedure: TOTAL KNEE ARTHROPLASTY;  Surgeon: Garald Balding, MD;  Location: [place];  Service: Orthopedics;  Laterality: Left;  . Tummy tuck     2010    There were no vitals filed for this visit.      Subjective Assessment - 03/10/17 0936    Subjective Pt. reporting increased L knee pain and with some L ankle swelling over weekend.     Currently in Pain? Yes   Pain Score 7    Pain Location Knee   Pain Orientation Left   Pain Descriptors / Indicators Aching;Sharp   Pain Type Surgical pain   Pain Radiating Towards n/a   Pain Onset More than a month ago   Pain Frequency Constant   Multiple Pain Sites No                         [facility] Adult PT Treatment/Exercise - 03/10/17 0946      Ambulation/Gait   Ambulation/Gait Yes   Ambulation/Gait Assistance 5: Supervision   Ambulation Distance (Feet) 700 Feet   Assistive device Straight cane   Gait Pattern Step-through pattern;Decreased step length - right;Decreased stance time - left;Decreased hip/knee  flexion - left;Decreased dorsiflexion - left;Decreased weight shift to left;Antalgic;Trunk flexed   Ambulation Surface Level;Indoor     Knee/Hip Exercises: Aerobic   Stationary Bike L1 x 6 min      Knee/Hip Exercises: Standing   Forward Step Up Left;20 reps;Hand Hold: 0;Step Height: 6"  occasional UE support on counter    Forward Step Up Limitations cues to maintain upright posture and to prevent circumduction    Step Down Left;Hand Hold: 1;Step Height: 4";20 reps     Knee/Hip Exercises: Supine   Straight Leg Raises Left;20 reps  quad set prior each rep   Straight Leg Raises Limitations 2#     Vasopneumatic   Number Minutes Vasopneumatic  15 minutes    Vasopnuematic Location  Knee  L    Vasopneumatic Pressure High   Vasopneumatic Temperature  coldest temp     Manual Therapy   Manual Therapy Passive ROM   Passive ROM L knee flexion/extension gentle stretching                 PT Education - 03/10/17 1631    Education provided Yes   Education Details heel raise    Person(s) Educated Patient   Methods Explanation;Demonstration;Handout;Verbal cues   Comprehension Verbalized understanding;Returned demonstration;Verbal cues required;Need further instruction          PT Short Term Goals - 02/24/17 1015      PT SHORT TERM GOAL #1   Title pt to be independent with HEP (03/05/17)   Status Achieved     PT SHORT TERM GOAL #2   Title pt to improve L knee ROM to 0-105 (03/05/17)   Status Partially Met  3.2.18: L knee PROM 1-105 dg, L knee AROM 1-95 dg            PT Long Term Goals - 02/13/17 0941      PT LONG TERM GOAL #1   Title patient to be independent with advanced HEP (03/25/17)   Status On-going     PT LONG TERM GOAL #2   Title Patient to improve L knee ROM to 0-120 (03/25/17)   Status On-going     PT LONG TERM GOAL #3   Title patient to demonstrate appropriate gait mechanics with good heel toe gait pattern with no AD (03/25/17)   Status On-going     PT LONG TERM GOAL #4   Title patient to demonstrate no extensor lag with SLR (03/25/17)   Status On-going     PT LONG TERM GOAL #5   Title patient to demonstrate ability to ascend/descend 1 flight of stairs reciprocally (03/25/17)   Status On-going               Plan - 03/10/17 0935    Clinical Impression Statement Pt. able to demo improved posture with stepping activity however still demonstrating poor L quad control with eccentric step down.  Pt. still ambulating with uneven wt. shift, forward flexed trunk and poor overall mechanics with SPC today.  Some ability to self-correct with cueing however poor carryover in treatment.  Pt. demonstrating poor adherence  to therapist instruction and general lack of focus in today's treatment.  Calf raise added to HEP today.  Pt. able to demo with good technique.  Pt. admitting to limited HEP compliance today despite ongoing instruction from therapists to adhere to HEP daily.  Next visit is last visit in pt. current POC.  Plan for d/c previously discussed with pt. with pt. verbalizing understanding.  Will plan  to review HEP and prepare pt. for transition to home program in coming visit.     PT Treatment/Interventions ADLs/Self Care Home Management;Cryotherapy;Electrical Stimulation;Moist Heat;Ultrasound;Neuromuscular re-education;Balance training;Therapeutic exercise;Therapeutic activities;Functional mobility training;Stair training;Gait training;Patient/family education;Manual techniques;Passive range of motion;Vasopneumatic Device;Taping   PT Next Visit Plan prepare for transition to home program and d/c       Patient will benefit from skilled therapeutic intervention in order to improve the following deficits and impairments:  Abnormal gait, Decreased activity tolerance, Decreased balance, Decreased range of motion, Decreased mobility, Decreased strength, Difficulty walking, Increased edema, Pain  Visit Diagnosis: Acute pain of left knee  Stiffness of left knee, not elsewhere classified  Difficulty in walking, not elsewhere classified  Other abnormalities of gait and mobility  Muscle weakness (generalized)     Problem List Patient Active Problem List   Diagnosis Date Noted  . Unilateral primary osteoarthritis, left knee 01/21/2017  . S/P total knee replacement using cement, left 01/21/2017  . Chondromalacia patellae, left knee 01/02/2017  . Torn meniscus 06/29/2015  . Osteochondral defect of femoral condyle 06/29/2015  . Plantar fasciitis of right foot 09/27/2014    Bess Harvest, PTA 03/10/17 4:46 PM   [facility] [address]  [place], [place], [address] Phone: [phone]   Fax:  [phone]  Name: [name] [name] MRN: [id number] Date of Birth: 09-Mar-1966

## 2017-03-12 ENCOUNTER — Ambulatory Visit: Payer: BLUE CROSS/BLUE SHIELD

## 2017-03-13 ENCOUNTER — Ambulatory Visit: Payer: BLUE CROSS/BLUE SHIELD | Admitting: Physical Therapy

## 2017-03-13 ENCOUNTER — Ambulatory Visit: Payer: BLUE CROSS/BLUE SHIELD

## 2017-03-13 DIAGNOSIS — M25562 Pain in left knee: Secondary | ICD-10-CM | POA: Diagnosis not present

## 2017-03-13 DIAGNOSIS — M6281 Muscle weakness (generalized): Secondary | ICD-10-CM

## 2017-03-13 DIAGNOSIS — M25662 Stiffness of left knee, not elsewhere classified: Secondary | ICD-10-CM

## 2017-03-13 DIAGNOSIS — R262 Difficulty in walking, not elsewhere classified: Secondary | ICD-10-CM

## 2017-03-13 DIAGNOSIS — R2689 Other abnormalities of gait and mobility: Secondary | ICD-10-CM

## 2017-03-13 NOTE — Therapy (Signed)
[facility] [address]  [place], [place], [address] Phone: [phone]   Fax:  [phone]  Physical Therapy Treatment  Patient Details  Name: [name] [name] MRN: [id number] Date of Birth: 1966/06/23 Referring Provider: Dr. Durward Fortes  Encounter Date: 03/13/2017      PT End of Session - 03/13/17 1618    Visit Number 14   Number of Visits 24   Date for PT Re-Evaluation 04/18/17   PT Start Time 1615   PT Stop Time 1730   PT Time Calculation (min) 75 min   Activity Tolerance Patient tolerated treatment well   Behavior During Therapy [facility] for tasks assessed/performed      Past Medical History:  Diagnosis Date  . Anxiety   . Arthritis   . Bell's palsy 2016   left side   . Cancer ([place])    skin cancer- left temporal " sitting on bone"  . Depression   . Hypercholesteremia   . PONV (postoperative nausea and vomiting)    would like scope patch    Past Surgical History:  Procedure Laterality Date  . CHONDROPLASTY Left 06/29/2015   Procedure: CHONDROPLASTY;  Surgeon: Garald Balding, MD;  Location: [place];  Service: Orthopedics;  Laterality: Left;  . FOOT SURGERY Bilateral    Bone Spurs  . KNEE ARTHROSCOPY WITH DRILLING/MICROFRACTURE Left 06/29/2015   Procedure: KNEE ARTHROSCOPY WITH DRILLING/MICROFRACTURE;  Surgeon: Garald Balding, MD;  Location: [place];  Service: Orthopedics;  Laterality: Left;  . KNEE ARTHROSCOPY WITH LATERAL MENISECTOMY Left 06/29/2015   Procedure: KNEE ARTHROSCOPY WITH LATERAL MENISECTOMY;  Surgeon: Garald Balding, MD;  Location: [place];  Service: Orthopedics;  Laterality: Left;  . KNEE ARTHROSCOPY WITH LATERAL RELEASE Left 06/29/2015   Procedure: KNEE ARTHROSCOPY WITH LATERAL RELEASE;  Surgeon: Garald Balding, MD;  Location: [place];  Service: Orthopedics;  Laterality: Left;  . KNEE SURGERY    . Mohls Left    face   . PLANTAR FASCIA RELEASE Right 09/27/2014   Procedure: PLANTAR FASCIA RELEASE;  Surgeon: Garald Balding, MD;  Location: [place];  Service: Orthopedics;  Laterality: Right;  . TONSILLECTOMY    . TOTAL KNEE ARTHROPLASTY Left 01/21/2017   Procedure: TOTAL KNEE ARTHROPLASTY;  Surgeon: Garald Balding, MD;  Location: [place];  Service: Orthopedics;  Laterality: Left;  . Tummy tuck     2010    There were no vitals filed for this visit.      Subjective Assessment - 03/13/17 1623    Subjective Pt. reporting pain has been worse after practicing stairs.     Currently in Pain? Yes   Pain Score 8    Pain Location Knee   Pain Orientation Left   Pain Descriptors / Indicators Aching;Sharp   Pain Type Surgical pain   Pain Radiating Towards n/a    Pain Frequency Constant   Multiple Pain Sites No            [facility] PT Assessment - 03/13/17 1642      PROM   PROM Assessment Site Knee   Right/Left Knee Left   Left Knee Extension 1    Left Knee Flexion 107                      [facility] Adult PT Treatment/Exercise - 03/13/17 1647      Ambulation/Gait   Ambulation/Gait Yes   Ambulation/Gait  Assistance 5: Supervision   Ambulation Distance (Feet) 400 Feet   Assistive device Straight cane   Gait Pattern Step-through pattern;Decreased step length - right;Decreased stance time - left;Decreased hip/knee flexion - left;Decreased dorsiflexion - left;Decreased weight shift to left;Antalgic;Trunk flexed   Ambulation Surface Level;Indoor   Stairs Yes   Stairs Assistance 5: Supervision   Stairs Assistance Details (indicate cue type and reason) 1 heavy rail use    Number of Stairs 6   Gait Comments mod cues for upright posture and even stance time today with SPC; cues provided for hip/knee flexion on swing phase; improved ability to self-correct today      Self-Care   Self-Care Other Self-Care Comments   Other Self-Care Comments  Discussion on importance of consistent  HEP peformance with rationale provided for consequences of prolonged poor adherence to HEP      Knee/Hip Exercises: Stretches   Passive Hamstring Stretch Left;1 rep;30 seconds   Passive Hamstring Stretch Limitations with therapist + gastroc      Knee/Hip Exercises: Aerobic   Stationary Bike L2  x 6 min      Knee/Hip Exercises: Standing   Terminal Knee Extension Limitations TKE with blue TB 5" x 15 reps   Forward Step Up Left;20 reps;Hand Hold: 0;Step Height: 6"   Forward Step Up Limitations light UE support on counter; working on upright posture with eccentric    Step Down Left;Hand Hold: 1;20 reps;Step Height: 6"   Step Down Limitations 1 light UE support on counter; working on upright posture with eccentric step down    Other Standing Knee Exercises toe clears 9" step focusing on reduced hip hike and maintaining upright posture      Knee/Hip Exercises: Seated   Long Arc Quad Strengthening;Left;Weights;20 reps   Long Arc Quad Weight 3 lbs.   Long [employer] Limitations 5" hold     Knee/Hip Exercises: Supine   Quad Sets Left;10 reps   Quad Sets Limitations with heel prop on bolster    Straight Leg Raises Left;15 reps   Straight Leg Raises Limitations 3#      Vasopneumatic   Number Minutes Vasopneumatic  15 minutes   Vasopnuematic Location  Knee   Vasopneumatic Pressure High   Vasopneumatic Temperature  coldest temp                PT Education - 03/13/17 1735    Education provided Yes   Education Details TKE with blue TB issued to pt.           PT Short Term Goals - 03/13/17 1738      PT SHORT TERM GOAL #1   Title pt to be independent with HEP (03/05/17)   Status Achieved     PT SHORT TERM GOAL #2   Title pt to improve L knee ROM to 0-105 (03/05/17)   Status Partially Met  3.22.18: L knee PROM 1-107 dg           PT Long Term Goals - 03/13/17 1632      PT LONG TERM GOAL #1   Title patient to be independent with advanced HEP (03/25/17)   Status On-going      PT LONG TERM GOAL #2   Title Patient to improve L knee ROM to 0-120 (03/25/17)   Status On-going  3.22.18: L knee PROM 1-107 dg      PT LONG TERM GOAL #3   Title patient to demonstrate appropriate gait mechanics with good heel toe gait pattern with  no AD (03/25/17)   Status On-going  3.22.18: Pt. still with decreased stance time on L, and occasinal forward flexed      PT LONG TERM GOAL #4   Title patient to demonstrate no extensor lag with SLR (03/25/17)   Status Achieved  3.22.18: Pt. without extensor lag however still slow to initiated movement      PT LONG TERM GOAL #5   Title patient to demonstrate ability to ascend/descend 1 flight of stairs reciprocally (03/25/17)   Status On-going  3.22.18: Pt. able to ascend/descend stairs with reciprocal pattern however requiring heavy rail use and SPC.  Visible quad instability noted with eccentric step down.                 Plan - 03/13/17 1620    Clinical Impression Statement Pt. reporting increased pain today with some mod swelling noted at L knee.  Today's treatment with gait training, stair navigation, and TKE strengthening activity.  Pt. able to perform SLR without extensor lag today however still slow to initiate movement.  Pt. still ambulating with forward flexed posture, decreased wt. shift to L, and antalgic gait with SPC.  Pt. able to ascend/descend stairs reciprocally however requiring heavy rail use and SPC.  Visible quad instability noted with eccentric step down.  Pt. with increased swelling today and able to demo L knee PROM 1-107 dg.  Pt. verbalizing she wants to be able to climb stairs better and walk to daughter's soccer games without RW.  Pt. Possibility of recert discussed with pt. and PT today with pt. and PT agreeing to extend POC to include more visits.  Pt. will continue to benefit from further skilled therapy to improve gait mechanics, ROM, and functional strength.   PT Treatment/Interventions ADLs/Self Care Home  Management;Cryotherapy;Electrical Stimulation;Moist Heat;Ultrasound;Neuromuscular re-education;Balance training;Therapeutic exercise;Therapeutic activities;Functional mobility training;Stair training;Gait training;Patient/family education;Manual techniques;Passive range of motion;Vasopneumatic Device;Taping   PT Next Visit Plan Prepare for transition to home program and d/c       Patient will benefit from skilled therapeutic intervention in order to improve the following deficits and impairments:  Abnormal gait, Decreased activity tolerance, Decreased balance, Decreased range of motion, Decreased mobility, Decreased strength, Difficulty walking, Increased edema, Pain  Visit Diagnosis: Acute pain of left knee - Plan: PT plan of care cert/re-cert  Stiffness of left knee, not elsewhere classified - Plan: PT plan of care cert/re-cert  Difficulty in walking, not elsewhere classified - Plan: PT plan of care cert/re-cert  Other abnormalities of gait and mobility - Plan: PT plan of care cert/re-cert  Muscle weakness (generalized) - Plan: PT plan of care cert/re-cert     Problem List Patient Active Problem List   Diagnosis Date Noted  . Unilateral primary osteoarthritis, left knee 01/21/2017  . S/P total knee replacement using cement, left 01/21/2017  . Chondromalacia patellae, left knee 01/02/2017  . Torn meniscus 06/29/2015  . Osteochondral defect of femoral condyle 06/29/2015  . Plantar fasciitis of right foot 09/27/2014    Bess Harvest, PTA 03/14/17 8:05 AM   [facility] [address]  [place], [place], [address] Phone: [phone]   Fax:  [phone]  Name: [name] [name] MRN: [id number] Date of Birth: [birth year]

## 2017-03-17 ENCOUNTER — Other Ambulatory Visit (INDEPENDENT_AMBULATORY_CARE_PROVIDER_SITE_OTHER): Payer: Self-pay

## 2017-03-17 ENCOUNTER — Ambulatory Visit: Payer: BLUE CROSS/BLUE SHIELD

## 2017-03-17 ENCOUNTER — Telehealth (INDEPENDENT_AMBULATORY_CARE_PROVIDER_SITE_OTHER): Payer: Self-pay | Admitting: Orthopaedic Surgery

## 2017-03-17 DIAGNOSIS — R262 Difficulty in walking, not elsewhere classified: Secondary | ICD-10-CM

## 2017-03-17 DIAGNOSIS — M25662 Stiffness of left knee, not elsewhere classified: Secondary | ICD-10-CM

## 2017-03-17 DIAGNOSIS — M25562 Pain in left knee: Secondary | ICD-10-CM | POA: Diagnosis not present

## 2017-03-17 DIAGNOSIS — R2689 Other abnormalities of gait and mobility: Secondary | ICD-10-CM

## 2017-03-17 DIAGNOSIS — M6281 Muscle weakness (generalized): Secondary | ICD-10-CM

## 2017-03-17 MED ORDER — TRAMADOL HCL 50 MG PO TABS: 50.0000 mg | ORAL_TABLET | Freq: Two times a day (BID) | ORAL | 0 refills | Status: AC

## 2017-03-17 NOTE — Telephone Encounter (Signed)
Patient is requesting refill of pain medication. She states she is in a lot of pain and especially after her rehab appointments. She says she is taking two bottles of aleve a week to try to help with the pain. She is requesting this refill  today please.

## 2017-03-17 NOTE — Telephone Encounter (Signed)
Called pt and explained for over 15 min that PW will not authorize any more oxycodone as her surgery was January 29th. Pt tearful and related she was "suffering."

## 2017-03-17 NOTE — Telephone Encounter (Signed)
Patient says she cannot take the Tramadol that was sent in and is requesting one more refill of the oxycodone.

## 2017-03-17 NOTE — Therapy (Signed)
[facility] [address]  [place], [place], [address] Phone: [phone]   Fax:  [phone]  Physical Therapy Treatment  Patient Details  Name: [name] [name] MRN: [id number] Date of Birth: [birth year] Referring Provider: Dr. Durward Fortes  Encounter Date: 03/17/2017      PT End of Session - 03/17/17 0936    Visit Number 15   Number of Visits 24   Date for PT Re-Evaluation 04/18/17   PT Start Time 0930   PT Stop Time 1027   PT Time Calculation (min) 57 min   Activity Tolerance Patient tolerated treatment well   Behavior During Therapy [facility] for tasks assessed/performed      Past Medical History:  Diagnosis Date  . Anxiety   . Arthritis   . Bell's palsy 2016   left side   . Cancer ([place])    skin cancer- left temporal " sitting on bone"  . Depression   . Hypercholesteremia   . PONV (postoperative nausea and vomiting)    would like scope patch    Past Surgical History:  Procedure Laterality Date  . CHONDROPLASTY Left 06/29/2015   Procedure: CHONDROPLASTY;  Surgeon: Garald Balding, MD;  Location: [place];  Service: Orthopedics;  Laterality: Left;  . FOOT SURGERY Bilateral    Bone Spurs  . KNEE ARTHROSCOPY WITH DRILLING/MICROFRACTURE Left 06/29/2015   Procedure: KNEE ARTHROSCOPY WITH DRILLING/MICROFRACTURE;  Surgeon: Garald Balding, MD;  Location: [place];  Service: Orthopedics;  Laterality: Left;  . KNEE ARTHROSCOPY WITH LATERAL MENISECTOMY Left 06/29/2015   Procedure: KNEE ARTHROSCOPY WITH LATERAL MENISECTOMY;  Surgeon: Garald Balding, MD;  Location: [place];  Service: Orthopedics;  Laterality: Left;  . KNEE ARTHROSCOPY WITH LATERAL RELEASE Left 06/29/2015   Procedure: KNEE ARTHROSCOPY WITH LATERAL RELEASE;  Surgeon: Garald Balding, MD;  Location: [place];  Service: Orthopedics;  Laterality: Left;  . KNEE SURGERY    . Mohls Left    face   . PLANTAR FASCIA RELEASE Right 09/27/2014   Procedure: PLANTAR FASCIA RELEASE;  Surgeon: Garald Balding, MD;  Location: [place];  Service: Orthopedics;  Laterality: Right;  . TONSILLECTOMY    . TOTAL KNEE ARTHROPLASTY Left 01/21/2017   Procedure: TOTAL KNEE ARTHROPLASTY;  Surgeon: Garald Balding, MD;  Location: [place];  Service: Orthopedics;  Laterality: Left;  . Tummy tuck     2010    There were no vitals filed for this visit.      Subjective Assessment - 03/17/17 0933    Subjective Pt. reporting pain has been worse this morning.     Currently in Pain? Yes   Pain Score 8    Pain Location Knee   Pain Orientation Left   Pain Descriptors / Indicators Aching;Sharp   Pain Type Surgical pain   Pain Onset More than a month ago   Multiple Pain Sites No                         [facility] Adult PT Treatment/Exercise - 03/17/17 0956      Ambulation/Gait   Ambulation/Gait Yes   Ambulation/Gait Assistance 5: Supervision   Ambulation Distance (Feet) 500 Feet   Assistive device Straight cane   Gait Pattern Step-through pattern;Decreased step length - right;Decreased stance time - left;Decreased hip/knee flexion - left;Decreased dorsiflexion - left;Decreased weight shift to left;Antalgic;Trunk flexed   Ambulation Surface Level;Indoor  Gait Comments cues for, "buttocks kick" to increase L hip/knee flexion with swing phase; better carryover; cues required for upright posture throughout      Knee/Hip Exercises: Aerobic   Stationary Bike L2  x 6 min   seat all the way up      Knee/Hip Exercises: Standing   Terminal Knee Extension Limitations TKE with blue TB 5" x 15 reps  mod cues for technique   Step Down Left;Hand Hold: 1;10 reps;Step Height: 4"   Step Down Limitations Heel touch; mirror training for level hips, and knee flexion      Knee/Hip Exercises: Seated   Hamstring Curl Strengthening;Left;10 reps   Hamstring Limitations blue tband; focusing on  eccentric     Knee/Hip Exercises: Supine   Straight Leg Raises Left;20 reps   Straight Leg Raises Limitations 3#    Other Supine Knee/Hip Exercises 4# toe clears to 9" step forward/lateral x 15 reps each     Vasopneumatic   Number Minutes Vasopneumatic  15 minutes   Vasopnuematic Location  Knee   Vasopneumatic Pressure High   Vasopneumatic Temperature  coldest temp                  PT Short Term Goals - 03/13/17 1738      PT SHORT TERM GOAL #1   Title pt to be independent with HEP (03/05/17)   Status Achieved     PT SHORT TERM GOAL #2   Title pt to improve L knee ROM to 0-105 (03/05/17)   Status Partially Met  3.22.18: L knee PROM 1-107 dg           PT Long Term Goals - 03/13/17 1632      PT LONG TERM GOAL #1   Title patient to be independent with advanced HEP (03/25/17)   Status On-going     PT LONG TERM GOAL #2   Title Patient to improve L knee ROM to 0-120 (03/25/17)   Status On-going  3.22.18: L knee PROM 1-107 dg      PT LONG TERM GOAL #3   Title patient to demonstrate appropriate gait mechanics with good heel toe gait pattern with no AD (03/25/17)   Status On-going  3.22.18: Pt. still with decreased stance time on L, and occasinal forward flexed      PT LONG TERM GOAL #4   Title patient to demonstrate no extensor lag with SLR (03/25/17)   Status Achieved  3.22.18: Pt. without extensor lag however still slow to initiated movement      PT LONG TERM GOAL #5   Title patient to demonstrate ability to ascend/descend 1 flight of stairs reciprocally (03/25/17)   Status On-going  3.22.18: Pt. able to ascend/descend stairs with reciprocal pattern however requiring heavy rail use and SPC.  Visible quad instability noted with eccentric step down.                 Plan - 03/17/17 0937    Clinical Impression Statement Pt. seen initially with [facility] and report of worse pain in L knee this morning.  Gait training today focusing on improving L hip/knee flexion with  better carryover in treatment.  4" eccentric lowering with heel touch added today and tolerated well - mirror training required for proper technique.  Treatment focusing on progression of eccentric quad strengthening activity.  Pt. demonstrating poor recall and technique with TKE TB HEP activity with review.  Pt. instructed to adhere to HEP for full benefit from therapy.  Ice to end treatment to reduce post-activity swelling and pain.  Pt. will continue to benefit from further skilled therapy to maximize LE strength, ROM, and function.     PT Treatment/Interventions ADLs/Self Care Home Management;Cryotherapy;Electrical Stimulation;Moist Heat;Ultrasound;Neuromuscular re-education;Balance training;Therapeutic exercise;Therapeutic activities;Functional mobility training;Stair training;Gait training;Patient/family education;Manual techniques;Passive range of motion;Vasopneumatic Device;Taping   PT Next Visit Plan Prepare for transition to home program and d/c       Patient will benefit from skilled therapeutic intervention in order to improve the following deficits and impairments:  Abnormal gait, Decreased activity tolerance, Decreased balance, Decreased range of motion, Decreased mobility, Decreased strength, Difficulty walking, Increased edema, Pain  Visit Diagnosis: Acute pain of left knee  Stiffness of left knee, not elsewhere classified  Difficulty in walking, not elsewhere classified  Other abnormalities of gait and mobility  Muscle weakness (generalized)     Problem List Patient Active Problem List   Diagnosis Date Noted  . Unilateral primary osteoarthritis, left knee 01/21/2017  . S/P total knee replacement using cement, left 01/21/2017  . Chondromalacia patellae, left knee 01/02/2017  . Torn meniscus 06/29/2015  . Osteochondral defect of femoral condyle 06/29/2015  . Plantar fasciitis of right foot 09/27/2014    Bess Harvest, PTA 03/17/17 12:38 PM   [facility] [address]  [place], [place], [address] Phone: [phone]   Fax:  [phone]  Name: [name] [name] MRN: [id number] Date of Birth: [birth year]

## 2017-03-19 ENCOUNTER — Ambulatory Visit: Payer: BLUE CROSS/BLUE SHIELD | Admitting: Physical Therapy

## 2017-03-19 DIAGNOSIS — M25662 Stiffness of left knee, not elsewhere classified: Secondary | ICD-10-CM

## 2017-03-19 DIAGNOSIS — M25562 Pain in left knee: Secondary | ICD-10-CM

## 2017-03-19 DIAGNOSIS — R2689 Other abnormalities of gait and mobility: Secondary | ICD-10-CM

## 2017-03-19 DIAGNOSIS — R262 Difficulty in walking, not elsewhere classified: Secondary | ICD-10-CM

## 2017-03-19 DIAGNOSIS — M6281 Muscle weakness (generalized): Secondary | ICD-10-CM

## 2017-03-19 NOTE — Therapy (Signed)
[facility] [address]  [place], [place], [address] Phone: [phone]   Fax:  [phone]  Physical Therapy Treatment  Patient Details  Name: [name] [name] MRN: [id number] Date of Birth: 1966-07-07 Referring Provider: Dr. Durward Fortes  Encounter Date: 03/19/2017      PT End of Session - 03/19/17 1223    Visit Number 16   Number of Visits 24   Date for PT Re-Evaluation 04/18/17   PT Start Time 0929   PT Stop Time 1030   PT Time Calculation (min) 61 min   Activity Tolerance Patient tolerated treatment well   Behavior During Therapy [facility] for tasks assessed/performed      Past Medical History:  Diagnosis Date  . Anxiety   . Arthritis   . Bell's palsy 2016   left side   . Cancer ([place])    skin cancer- left temporal " sitting on bone"  . Depression   . Hypercholesteremia   . PONV (postoperative nausea and vomiting)    would like scope patch    Past Surgical History:  Procedure Laterality Date  . CHONDROPLASTY Left 06/29/2015   Procedure: CHONDROPLASTY;  Surgeon: Garald Balding, MD;  Location: [place];  Service: Orthopedics;  Laterality: Left;  . FOOT SURGERY Bilateral    Bone Spurs  . KNEE ARTHROSCOPY WITH DRILLING/MICROFRACTURE Left 06/29/2015   Procedure: KNEE ARTHROSCOPY WITH DRILLING/MICROFRACTURE;  Surgeon: Garald Balding, MD;  Location: [place];  Service: Orthopedics;  Laterality: Left;  . KNEE ARTHROSCOPY WITH LATERAL MENISECTOMY Left 06/29/2015   Procedure: KNEE ARTHROSCOPY WITH LATERAL MENISECTOMY;  Surgeon: Garald Balding, MD;  Location: [place];  Service: Orthopedics;  Laterality: Left;  . KNEE ARTHROSCOPY WITH LATERAL RELEASE Left 06/29/2015   Procedure: KNEE ARTHROSCOPY WITH LATERAL RELEASE;  Surgeon: Garald Balding, MD;  Location: [place];  Service: Orthopedics;  Laterality: Left;  . KNEE SURGERY    . Mohls Left    face   . PLANTAR FASCIA RELEASE Right 09/27/2014   Procedure: PLANTAR FASCIA RELEASE;  Surgeon: Garald Balding, MD;  Location: [place];  Service: Orthopedics;  Laterality: Right;  . TONSILLECTOMY    . TOTAL KNEE ARTHROPLASTY Left 01/21/2017   Procedure: TOTAL KNEE ARTHROPLASTY;  Surgeon: Garald Balding, MD;  Location: [place];  Service: Orthopedics;  Laterality: Left;  . Tummy tuck     2010    There were no vitals filed for this visit.      Subjective Assessment - 03/19/17 0932    Subjective Continues to have high pain levels - attributes this to multiple knee surgeries   Currently in Pain? Yes   Pain Score 7    Pain Location Knee   Pain Orientation Left   Pain Descriptors / Indicators Aching;Tightness   Pain Type Surgical pain                         [facility] Adult PT Treatment/Exercise - 03/19/17 0321      Ambulation/Gait   Ambulation/Gait Yes   Ambulation/Gait Assistance 6: Modified independent (Device/Increase time)   Ambulation Distance (Feet) 200 Feet   Assistive device None   Gait Pattern Step-through pattern;Decreased step length - right;Decreased stance time - left   Ambulation Surface Level;Indoor     Knee/Hip Exercises: Aerobic   Stationary Bike L2 x 6 minutes   Elliptical L1 x 4 minutes working  on weight bearing and hip/knee flexion - hevay VC/TC for weight shift and upright posture - hopeful for carryover into gait     Knee/Hip Exercises: Machines for Strengthening   Cybex Knee Extension 35# B LE x 15 reps; 10# L LE eccentric x 15 reps      Knee/Hip Exercises: Standing   Terminal Knee Extension Limitations TKE with blue TB 5" x 15 reps   Other Standing Knee Exercises toe clears to 8" step - 5# at ankle x 20 reps   Other Standing Knee Exercises attempted step downs (eccentric) - transitioned to L SL stance with knee flexion (required placing R LE posterior on stool) x 20 reps     Modalities   Modalities Vasopneumatic      Vasopneumatic   Number Minutes Vasopneumatic  15 minutes   Vasopnuematic Location  Knee   Vasopneumatic Pressure High   Vasopneumatic Temperature  coldest temp                  PT Short Term Goals - 03/13/17 1738      PT SHORT TERM GOAL #1   Title pt to be independent with HEP (03/05/17)   Status Achieved     PT SHORT TERM GOAL #2   Title pt to improve L knee ROM to 0-105 (03/05/17)   Status Partially Met  3.22.18: L knee PROM 1-107 dg           PT Long Term Goals - 03/13/17 1632      PT LONG TERM GOAL #1   Title patient to be independent with advanced HEP (03/25/17)   Status On-going     PT LONG TERM GOAL #2   Title Patient to improve L knee ROM to 0-120 (03/25/17)   Status On-going  3.22.18: L knee PROM 1-107 dg      PT LONG TERM GOAL #3   Title patient to demonstrate appropriate gait mechanics with good heel toe gait pattern with no AD (03/25/17)   Status On-going  3.22.18: Pt. still with decreased stance time on L, and occasinal forward flexed      PT LONG TERM GOAL #4   Title patient to demonstrate no extensor lag with SLR (03/25/17)   Status Achieved  3.22.18: Pt. without extensor lag however still slow to initiated movement      PT LONG TERM GOAL #5   Title patient to demonstrate ability to ascend/descend 1 flight of stairs reciprocally (03/25/17)   Status On-going  3.22.18: Pt. able to ascend/descend stairs with reciprocal pattern however requiring heavy rail use and SPC.  Visible quad instability noted with eccentric step down.                 Plan - 03/19/17 1225    Clinical Impression Statement [name] [name] today without SPC - gait training progressing with some improvement in hip/knee flexion coupled with ankle DF. Patient working on elliptical today with heavy VC/TC for weight shift, upright posture, and knee flexion through swing phase for hopeful carryover into gait. Unable to do eccentric step down today due to patient tendency to only flex torso and  drop hip rather than perform knee flexion - transition to L SL with knee flexion with better performance.    PT Treatment/Interventions ADLs/Self Care Home Management;Cryotherapy;Electrical Stimulation;Moist Heat;Ultrasound;Neuromuscular re-education;Balance training;Therapeutic exercise;Therapeutic activities;Functional mobility training;Stair training;Gait training;Patient/family education;Manual techniques;Passive range of motion;Vasopneumatic Device;Taping   Consulted and Agree with Plan of Care Patient      Patient will benefit from  skilled therapeutic intervention in order to improve the following deficits and impairments:  Abnormal gait, Decreased activity tolerance, Decreased balance, Decreased range of motion, Decreased mobility, Decreased strength, Difficulty walking, Increased edema, Pain  Visit Diagnosis: Acute pain of left knee  Stiffness of left knee, not elsewhere classified  Difficulty in walking, not elsewhere classified  Other abnormalities of gait and mobility  Muscle weakness (generalized)     Problem List Patient Active Problem List   Diagnosis Date Noted  . Unilateral primary osteoarthritis, left knee 01/21/2017  . S/P total knee replacement using cement, left 01/21/2017  . Chondromalacia patellae, left knee 01/02/2017  . Torn meniscus 06/29/2015  . Osteochondral defect of femoral condyle 06/29/2015  . Plantar fasciitis of right foot 09/27/2014     Lanney Gins, PT, DPT 03/19/17 12:42 PM   [facility] [address]  [place], [place], [address] Phone: [phone]   Fax:  [phone]  Name: [name] [name] MRN: [id number] Date of Birth: 29-Jul-1966

## 2017-03-20 ENCOUNTER — Ambulatory Visit: Payer: BLUE CROSS/BLUE SHIELD | Admitting: Physical Therapy

## 2017-03-21 ENCOUNTER — Encounter: Payer: BLUE CROSS/BLUE SHIELD | Admitting: Physical Therapy

## 2017-03-24 ENCOUNTER — Ambulatory Visit: Payer: BLUE CROSS/BLUE SHIELD | Attending: Orthopaedic Surgery | Admitting: Physical Therapy

## 2017-03-24 DIAGNOSIS — M25562 Pain in left knee: Secondary | ICD-10-CM | POA: Diagnosis present

## 2017-03-24 DIAGNOSIS — M25662 Stiffness of left knee, not elsewhere classified: Secondary | ICD-10-CM | POA: Insufficient documentation

## 2017-03-24 DIAGNOSIS — R2689 Other abnormalities of gait and mobility: Secondary | ICD-10-CM | POA: Insufficient documentation

## 2017-03-24 DIAGNOSIS — M6281 Muscle weakness (generalized): Secondary | ICD-10-CM | POA: Diagnosis present

## 2017-03-24 DIAGNOSIS — R262 Difficulty in walking, not elsewhere classified: Secondary | ICD-10-CM | POA: Diagnosis present

## 2017-03-24 NOTE — Therapy (Signed)
[facility] [address]  [place], [place], [address] Phone: [phone]   Fax:  [phone]  Physical Therapy Treatment  Patient Details  Name: [name] [name] MRN: [id number] Date of Birth: December 26, 1965 Referring Provider: Dr. Durward Fortes  Encounter Date: 03/24/2017      PT End of Session - 03/24/17 0933    Visit Number 17   Number of Visits 24   Date for PT Re-Evaluation 04/18/17   PT [name] Time 0931   PT Stop Time 1035   PT Time Calculation (min) 64 min   Activity Tolerance Patient tolerated treatment well   Behavior During Therapy [facility] for tasks assessed/performed      Past Medical History:  Diagnosis Date  . Anxiety   . Arthritis   . Bell's palsy 2016   left side   . 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 Depression   . Hypercholesteremia   . PONV (postoperative nausea and vomiting)    would like scope patch    Past Surgical History:  Procedure Laterality Date  . CHONDROPLASTY Left 06/29/2015   Procedure: CHONDROPLASTY;  Surgeon: Garald Balding, MD;  Location: [facility];  Service: Orthopedics;  Laterality: Left;  . FOOT SURGERY Bilateral    Bone Spurs  . KNEE ARTHROSCOPY WITH DRILLING/MICROFRACTURE Left 06/29/2015   Procedure: KNEE ARTHROSCOPY WITH DRILLING/MICROFRACTURE;  Surgeon: Garald Balding, MD;  Location: [place];  Service: Orthopedics;  Laterality: Left;  . KNEE ARTHROSCOPY WITH LATERAL MENISECTOMY Left 06/29/2015   Procedure: KNEE ARTHROSCOPY WITH LATERAL MENISECTOMY;  Surgeon: Garald Balding, MD;  Location: [place];  Service: Orthopedics;  Laterality: Left;  . KNEE ARTHROSCOPY WITH LATERAL RELEASE Left 06/29/2015   Procedure: KNEE ARTHROSCOPY WITH LATERAL RELEASE;  Surgeon: Garald Balding, MD;  Location: [place];  Service: Orthopedics;  Laterality: Left;  . KNEE SURGERY    . Mohls Left    face   . PLANTAR FASCIA RELEASE Right 09/27/2014   Procedure: PLANTAR FASCIA RELEASE;  Surgeon: Garald Balding, MD;  Location: [place];  Service: Orthopedics;  Laterality: Right;  . TONSILLECTOMY    . TOTAL KNEE ARTHROPLASTY Left 01/21/2017   Procedure: TOTAL KNEE ARTHROPLASTY;  Surgeon: Garald Balding, MD;  Location: [place];  Service: Orthopedics;  Laterality: Left;  . Tummy tuck     2010    There were no vitals filed for this visit.      Subjective Assessment - 03/24/17 0932    Subjective Feeling well - continues to be woken by pain   Currently in Pain? Yes   Pain Score 6    Pain Location Knee   Pain Orientation Left   Pain Descriptors / Indicators Aching;Tightness   Pain Type Surgical pain                         [facility] Adult PT Treatment/Exercise - 03/24/17 0934      Knee/Hip Exercises: Aerobic   Stationary Bike L3 x 4 minutes   Elliptical L1 x 4 minutes     Knee/Hip Exercises: Machines for Strengthening   Cybex Knee Extension 10# L LE eccentric x 15 reps   Cybex Knee Flexion 35# B LE x 15 reps     Knee/Hip Exercises: Standing   Forward Lunges Left;20 reps   Forward Lunges Limitations R LE on stool  posterior (SL hip/knee flexion)   Forward Step Up Left;20 reps;Hand Hold: 1   Forward Step Up Limitations 9" stool - 5# at ankle   Step Down Left;1 set;15 reps;Hand Hold: 1;Step Height: 4"     Knee/Hip Exercises: Supine   Straight Leg Raises Left;20 reps   Straight Leg Raises Limitations 5#     Modalities   Modalities Vasopneumatic     Vasopneumatic   Number Minutes Vasopneumatic  15 minutes   Vasopnuematic Location  Knee   Vasopneumatic Pressure High   Vasopneumatic Temperature  coldest temp                  PT Short Term Goals - 03/13/17 1738      PT SHORT TERM GOAL #1   Title pt to be independent with HEP (03/05/17)   Status Achieved     PT SHORT TERM GOAL #2   Title pt to improve L knee ROM to 0-105 (03/05/17)    Status Partially Met  3.22.18: L knee PROM 1-107 dg           PT Long Term Goals - 03/13/17 1632      PT LONG TERM GOAL #1   Title patient to be independent with advanced HEP (03/25/17)   Status On-going     PT LONG TERM GOAL #2   Title Patient to improve L knee ROM to 0-120 (03/25/17)   Status On-going  3.22.18: L knee PROM 1-107 dg      PT LONG TERM GOAL #3   Title patient to demonstrate appropriate gait mechanics with good heel toe gait pattern with no AD (03/25/17)   Status On-going  3.22.18: Pt. still with decreased stance time on L, and occasinal forward flexed      PT LONG TERM GOAL #4   Title patient to demonstrate no extensor lag with SLR (03/25/17)   Status Achieved  3.22.18: Pt. without extensor lag however still slow to initiated movement      PT LONG TERM GOAL #5   Title patient to demonstrate ability to ascend/descend 1 flight of stairs reciprocally (03/25/17)   Status On-going  3.22.18: Pt. able to ascend/descend stairs with reciprocal pattern however requiring heavy rail use and SPC.  Visible quad instability noted with eccentric step down.                 Plan - 03/24/17 0933    Clinical Impression Statement Improved mechanics today on elliptical as well as during L SL hip/knee flexion. DOes continue to ambulate with reduced knee flexion with coupled hip circumduction, even with continued VC for good gait mechanics. Improving eccentric quad control but does require increased motivation to complete task. Will continue to progress functional strength and mobility with good gait mechanics and overall strength.   PT Treatment/Interventions ADLs/Self Care Home Management;Cryotherapy;Electrical Stimulation;Moist Heat;Ultrasound;Neuromuscular re-education;Balance training;Therapeutic exercise;Therapeutic activities;Functional mobility training;Stair training;Gait training;Patient/family education;Manual techniques;Passive range of motion;Vasopneumatic Device;Taping    Consulted and Agree with Plan of Care Patient      Patient will benefit from skilled therapeutic intervention in order to improve the following deficits and impairments:  Abnormal gait, Decreased activity tolerance, Decreased balance, Decreased range of motion, Decreased mobility, Decreased strength, Difficulty walking, Increased edema, Pain  Visit Diagnosis: Acute pain of left knee  Stiffness of left knee, not elsewhere classified  Difficulty in walking, not elsewhere classified  Other abnormalities of gait and mobility  Muscle weakness (generalized)     Problem List Patient Active Problem List  Diagnosis Date Noted  . Unilateral primary osteoarthritis, left knee 01/21/2017  . S/P total knee replacement using cement, left 01/21/2017  . Chondromalacia patellae, left knee 01/02/2017  . Torn meniscus 06/29/2015  . Osteochondral defect of femoral condyle 06/29/2015  . Plantar fasciitis of right foot 09/27/2014     Lanney Gins, PT, DPT 03/24/17 10:39 AM   [facility] [address]  [place], [place], [address] Phone: [phone]   Fax:  [phone]  Name: Rosette Bellavance MRN: [id number] Date of Birth: [birth year]

## 2017-03-27 ENCOUNTER — Ambulatory Visit: Payer: BLUE CROSS/BLUE SHIELD | Admitting: Physical Therapy

## 2017-03-27 DIAGNOSIS — R2689 Other abnormalities of gait and mobility: Secondary | ICD-10-CM

## 2017-03-27 DIAGNOSIS — M25562 Pain in left knee: Secondary | ICD-10-CM

## 2017-03-27 DIAGNOSIS — M6281 Muscle weakness (generalized): Secondary | ICD-10-CM

## 2017-03-27 DIAGNOSIS — M25662 Stiffness of left knee, not elsewhere classified: Secondary | ICD-10-CM

## 2017-03-27 DIAGNOSIS — R262 Difficulty in walking, not elsewhere classified: Secondary | ICD-10-CM

## 2017-03-27 NOTE — Therapy (Signed)
[facility] [address]  [place], [place], [address] Phone: [phone]   Fax:  [phone]  Physical Therapy Treatment  Patient Details  Name: [name] [name] MRN: [id number] Date of Birth: 1965/12/29 Referring Provider: Dr. Durward Fortes  Encounter Date: 03/27/2017      PT End of Session - 03/27/17 0931    Visit Number 18   Number of Visits 24   Date for PT Re-Evaluation 04/18/17   PT Start Time 0928   PT Stop Time 1031   PT Time Calculation (min) 63 min   Activity Tolerance Patient tolerated treatment well   Behavior During Therapy [facility] for tasks assessed/performed      Past Medical History:  Diagnosis Date  . Anxiety   . Arthritis   . Bell's palsy 2016   left side   . 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 Depression   . Hypercholesteremia   . PONV (postoperative nausea and vomiting)    would like scope patch    Past Surgical History:  Procedure Laterality Date  . CHONDROPLASTY Left 06/29/2015   Procedure: CHONDROPLASTY;  Surgeon: Garald Balding, MD;  Location: [place];  Service: Orthopedics;  Laterality: Left;  . FOOT SURGERY Bilateral    Bone Spurs  . KNEE ARTHROSCOPY WITH DRILLING/MICROFRACTURE Left 06/29/2015   Procedure: KNEE ARTHROSCOPY WITH DRILLING/MICROFRACTURE;  Surgeon: Garald Balding, MD;  Location: [place];  Service: Orthopedics;  Laterality: Left;  . KNEE ARTHROSCOPY WITH LATERAL MENISECTOMY Left 06/29/2015   Procedure: KNEE ARTHROSCOPY WITH LATERAL MENISECTOMY;  Surgeon: Garald Balding, MD;  Location: [place];  Service: Orthopedics;  Laterality: Left;  . KNEE ARTHROSCOPY WITH LATERAL RELEASE Left 06/29/2015   Procedure: KNEE ARTHROSCOPY WITH LATERAL RELEASE;  Surgeon: Garald Balding, MD;  Location: [place];  Service: Orthopedics;  Laterality: Left;  . KNEE SURGERY    . Mohls Left    face   . PLANTAR FASCIA RELEASE Right 09/27/2014   Procedure: PLANTAR FASCIA RELEASE;  Surgeon: Garald Balding, MD;  Location: [place];  Service: Orthopedics;  Laterality: Right;  . TONSILLECTOMY    . TOTAL KNEE ARTHROPLASTY Left 01/21/2017   Procedure: TOTAL KNEE ARTHROPLASTY;  Surgeon: Garald Balding, MD;  Location: [place];  Service: Orthopedics;  Laterality: Left;  . Tummy tuck     2010    There were no vitals filed for this visit.      Subjective Assessment - 03/27/17 0930    Subjective Has been trying to do some yard work - some soreness   Currently in Pain? Yes   Pain Score 7    Pain Location Knee   Pain Orientation Left   Pain Descriptors / Indicators Aching;Tightness;Sore   Pain Type Surgical pain                         [facility] Adult PT Treatment/Exercise - 03/27/17 0931      Knee/Hip Exercises: Aerobic   Elliptical L1 x 6 minutes     Knee/Hip Exercises: Machines for Strengthening   Cybex Knee Extension 15# L LE eccentric x 20 reps   Cybex Knee Flexion 35# B LE x 15 reps     Knee/Hip Exercises: Standing   Forward Lunges Left;20 reps   Forward Lunges Limitations R LE on stool posterior (SL hip/knee flexion)  Terminal Knee Extension Limitations TKE with blue TB 5" x 15 reps   Step Down Left;15 reps;Hand Hold: 2;Step Height: 6"   Wall Squat 15 reps     Knee/Hip Exercises: Seated   Long Arc Quad Strengthening;Left;15 reps;Weights   Long Arc Quad Weight 4 lbs.   Long [employer] Limitations with ball squeeze     Modalities   Modalities Vasopneumatic     Vasopneumatic   Number Minutes Vasopneumatic  15 minutes   Vasopnuematic Location  Knee   Vasopneumatic Pressure High   Vasopneumatic Temperature  coldest temp                  PT Short Term Goals - 03/13/17 1738      PT SHORT TERM GOAL #1   Title pt to be independent with HEP (03/05/17)   Status Achieved     PT SHORT TERM GOAL #2   Title pt to improve L knee ROM  to 0-105 (03/05/17)   Status Partially Met  3.22.18: L knee PROM 1-107 dg           PT Long Term Goals - 03/13/17 1632      PT LONG TERM GOAL #1   Title patient to be independent with advanced HEP (03/25/17)   Status On-going     PT LONG TERM GOAL #2   Title Patient to improve L knee ROM to 0-120 (03/25/17)   Status On-going  3.22.18: L knee PROM 1-107 dg      PT LONG TERM GOAL #3   Title patient to demonstrate appropriate gait mechanics with good heel toe gait pattern with no AD (03/25/17)   Status On-going  3.22.18: Pt. still with decreased stance time on L, and occasinal forward flexed      PT LONG TERM GOAL #4   Title patient to demonstrate no extensor lag with SLR (03/25/17)   Status Achieved  3.22.18: Pt. without extensor lag however still slow to initiated movement      PT LONG TERM GOAL #5   Title patient to demonstrate ability to ascend/descend 1 flight of stairs reciprocally (03/25/17)   Status On-going  3.22.18: Pt. able to ascend/descend stairs with reciprocal pattern however requiring heavy rail use and SPC.  Visible quad instability noted with eccentric step down.                 Plan - 03/27/17 0931    Clinical Impression Statement [name] [name] doing well today - able to improve eccentric quad strength with greater control. Patient does require continued VC to remain on task and to finish each exercise. Overall improving gait mechanics and general LE strength well.    PT Treatment/Interventions ADLs/Self Care Home Management;Cryotherapy;Electrical Stimulation;Moist Heat;Ultrasound;Neuromuscular re-education;Balance training;Therapeutic exercise;Therapeutic activities;Functional mobility training;Stair training;Gait training;Patient/family education;Manual techniques;Passive range of motion;Vasopneumatic Device;Taping   Consulted and Agree with Plan of Care Patient      Patient will benefit from skilled therapeutic intervention in order to improve the following deficits  and impairments:  Abnormal gait, Decreased activity tolerance, Decreased balance, Decreased range of motion, Decreased mobility, Decreased strength, Difficulty walking, Increased edema, Pain  Visit Diagnosis: Acute pain of left knee  Stiffness of left knee, not elsewhere classified  Difficulty in walking, not elsewhere classified  Other abnormalities of gait and mobility  Muscle weakness (generalized)     Problem List Patient Active Problem List   Diagnosis Date Noted  . Unilateral primary osteoarthritis, left knee 01/21/2017  . S/P total knee replacement using  cement, left 01/21/2017  . Chondromalacia patellae, left knee 01/02/2017  . Torn meniscus 06/29/2015  . Osteochondral defect of femoral condyle 06/29/2015  . Plantar fasciitis of right foot 09/27/2014     Lanney Gins, PT, DPT 03/27/17 1:37 PM   [facility] [address]  [place], [place], [address] Phone: [phone]   Fax:  [phone]  Name: [name] [name] MRN: [id number] Date of Birth: 1966-07-28

## 2017-03-28 ENCOUNTER — Ambulatory Visit (INDEPENDENT_AMBULATORY_CARE_PROVIDER_SITE_OTHER): Payer: BLUE CROSS/BLUE SHIELD | Admitting: Orthopaedic Surgery

## 2017-03-31 ENCOUNTER — Ambulatory Visit: Payer: BLUE CROSS/BLUE SHIELD | Admitting: Physical Therapy

## 2017-03-31 DIAGNOSIS — R2689 Other abnormalities of gait and mobility: Secondary | ICD-10-CM

## 2017-03-31 DIAGNOSIS — M25562 Pain in left knee: Secondary | ICD-10-CM

## 2017-03-31 DIAGNOSIS — R262 Difficulty in walking, not elsewhere classified: Secondary | ICD-10-CM

## 2017-03-31 DIAGNOSIS — M6281 Muscle weakness (generalized): Secondary | ICD-10-CM

## 2017-03-31 DIAGNOSIS — M25662 Stiffness of left knee, not elsewhere classified: Secondary | ICD-10-CM

## 2017-03-31 NOTE — Therapy (Signed)
[facility] [address]  [place], [place], [address] Phone: [phone]   Fax:  [phone]  Physical Therapy Treatment  Patient Details  Name: Barbera Perritt MRN: [id number] Date of Birth: [birth year] Referring Provider: Dr. Durward Fortes  Encounter Date: 03/31/2017      PT End of Session - 03/31/17 0933    Visit Number 19   Number of Visits 24   Date for PT Re-Evaluation 04/18/17   PT Start Time 0930   PT Stop Time 1034   PT Time Calculation (min) 64 min   Activity Tolerance Patient tolerated treatment well   Behavior During Therapy [facility] for tasks assessed/performed      Past Medical History:  Diagnosis Date  . Anxiety   . Arthritis   . Bell's palsy 2016   left side   . Cancer ([place])    skin cancer- left temporal " sitting on bone"  . Depression   . Hypercholesteremia   . PONV (postoperative nausea and vomiting)    would like scope patch    Past Surgical History:  Procedure Laterality Date  . CHONDROPLASTY Left 06/29/2015   Procedure: CHONDROPLASTY;  Surgeon: Garald Balding, MD;  Location: [place];  Service: Orthopedics;  Laterality: Left;  . FOOT SURGERY Bilateral    Bone Spurs  . KNEE ARTHROSCOPY WITH DRILLING/MICROFRACTURE Left 06/29/2015   Procedure: KNEE ARTHROSCOPY WITH DRILLING/MICROFRACTURE;  Surgeon: Garald Balding, MD;  Location: [place];  Service: Orthopedics;  Laterality: Left;  . KNEE ARTHROSCOPY WITH LATERAL MENISECTOMY Left 06/29/2015   Procedure: KNEE ARTHROSCOPY WITH LATERAL MENISECTOMY;  Surgeon: Garald Balding, MD;  Location: [place];  Service: Orthopedics;  Laterality: Left;  . KNEE ARTHROSCOPY WITH LATERAL RELEASE Left 06/29/2015   Procedure: KNEE ARTHROSCOPY WITH LATERAL RELEASE;  Surgeon: Garald Balding, MD;  Location: [place];  Service: Orthopedics;  Laterality: Left;  . KNEE SURGERY    . Mohls Left    face   . PLANTAR FASCIA RELEASE Right 09/27/2014   Procedure: PLANTAR FASCIA RELEASE;  Surgeon: Garald Balding, MD;  Location: [place];  Service: Orthopedics;  Laterality: Right;  . TONSILLECTOMY    . TOTAL KNEE ARTHROPLASTY Left 01/21/2017   Procedure: TOTAL KNEE ARTHROPLASTY;  Surgeon: Garald Balding, MD;  Location: [place];  Service: Orthopedics;  Laterality: Left;  . Tummy tuck     2010    There were no vitals filed for this visit.      Subjective Assessment - 03/31/17 0932    Subjective Feels like today is a bad day - weather might be involved   Currently in Pain? Yes   Pain Score 8    Pain Location Knee   Pain Orientation Left   Pain Descriptors / Indicators Aching;Tightness   Pain Type Surgical pain            [facility] PT Assessment - 03/31/17 0001      AROM   AROM Assessment Site Knee   Right/Left Knee Right   Left Knee Extension 1   Left Knee Flexion 107                     [facility] Adult PT Treatment/Exercise - 03/31/17 0933      Knee/Hip Exercises: Aerobic   Elliptical L2 x 6 minutes     Knee/Hip Exercises: Machines for Strengthening   Cybex Knee Extension 15# L LE  eccentric x 20 reps   Cybex Knee Flexion 35# B LE x 15 reps     Knee/Hip Exercises: Standing   Forward Lunges Right;Left;20 reps   Forward Lunges Limitations 1 leg posterior on stool   Functional Squat 15 reps     Knee/Hip Exercises: Seated   Other Seated Knee/Hip Exercises L LE - Fitter - 1 blue/1 black x 20 reps     Knee/Hip Exercises: Supine   Quad Sets Left;5 reps   Quad Sets Limitations with heel propped on 1/2 FR prior to measurements   Bridges Limitations B LE - 5" hold x 15 reps   Straight Leg Raises Left;20 reps   Straight Leg Raises Limitations 4#     Modalities   Modalities Vasopneumatic     Vasopneumatic   Number Minutes Vasopneumatic  15 minutes   Vasopnuematic Location  Knee   Vasopneumatic Pressure High   Vasopneumatic Temperature  coldest  temp                  PT Short Term Goals - 03/13/17 1738      PT SHORT TERM GOAL #1   Title pt to be independent with HEP (03/05/17)   Status Achieved     PT SHORT TERM GOAL #2   Title pt to improve L knee ROM to 0-105 (03/05/17)   Status Partially Met  3.22.18: L knee PROM 1-107 dg           PT Long Term Goals - 03/13/17 1632      PT LONG TERM GOAL #1   Title patient to be independent with advanced HEP (03/25/17)   Status On-going     PT LONG TERM GOAL #2   Title Patient to improve L knee ROM to 0-120 (03/25/17)   Status On-going  3.22.18: L knee PROM 1-107 dg      PT LONG TERM GOAL #3   Title patient to demonstrate appropriate gait mechanics with good heel toe gait pattern with no AD (03/25/17)   Status On-going  3.22.18: Pt. still with decreased stance time on L, and occasinal forward flexed      PT LONG TERM GOAL #4   Title patient to demonstrate no extensor lag with SLR (03/25/17)   Status Achieved  3.22.18: Pt. without extensor lag however still slow to initiated movement      PT LONG TERM GOAL #5   Title patient to demonstrate ability to ascend/descend 1 flight of stairs reciprocally (03/25/17)   Status On-going  3.22.18: Pt. able to ascend/descend stairs with reciprocal pattern however requiring heavy rail use and SPC.  Visible quad instability noted with eccentric step down.                 Plan - 03/31/17 0933    Clinical Impression Statement Patient doing well today - continued reports of high pain levels. Patient continuing to present with gait deficits with heavy reinforcement of good gait mechanics and exercises tailored towards improving this. Education to begin a walking program for gait carryover within the home and community environment. Will continue to benefit from PT to [facility] functional mobility.    PT Treatment/Interventions ADLs/Self Care Home Management;Cryotherapy;Electrical Stimulation;Moist Heat;Ultrasound;Neuromuscular  re-education;Balance training;Therapeutic exercise;Therapeutic activities;Functional mobility training;Stair training;Gait training;Patient/family education;Manual techniques;Passive range of motion;Vasopneumatic Device;Taping   Consulted and Agree with Plan of Care Patient      Patient will benefit from skilled therapeutic intervention in order to improve the following deficits and impairments:  Abnormal gait, Decreased  activity tolerance, Decreased balance, Decreased range of motion, Decreased mobility, Decreased strength, Difficulty walking, Increased edema, Pain  Visit Diagnosis: Acute pain of left knee  Stiffness of left knee, not elsewhere classified  Difficulty in walking, not elsewhere classified  Other abnormalities of gait and mobility  Muscle weakness (generalized)     Problem List Patient Active Problem List   Diagnosis Date Noted  . Unilateral primary osteoarthritis, left knee 01/21/2017  . S/P total knee replacement using cement, left 01/21/2017  . Chondromalacia patellae, left knee 01/02/2017  . Torn meniscus 06/29/2015  . Osteochondral defect of femoral condyle 06/29/2015  . Plantar fasciitis of right foot 09/27/2014    Lanney Gins, PT, DPT 03/31/17 1:16 PM   [facility] [address]  [place], [place], [address] Phone: [phone]   Fax:  [phone]  Name: Lida Berkery MRN: [id number] Date of Birth: 06/01/1966

## 2017-04-03 ENCOUNTER — Ambulatory Visit: Payer: BLUE CROSS/BLUE SHIELD | Admitting: Physical Therapy

## 2017-04-03 DIAGNOSIS — R2689 Other abnormalities of gait and mobility: Secondary | ICD-10-CM

## 2017-04-03 DIAGNOSIS — R262 Difficulty in walking, not elsewhere classified: Secondary | ICD-10-CM

## 2017-04-03 DIAGNOSIS — M25662 Stiffness of left knee, not elsewhere classified: Secondary | ICD-10-CM

## 2017-04-03 DIAGNOSIS — M25562 Pain in left knee: Secondary | ICD-10-CM | POA: Diagnosis not present

## 2017-04-03 DIAGNOSIS — M6281 Muscle weakness (generalized): Secondary | ICD-10-CM

## 2017-04-03 NOTE — Therapy (Signed)
[facility] [address]  [place], [place], [address] Phone: [phone]   Fax:  [phone]  Physical Therapy Treatment  Patient Details  Name: [name] [name] MRN: [id number] Date of Birth: 1966/09/23 Referring Provider: Dr. Durward Fortes  Encounter Date: 04/03/2017      PT End of Session - 04/03/17 1159    Visit Number 20   Number of Visits 24   Date for PT Re-Evaluation 04/18/17   PT Start Time 0930   PT Stop Time 1032   PT Time Calculation (min) 62 min   Activity Tolerance Patient tolerated treatment well   Behavior During Therapy [facility] for tasks assessed/performed      Past Medical History:  Diagnosis Date  . Anxiety   . Arthritis   . Bell's palsy 2016   left side   . 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 Depression   . Hypercholesteremia   . PONV (postoperative nausea and vomiting)    would like scope patch    Past Surgical History:  Procedure Laterality Date  . CHONDROPLASTY Left 06/29/2015   Procedure: CHONDROPLASTY;  Surgeon: Garald Balding, MD;  Location: [place];  Service: Orthopedics;  Laterality: Left;  . FOOT SURGERY Bilateral    Bone Spurs  . KNEE ARTHROSCOPY WITH DRILLING/MICROFRACTURE Left 06/29/2015   Procedure: KNEE ARTHROSCOPY WITH DRILLING/MICROFRACTURE;  Surgeon: Garald Balding, MD;  Location: [place];  Service: Orthopedics;  Laterality: Left;  . KNEE ARTHROSCOPY WITH LATERAL MENISECTOMY Left 06/29/2015   Procedure: KNEE ARTHROSCOPY WITH LATERAL MENISECTOMY;  Surgeon: Garald Balding, MD;  Location: [place];  Service: Orthopedics;  Laterality: Left;  . KNEE ARTHROSCOPY WITH LATERAL RELEASE Left 06/29/2015   Procedure: KNEE ARTHROSCOPY WITH LATERAL RELEASE;  Surgeon: Garald Balding, MD;  Location: [place];  Service: Orthopedics;  Laterality: Left;  . KNEE SURGERY    . Mohls Left    face   . PLANTAR FASCIA RELEASE Right 09/27/2014   Procedure: PLANTAR FASCIA RELEASE;  Surgeon: Garald Balding, MD;  Location: [place];  Service: Orthopedics;  Laterality: Right;  . TONSILLECTOMY    . TOTAL KNEE ARTHROPLASTY Left 01/21/2017   Procedure: TOTAL KNEE ARTHROPLASTY;  Surgeon: Garald Balding, MD;  Location: [place];  Service: Orthopedics;  Laterality: Left;  . Tummy tuck     2010    There were no vitals filed for this visit.      Subjective Assessment - 04/03/17 1150    Subjective Continues to complain of increased pain and swelling - goes to MD tomorrow   Currently in Pain? Yes   Pain Score 7    Pain Location Knee   Pain Orientation Left   Pain Descriptors / Indicators Aching;Tightness;Constant   Pain Type Surgical pain            [facility] PT Assessment - 04/03/17 1158      AROM   AROM Assessment Site Knee   Right/Left Knee Right   Left Knee Extension 1   Left Knee Flexion 105                     [facility] Adult PT Treatment/Exercise - 04/03/17 0941      Knee/Hip Exercises: Aerobic   Elliptical Level 2.5 x 6 minutes      Knee/Hip Exercises: Machines for Strengthening   Cybex Knee Extension  20# L LE eccentric x 15 reps   Cybex Knee Flexion 45# B LE x 15 reps   Cybex Leg Press 45# B LE x 15      Knee/Hip Exercises: Standing   Wall Squat 15 reps   Wall Squat Limitations with ball squeeze   Lunge Walking - Round Trips each way 30 feet    Other Standing Knee Exercises step onto and over/bacward - 4" step x 15 reps     Modalities   Modalities Vasopneumatic     Vasopneumatic   Number Minutes Vasopneumatic  15 minutes   Vasopnuematic Location  Knee   Vasopneumatic Pressure High   Vasopneumatic Temperature  coldest temp                  PT Short Term Goals - 03/13/17 1738      PT SHORT TERM GOAL #1   Title pt to be independent with HEP (03/05/17)   Status Achieved     PT SHORT TERM GOAL #2   Title pt to improve L  knee ROM to 0-105 (03/05/17)   Status Partially Met  3.22.18: L knee PROM 1-107 dg           PT Long Term Goals - 03/13/17 1632      PT LONG TERM GOAL #1   Title patient to be independent with advanced HEP (03/25/17)   Status On-going     PT LONG TERM GOAL #2   Title Patient to improve L knee ROM to 0-120 (03/25/17)   Status On-going  3.22.18: L knee PROM 1-107 dg      PT LONG TERM GOAL #3   Title patient to demonstrate appropriate gait mechanics with good heel toe gait pattern with no AD (03/25/17)   Status On-going  3.22.18: Pt. still with decreased stance time on L, and occasinal forward flexed      PT LONG TERM GOAL #4   Title patient to demonstrate no extensor lag with SLR (03/25/17)   Status Achieved  3.22.18: Pt. without extensor lag however still slow to initiated movement      PT LONG TERM GOAL #5   Title patient to demonstrate ability to ascend/descend 1 flight of stairs reciprocally (03/25/17)   Status On-going  3.22.18: Pt. able to ascend/descend stairs with reciprocal pattern however requiring heavy rail use and SPC.  Visible quad instability noted with eccentric step down.                 Plan - 04/03/17 1200    Clinical Impression Statement [name] [name] doing well today - does continue to report high levels of pain daily as well as general swelling. Patient doing better with gait, however, does continue to present with gait deviations with hip hiking and general circumduction of L LE. Patient doing well with strengthening task but does require increased motivation and VC to remain on task and complete task properly. Has 4 visits left in plan of care - will plan to progress patient towards independent HEP practice.    PT Treatment/Interventions ADLs/Self Care Home Management;Cryotherapy;Electrical Stimulation;Moist Heat;Ultrasound;Neuromuscular re-education;Balance training;Therapeutic exercise;Therapeutic activities;Functional mobility training;Stair training;Gait  training;Patient/family education;Manual techniques;Passive range of motion;Vasopneumatic Device;Taping   Consulted and Agree with Plan of Care Patient      Patient will benefit from skilled therapeutic intervention in order to improve the following deficits and impairments:  Abnormal gait, Decreased activity tolerance, Decreased balance, Decreased range of motion, Decreased mobility, Decreased strength, Difficulty walking, Increased edema, Pain  Visit  Diagnosis: Acute pain of left knee  Stiffness of left knee, not elsewhere classified  Difficulty in walking, not elsewhere classified  Other abnormalities of gait and mobility  Muscle weakness (generalized)     Problem List Patient Active Problem List   Diagnosis Date Noted  . Unilateral primary osteoarthritis, left knee 01/21/2017  . S/P total knee replacement using cement, left 01/21/2017  . Chondromalacia patellae, left knee 01/02/2017  . Torn meniscus 06/29/2015  . Osteochondral defect of femoral condyle 06/29/2015  . Plantar fasciitis of right foot 09/27/2014    Lanney Gins, PT, DPT 04/03/17 12:04 PM   [facility] [address]  [address] [place], [place], [address] Phone: [phone]   Fax:  [phone]  Name: [name] [name] MRN: [id number] Date of Birth: Jan 26, 1966

## 2017-04-04 ENCOUNTER — Ambulatory Visit (INDEPENDENT_AMBULATORY_CARE_PROVIDER_SITE_OTHER): Payer: BLUE CROSS/BLUE SHIELD | Admitting: Orthopaedic Surgery

## 2017-04-04 ENCOUNTER — Encounter (INDEPENDENT_AMBULATORY_CARE_PROVIDER_SITE_OTHER): Payer: Self-pay | Admitting: Orthopaedic Surgery

## 2017-04-04 VITALS — BP 112/70 | HR 65 | Resp 12 | Ht 65.0 in | Wt 175.0 lb

## 2017-04-04 DIAGNOSIS — Z96652 Presence of left artificial knee joint: Secondary | ICD-10-CM

## 2017-04-04 NOTE — Progress Notes (Signed)
Office Visit Note   Patient: [name] [name]           Date of Birth: [birth year]           MRN: [id number] Visit Date: 04/04/2017              Requested by: Frazier Butt, MD [place], [place] [address] PCP: Frazier Butt, MD   Assessment & Plan: Visit Diagnoses:  1. History of left knee replacement   [name] [name] is still having some pain in her knee that I think is related to weakness. Her postoperative course has been slow but progressive. She's regained a lot of motion since her last office visit. I discussed being careful with weight gain and continuing her exercises. Have also discussed increased activity at the gym  Plan: Continue physical therapy for range of motion and strengthening exercises left knee. Limited use of Advil to no more than 800 mg 3 times a day with Tylenol in between. Office 6 weeks  Follow-Up Instructions: Return in about 6 weeks (around 05/16/2017).   Orders:  No orders of the defined types were placed in this encounter.  No orders of the defined types were placed in this encounter.     Procedures: No procedures performed   Clinical Data: No additional findings.   Subjective: Chief Complaint  Patient presents with  . Left Knee - Routine Post Op  . Knee Pain    TKR 01/20/17, PT extended, pain, swelling, difficulty walking, difficulty sleeping at night, Advil doesn't help   Approximately 2-1/2 months status post primary left total knee replacement. [name] [name] is going to physical therapy at [facility] in [facility] and progressing slowly. She still uses a cane. She still having some pain but no fever or chills. He oftentimes feels like her knee is unstable. HPI  Review of Systems   Objective: Vital Signs: BP 112/70 (BP Location: Right Arm, Patient Position: Sitting, Cuff Size: Normal)   Pulse 65   Resp 12   Ht 5\' 5"  (1.651 m)   Wt 175 lb (79.4 kg)   LMP 01/23/2015 Comment: perimenopausal (very irregular), no chance  pregnant  BMI 29.12 kg/m   Physical Exam  Ortho Exam left knee exam with excellent healing of the incision. No effusion. No opening with a varus or valgus stress. Full extension and 105 of flexion. No calf pain. No distal edema. Neurovascular exam intact. Knee is not warm.  Specialty Comments:  No specialty comments available.  Imaging: No results found.   PMFS History: Patient Active Problem List   Diagnosis Date Noted  . Unilateral primary osteoarthritis, left knee 01/21/2017  . S/P total knee replacement using cement, left 01/21/2017  . Chondromalacia patellae, left knee 01/02/2017  . Torn meniscus 06/29/2015  . Osteochondral defect of femoral condyle 06/29/2015  . Plantar fasciitis of right foot 09/27/2014   Past Medical History:  Diagnosis Date  . Anxiety   . Arthritis   . Bell's palsy 2016   left side   . Cancer ([place])    skin cancer- left temporal " sitting on bone"  . Depression   . Hypercholesteremia   . PONV (postoperative nausea and vomiting)    would like scope patch    History reviewed. No pertinent family history.  Past Surgical History:  Procedure Laterality Date  . CHONDROPLASTY Left 06/29/2015   Procedure: CHONDROPLASTY;  Surgeon: Garald Balding, MD;  Location: [place];  Service: Orthopedics;  Laterality: Left;  .  FOOT SURGERY Bilateral    Bone Spurs  . KNEE ARTHROSCOPY WITH DRILLING/MICROFRACTURE Left 06/29/2015   Procedure: KNEE ARTHROSCOPY WITH DRILLING/MICROFRACTURE;  Surgeon: Garald Balding, MD;  Location: [place];  Service: Orthopedics;  Laterality: Left;  . KNEE ARTHROSCOPY WITH LATERAL MENISECTOMY Left 06/29/2015   Procedure: KNEE ARTHROSCOPY WITH LATERAL MENISECTOMY;  Surgeon: Garald Balding, MD;  Location: [place];  Service: Orthopedics;  Laterality: Left;  . KNEE ARTHROSCOPY WITH LATERAL RELEASE Left 06/29/2015   Procedure: KNEE ARTHROSCOPY WITH LATERAL RELEASE;  Surgeon: Garald Balding, MD;  Location: [place];  Service: Orthopedics;  Laterality: Left;  . KNEE SURGERY    . Mohls Left    face  . PLANTAR FASCIA RELEASE Right 09/27/2014   Procedure: PLANTAR FASCIA RELEASE;  Surgeon: Garald Balding, MD;  Location: [place];  Service: Orthopedics;  Laterality: Right;  . TONSILLECTOMY    . TOTAL KNEE ARTHROPLASTY Left 01/21/2017   Procedure: TOTAL KNEE ARTHROPLASTY;  Surgeon: Garald Balding, MD;  Location: [place];  Service: Orthopedics;  Laterality: Left;  . Tummy tuck     2010   Social History   Occupational History  . Not on file.   Social History Main Topics  . Smoking status: Never Smoker  . Smokeless tobacco: Never Used  . Alcohol use No  . Drug use: No  . Sexual activity: Not Currently

## 2017-04-07 ENCOUNTER — Ambulatory Visit: Payer: BLUE CROSS/BLUE SHIELD

## 2017-04-07 DIAGNOSIS — M25662 Stiffness of left knee, not elsewhere classified: Secondary | ICD-10-CM

## 2017-04-07 DIAGNOSIS — M25562 Pain in left knee: Secondary | ICD-10-CM

## 2017-04-07 DIAGNOSIS — M6281 Muscle weakness (generalized): Secondary | ICD-10-CM

## 2017-04-07 DIAGNOSIS — R262 Difficulty in walking, not elsewhere classified: Secondary | ICD-10-CM

## 2017-04-07 DIAGNOSIS — R2689 Other abnormalities of gait and mobility: Secondary | ICD-10-CM

## 2017-04-07 NOTE — Therapy (Signed)
[facility] [address]  [place], [place], [address] Phone: [phone]   Fax:  [phone]  Physical Therapy Treatment  Patient Details  Name: [name] [name] MRN: [id number] Date of Birth: [birth year] Referring Provider: Dr. Durward Fortes  Encounter Date: 04/07/2017      PT End of Session - 04/07/17 0937    Visit Number 21   Number of Visits 24   Date for PT Re-Evaluation 04/18/17   PT Start Time 0930   PT Stop Time 1030   PT Time Calculation (min) 60 min   Activity Tolerance Patient tolerated treatment well   Behavior During Therapy [facility] for tasks assessed/performed      Past Medical History:  Diagnosis Date  . Anxiety   . Arthritis   . Bell's palsy 2016   left side   . Cancer ([place])    skin cancer- left temporal " sitting on bone"  . Depression   . Hypercholesteremia   . PONV (postoperative nausea and vomiting)    would like scope patch    Past Surgical History:  Procedure Laterality Date  . CHONDROPLASTY Left 06/29/2015   Procedure: CHONDROPLASTY;  Surgeon: Garald Balding, MD;  Location: [place];  Service: Orthopedics;  Laterality: Left;  . FOOT SURGERY Bilateral    Bone Spurs  . KNEE ARTHROSCOPY WITH DRILLING/MICROFRACTURE Left 06/29/2015   Procedure: KNEE ARTHROSCOPY WITH DRILLING/MICROFRACTURE;  Surgeon: Garald Balding, MD;  Location: [place];  Service: Orthopedics;  Laterality: Left;  . KNEE ARTHROSCOPY WITH LATERAL MENISECTOMY Left 06/29/2015   Procedure: KNEE ARTHROSCOPY WITH LATERAL MENISECTOMY;  Surgeon: Garald Balding, MD;  Location: [place];  Service: Orthopedics;  Laterality: Left;  . KNEE ARTHROSCOPY WITH LATERAL RELEASE Left 06/29/2015   Procedure: KNEE ARTHROSCOPY WITH LATERAL RELEASE;  Surgeon: Garald Balding, MD;  Location: [place];  Service: Orthopedics;  Laterality: Left;  . KNEE SURGERY    . Mohls Left    face   . PLANTAR FASCIA RELEASE Right 09/27/2014   Procedure: PLANTAR FASCIA RELEASE;  Surgeon: Garald Balding, MD;  Location: [place];  Service: Orthopedics;  Laterality: Right;  . TONSILLECTOMY    . TOTAL KNEE ARTHROPLASTY Left 01/21/2017   Procedure: TOTAL KNEE ARTHROPLASTY;  Surgeon: Garald Balding, MD;  Location: [place];  Service: Orthopedics;  Laterality: Left;  . Tummy tuck     2010    There were no vitals filed for this visit.      Subjective Assessment - 04/07/17 0934    Subjective Pt. reporting she is low on energy today due to having friends funeral yesterday and not sleeping well.     Currently in Pain? Yes   Pain Score 7    Pain Location Knee   Pain Orientation Left   Pain Descriptors / Indicators Aching;Tightness;Constant   Pain Type Surgical pain   Pain Radiating Towards n/a   Pain Onset More than a month ago   Pain Frequency Constant   Aggravating Factors  prolonged standing, prolonged walking    Pain Relieving Factors ice, pain meds   Multiple Pain Sites No                         [facility] Adult PT Treatment/Exercise - 04/07/17 0955      Ambulation/Gait   Ambulation/Gait Yes   Ambulation/Gait Assistance 6: Modified independent (Device/Increase time)   Ambulation  Distance (Feet) 270 Feet   Assistive device None   Gait Pattern Step-through pattern;Decreased step length - right;Decreased stance time - left   Ambulation Surface Level;Indoor   Gait Comments Pt. still ambulating with "stiff" L LE decrease hip/knee flexion with swing phase and forward flexed posture; pt. strongly cued to walk with upright posture and increase hip/knee flexion; pt. instructed to practice this at home     Knee/Hip Exercises: Aerobic   Elliptical Level 2.5 x 6 minutes      Knee/Hip Exercises: Machines for Strengthening   Cybex Knee Extension 20# L LE eccentric x 15 reps  pt. unable to increase resistance with good form   Cybex Knee Flexion 45# B LE x  15 reps  pt. unable to increase resistance with good technique      Knee/Hip Exercises: Standing   Forward Lunges Right;Left;20 reps;2 sets  no UE support    Forward Lunges Limitations Walking lunge 2 x 30 ft; 2 sets      Knee/Hip Exercises: Supine   Straight Leg Raises Left;20 reps   Straight Leg Raises Limitations 4#   Other Supine Knee/Hip Exercises Supine bridge + HS curl with heels on peanut p-ball x 10 reps      Modalities   Modalities Cryotherapy     Cryotherapy   Number Minutes Cryotherapy 15 Minutes   Cryotherapy Location Knee  L   Type of Cryotherapy Ice pack                  PT Short Term Goals - 03/13/17 1738      PT SHORT TERM GOAL #1   Title pt to be independent with HEP (03/05/17)   Status Achieved     PT SHORT TERM GOAL #2   Title pt to improve L knee ROM to 0-105 (03/05/17)   Status Partially Met  3.22.18: L knee PROM 1-107 dg           PT Long Term Goals - 03/13/17 1632      PT LONG TERM GOAL #1   Title patient to be independent with advanced HEP (03/25/17)   Status On-going     PT LONG TERM GOAL #2   Title Patient to improve L knee ROM to 0-120 (03/25/17)   Status On-going  3.22.18: L knee PROM 1-107 dg      PT LONG TERM GOAL #3   Title patient to demonstrate appropriate gait mechanics with good heel toe gait pattern with no AD (03/25/17)   Status On-going  3.22.18: Pt. still with decreased stance time on L, and occasinal forward flexed      PT LONG TERM GOAL #4   Title patient to demonstrate no extensor lag with SLR (03/25/17)   Status Achieved  3.22.18: Pt. without extensor lag however still slow to initiated movement      PT LONG TERM GOAL #5   Title patient to demonstrate ability to ascend/descend 1 flight of stairs reciprocally (03/25/17)   Status On-going  3.22.18: Pt. able to ascend/descend stairs with reciprocal pattern however requiring heavy rail use and SPC.  Visible quad instability noted with eccentric step down.                  Plan - 04/07/17 0950    Clinical Impression Statement Pt. reports she is tired today due to getting poor sleep last night due to being awakened by knee pain.  Pt. demo'd poor attention with therapy today requiring constant cueing to stay  on task.  Increased time required for all activities today due to pt. distractibility, and apprehension with standing tasks.  Pt. able to perform walking lunge x 30 ft with good technique today however still instable at L knee.  Pt. reports future plans to attend water aerobics however is currently not sure where she will go for this due to financial concerns.  Pt. with 3 more visits in POC.  Will plan to continue to review post d/c gym program and adjust HEP for transition from therapy.     PT Treatment/Interventions ADLs/Self Care Home Management;Cryotherapy;Electrical Stimulation;Moist Heat;Ultrasound;Neuromuscular re-education;Balance training;Therapeutic exercise;Therapeutic activities;Functional mobility training;Stair training;Gait training;Patient/family education;Manual techniques;Passive range of motion;Vasopneumatic Device;Taping   PT Next Visit Plan Prepare for transition to home program and d/c       Patient will benefit from skilled therapeutic intervention in order to improve the following deficits and impairments:  Abnormal gait, Decreased activity tolerance, Decreased balance, Decreased range of motion, Decreased mobility, Decreased strength, Difficulty walking, Increased edema, Pain  Visit Diagnosis: Acute pain of left knee  Stiffness of left knee, not elsewhere classified  Difficulty in walking, not elsewhere classified  Other abnormalities of gait and mobility  Muscle weakness (generalized)     Problem List Patient Active Problem List   Diagnosis Date Noted  . Unilateral primary osteoarthritis, left knee 01/21/2017  . S/P total knee replacement using cement, left 01/21/2017  . Chondromalacia patellae, left knee  01/02/2017  . Torn meniscus 06/29/2015  . Osteochondral defect of femoral condyle 06/29/2015  . Plantar fasciitis of right foot 09/27/2014    Bess Harvest, PTA 04/07/17 5:47 PM  [facility] [address]  [place], [place], [address] Phone: [phone]   Fax:  [phone]  Name: [name] [name] MRN: [id number] Date of Birth: [birth year]

## 2017-04-10 ENCOUNTER — Ambulatory Visit: Payer: BLUE CROSS/BLUE SHIELD

## 2017-04-10 ENCOUNTER — Other Ambulatory Visit (HOSPITAL_BASED_OUTPATIENT_CLINIC_OR_DEPARTMENT_OTHER): Payer: Self-pay | Admitting: Obstetrics and Gynecology

## 2017-04-10 DIAGNOSIS — Z1231 Encounter for screening mammogram for malignant neoplasm of breast: Secondary | ICD-10-CM

## 2017-04-10 DIAGNOSIS — R2689 Other abnormalities of gait and mobility: Secondary | ICD-10-CM

## 2017-04-10 DIAGNOSIS — M25562 Pain in left knee: Secondary | ICD-10-CM | POA: Diagnosis not present

## 2017-04-10 DIAGNOSIS — M25662 Stiffness of left knee, not elsewhere classified: Secondary | ICD-10-CM

## 2017-04-10 DIAGNOSIS — R262 Difficulty in walking, not elsewhere classified: Secondary | ICD-10-CM

## 2017-04-10 DIAGNOSIS — M6281 Muscle weakness (generalized): Secondary | ICD-10-CM

## 2017-04-10 NOTE — Therapy (Signed)
[facility] [address]  [place], [place], [address] Phone: [phone]   Fax:  [phone]  Physical Therapy Treatment  Patient Details  Name: [name] [name] MRN: [id number] Date of Birth: May 13, 1966 Referring Provider: Dr. Durward Fortes  Encounter Date: 04/10/2017      PT End of Session - 04/10/17 0935    Visit Number 22   Number of Visits 24   Date for PT Re-Evaluation 04/18/17   PT Start Time 0931   PT Stop Time 1035   PT Time Calculation (min) 64 min   Activity Tolerance Patient tolerated treatment well   Behavior During Therapy [facility] for tasks assessed/performed      Past Medical History:  Diagnosis Date  . Anxiety   . Arthritis   . Bell's palsy 2016   left side   . Cancer ([place])    skin cancer- left temporal " sitting on bone"  . Depression   . Hypercholesteremia   . PONV (postoperative nausea and vomiting)    would like scope patch    Past Surgical History:  Procedure Laterality Date  . CHONDROPLASTY Left 06/29/2015   Procedure: CHONDROPLASTY;  Surgeon: Garald Balding, MD;  Location: [place];  Service: Orthopedics;  Laterality: Left;  . FOOT SURGERY Bilateral    Bone Spurs  . KNEE ARTHROSCOPY WITH DRILLING/MICROFRACTURE Left 06/29/2015   Procedure: KNEE ARTHROSCOPY WITH DRILLING/MICROFRACTURE;  Surgeon: Garald Balding, MD;  Location: [place];  Service: Orthopedics;  Laterality: Left;  . KNEE ARTHROSCOPY WITH LATERAL MENISECTOMY Left 06/29/2015   Procedure: KNEE ARTHROSCOPY WITH LATERAL MENISECTOMY;  Surgeon: Garald Balding, MD;  Location: [place];  Service: Orthopedics;  Laterality: Left;  . KNEE ARTHROSCOPY WITH LATERAL RELEASE Left 06/29/2015   Procedure: KNEE ARTHROSCOPY WITH LATERAL RELEASE;  Surgeon: Garald Balding, MD;  Location: [place];  Service: Orthopedics;  Laterality: Left;  . KNEE SURGERY    . Mohls Left    face   . PLANTAR FASCIA RELEASE Right 09/27/2014   Procedure: PLANTAR FASCIA RELEASE;  Surgeon: Garald Balding, MD;  Location: [place];  Service: Orthopedics;  Laterality: Right;  . TONSILLECTOMY    . TOTAL KNEE ARTHROPLASTY Left 01/21/2017   Procedure: TOTAL KNEE ARTHROPLASTY;  Surgeon: Garald Balding, MD;  Location: [place];  Service: Orthopedics;  Laterality: Left;  . Tummy tuck     2010    There were no vitals filed for this visit.      Subjective Assessment - 04/10/17 0934    Subjective Pt. reporting she stood and stooped down spraying for weeds for 2 hours yesterday.  Pt. reporting she feels her knee has had increased pain and some "popping" over lateral knee since working in the yard yesterday.     Currently in Pain? Yes   Pain Score 8    Pain Orientation Left   Pain Descriptors / Indicators Aching;Tightness;Constant   Pain Type Surgical pain   Multiple Pain Sites No                         [facility] Adult PT Treatment/Exercise - 04/10/17 0944      Ambulation/Gait   Ambulation/Gait Yes   Ambulation/Gait Assistance 6: Modified independent (Device/Increase time)   Ambulation Distance (Feet) 270 Feet   Assistive device None   Gait Pattern Step-through pattern;Decreased step length - right;Decreased stance time - left  Ambulation Surface Level;Indoor   Gait Comments Cues for even wt. shift, even stance time, increased L hip/knee flexion     Knee/Hip Exercises: Stretches   Passenger transport manager Limitations Prone with strap     Knee/Hip Exercises: Aerobic   Stationary Bike L3 x 6 minutes     Knee/Hip Exercises: Machines for Strengthening   Cybex Knee Extension 20# B LE's x 20 reps; 20# x 15 reps B LE con/L ecc   Cybex Knee Flexion 35# B LE x 15 reps     Knee/Hip Exercises: Standing   Forward Step Up Left;20 reps;Hand Hold: 1   Forward Step Up Limitations 8" step    Step Down Left;15 reps;Hand Hold: 1;Step  Height: 4"   Step Down Limitations cues for upright posture; mirror feedback    Functional Squat 15 reps   Functional Squat Limitations on TRX   cues for even wt. shift      Vasopneumatic   Number Minutes Vasopneumatic  15 minutes   Vasopnuematic Location  Knee   Vasopneumatic Pressure High   Vasopneumatic Temperature  coldest temp     Manual Therapy   Manual Therapy Soft tissue mobilization   Soft tissue mobilization STM with roller stick to L lateral HS insertion posterior/lateral knee; pt. reporting tightness here                  PT Short Term Goals - 03/13/17 1738      PT SHORT TERM GOAL #1   Title pt to be independent with HEP (03/05/17)   Status Achieved     PT SHORT TERM GOAL #2   Title pt to improve L knee ROM to 0-105 (03/05/17)   Status Partially Met  3.22.18: L knee PROM 1-107 dg           PT Long Term Goals - 03/13/17 1632      PT LONG TERM GOAL #1   Title patient to be independent with advanced HEP (03/25/17)   Status On-going     PT LONG TERM GOAL #2   Title Patient to improve L knee ROM to 0-120 (03/25/17)   Status On-going  3.22.18: L knee PROM 1-107 dg      PT LONG TERM GOAL #3   Title patient to demonstrate appropriate gait mechanics with good heel toe gait pattern with no AD (03/25/17)   Status On-going  3.22.18: Pt. still with decreased stance time on L, and occasinal forward flexed      PT LONG TERM GOAL #4   Title patient to demonstrate no extensor lag with SLR (03/25/17)   Status Achieved  3.22.18: Pt. without extensor lag however still slow to initiated movement      PT LONG TERM GOAL #5   Title patient to demonstrate ability to ascend/descend 1 flight of stairs reciprocally (03/25/17)   Status On-going  3.22.18: Pt. able to ascend/descend stairs with reciprocal pattern however requiring heavy rail use and SPC.  Visible quad instability noted with eccentric step down.                 Plan - 04/10/17 0936    Clinical  Impression Statement Pt. reporting she sprayed for weeds in her yard for 2 hrs yesterday and has felt increased pain and, "popping" at lateral posterior knee with walking since then.  STM/strumming with roller stick to posterior/lateral HS area today with good relief following this.  Pt. instructed to take occasional sitting  rest breaks with yard work to avoid overuse in future.  Today's treatment focusing on stepping/squatting activity with good wt. shift and technique for use of these activities at gym post-d/c.  Still some cueing required for upright posture with eccentric strengthening activities with visible L quad instability.  Ice/compression to end treatment to reduce post exercise swelling and pain.     PT Treatment/Interventions ADLs/Self Care Home Management;Cryotherapy;Electrical Stimulation;Moist Heat;Ultrasound;Neuromuscular re-education;Balance training;Therapeutic exercise;Therapeutic activities;Functional mobility training;Stair training;Gait training;Patient/family education;Manual techniques;Passive range of motion;Vasopneumatic Device;Taping   PT Next Visit Plan Add to home program/gym program; continue work on eccentric quad control       Patient will benefit from skilled therapeutic intervention in order to improve the following deficits and impairments:  Abnormal gait, Decreased activity tolerance, Decreased balance, Decreased range of motion, Decreased mobility, Decreased strength, Difficulty walking, Increased edema, Pain  Visit Diagnosis: Acute pain of left knee  Stiffness of left knee, not elsewhere classified  Difficulty in walking, not elsewhere classified  Other abnormalities of gait and mobility  Muscle weakness (generalized)     Problem List Patient Active Problem List   Diagnosis Date Noted  . Unilateral primary osteoarthritis, left knee 01/21/2017  . S/P total knee replacement using cement, left 01/21/2017  . Chondromalacia patellae, left knee 01/02/2017   . Torn meniscus 06/29/2015  . Osteochondral defect of femoral condyle 06/29/2015  . Plantar fasciitis of right foot 09/27/2014    Bess Harvest, PTA 04/10/17 10:43 AM  [facility] [address]  [place], [place], [address] Phone: [phone]   Fax:  [phone]  Name: [name] [name] MRN: [id number] Date of Birth: 03-Sep-1966

## 2017-04-14 ENCOUNTER — Ambulatory Visit: Payer: BLUE CROSS/BLUE SHIELD

## 2017-04-14 DIAGNOSIS — M25562 Pain in left knee: Secondary | ICD-10-CM

## 2017-04-14 DIAGNOSIS — R2689 Other abnormalities of gait and mobility: Secondary | ICD-10-CM

## 2017-04-14 DIAGNOSIS — R262 Difficulty in walking, not elsewhere classified: Secondary | ICD-10-CM

## 2017-04-14 DIAGNOSIS — M6281 Muscle weakness (generalized): Secondary | ICD-10-CM

## 2017-04-14 DIAGNOSIS — M25662 Stiffness of left knee, not elsewhere classified: Secondary | ICD-10-CM

## 2017-04-14 NOTE — Therapy (Signed)
[facility] [place] [address]  [address] [place], [place], [address] Phone: [phone]   Fax:  [phone]  Physical Therapy Treatment  Patient Details  Name: [name] [name] MRN: [id number] Date of Birth: 08/22/1966 Referring Provider: Dr. Whitfield  Encounter Date: 04/14/2017      PT End of Session - 04/14/17 0938    Visit Number 23   Number of Visits 24   Date for PT Re-Evaluation 04/18/17   PT Start Time 0931   PT Stop Time 1028   PT Time Calculation (min) 57 min   Activity Tolerance Patient tolerated treatment well   Behavior During Therapy WFL for tasks assessed/performed      Past Medical History:  Diagnosis Date  . Anxiety   . Arthritis   . Bell's palsy 2016   left side   . Cancer (HCC)    skin cancer- left temporal " sitting on bone"  . Depression   . Hypercholesteremia   . PONV (postoperative nausea and vomiting)    would like scope patch    Past Surgical History:  Procedure Laterality Date  . CHONDROPLASTY Left 06/29/2015   Procedure: CHONDROPLASTY;  Surgeon: Peter W Whitfield, MD;  Location: [facility];  Service: Orthopedics;  Laterality: Left;  . FOOT SURGERY Bilateral    Bone Spurs  . KNEE ARTHROSCOPY WITH DRILLING/MICROFRACTURE Left 06/29/2015   Procedure: KNEE ARTHROSCOPY WITH DRILLING/MICROFRACTURE;  Surgeon: Peter W Whitfield, MD;  Location: [facility];  Service: Orthopedics;  Laterality: Left;  . KNEE ARTHROSCOPY WITH LATERAL MENISECTOMY Left 06/29/2015   Procedure: KNEE ARTHROSCOPY WITH LATERAL MENISECTOMY;  Surgeon: Peter W Whitfield, MD;  Location: [facility];  Service: Orthopedics;  Laterality: Left;  . KNEE ARTHROSCOPY WITH LATERAL RELEASE Left 06/29/2015   Procedure: KNEE ARTHROSCOPY WITH LATERAL RELEASE;  Surgeon: Peter W Whitfield, MD;  Location: [facility];  Service: Orthopedics;  Laterality: Left;  . KNEE SURGERY    . Mohls Left    face   . PLANTAR FASCIA RELEASE Right 09/27/2014   Procedure: PLANTAR FASCIA RELEASE;  Surgeon: Peter W Whitfield, MD;  Location: [facility];  Service: Orthopedics;  Laterality: Right;  . TONSILLECTOMY    . TOTAL KNEE ARTHROPLASTY Left 01/21/2017   Procedure: TOTAL KNEE ARTHROPLASTY;  Surgeon: Peter W Whitfield, MD;  Location[facility];  Service: Orthopedics;  Laterality: Left;  . Tummy tuck     2010    There were no vitals filed for this visit.      Subjective Assessment - 04/14/17 0933    Subjective Pt. able walk around at farmers market for 30 min without AD or excessive pain.     Currently in Pain? Yes   Pain Score 6    Pain Location Knee   Pain Orientation Left   Pain Descriptors / Indicators Sharp   Pain Type Surgical pain   Pain Onset More than a month ago   Pain Frequency Intermittent   Aggravating Factors  prolonged    Pain Relieving Factors ice, pain meds   Multiple Pain Sites No                         [facility] Adult PT Treatment/Exercise - 04/14/17 0951      Ambulation/Gait   Ambulation/Gait Yes   Ambulation/Gait Assistance 6: Modified independent (Device/Increase time)   Ambulation Distance (Feet) 270 Feet   Assistive device None   Gait   Pattern Step-through pattern;Decreased step length - right;Decreased stance time - left   Ambulation Surface Level;Indoor   Stairs Yes   Stairs Assistance 6: Modified independent (Device/Increase time)   Stairs Assistance Details (indicate cue type and reason) 1 light rail use; ascending/descending stairs still with visible L quad instability however improved step-through mechanics and less rail use    Gait Comments Some cueing for even wt. shift and increased hip/knee flexion required however pt. demonstrating improved gait mechanics today with improved carryover in session     Knee/Hip Exercises: Stretches   Sports administrator Left;60 seconds;1 rep  added to [employer] Stretch Limitations Prone with strap      Knee/Hip Exercises: Machines for Strengthening   Cybex Knee Extension 20# B LE's x 20 reps; 20# x 20 reps B LE con/L ecc   Cybex Knee Flexion 45# B LE x 15 reps     Knee/Hip Exercises: Standing   Forward Lunges Right;Left;20 reps;1 set   Forward Lunges Limitations Walking lunge 2 x 30 ft   Lateral Step Up Right;Left;1 set;10 reps;Step Height: 6";Hand Hold: 0   Lateral Step Up Limitations No UE    Forward Step Up Left;10 reps;Hand Hold: 0   Forward Step Up Limitations 8" step    Step Down Left;15 reps;Hand Hold: 1;Step Height: 6"  added to HEP    Step Down Limitations cues for upright posture   Functional Squat 15 reps  added to HEP at counter    Functional Squat Limitations on TRX      Vasopneumatic   Number Minutes Vasopneumatic  15 minutes   Vasopnuematic Location  Knee   Vasopneumatic Pressure High   Vasopneumatic Temperature  coldest temp                PT Education - 04/14/17 1034    Education provided Yes   Education Details Prone quad stretch, eccentric step down, counter squat   Person(s) Educated Patient   Methods Explanation;Demonstration;Verbal cues;Handout;Tactile cues   Comprehension Verbalized understanding;Returned demonstration;Verbal cues required;Tactile cues required;Need further instruction          PT Short Term Goals - 03/13/17 1738      PT SHORT TERM GOAL #1   Title pt to be independent with HEP (03/05/17)   Status Achieved     PT SHORT TERM GOAL #2   Title pt to improve L knee ROM to 0-105 (03/05/17)   Status Partially Met  3.22.18: L knee PROM 1-107 dg           PT Long Term Goals - 03/13/17 1632      PT LONG TERM GOAL #1   Title patient to be independent with advanced HEP (03/25/17)   Status On-going     PT LONG TERM GOAL #2   Title Patient to improve L knee ROM to 0-120 (03/25/17)   Status On-going  3.22.18: L knee PROM 1-107 dg      PT LONG TERM GOAL #3   Title patient to demonstrate appropriate gait mechanics with good  heel toe gait pattern with no AD (03/25/17)   Status On-going  3.22.18: Pt. still with decreased stance time on L, and occasinal forward flexed      PT LONG TERM GOAL #4   Title patient to demonstrate no extensor lag with SLR (03/25/17)   Status Achieved  3.22.18: Pt. without extensor lag however still slow to initiated movement      PT LONG TERM GOAL #5  Title patient to demonstrate ability to ascend/descend 1 flight of stairs reciprocally (03/25/17)   Status On-going  3.22.18: Pt. able to ascend/descend stairs with reciprocal pattern however requiring heavy rail use and SPC.  Visible quad instability noted with eccentric step down.                 Plan - 04/14/17 0939    Clinical Impression Statement Reese doing well today demonstrating improved gait mechanics when initially seen walking into therapy.  Pt. able to continue improved gait mechanics in treatment with improved carryover today.  Improved step-through technique with less rail dependence while navigating stairs today.  Treatment focusing on review of post-therapy gym program and update to HEP.  Pt. with improved confidence with stepping activity today with better control.  Discussion with pt. regarding her ending POC and with 1 remaining visit in POC.  Pt. verbalized understanding and anticipates d/c next visit.      PT Treatment/Interventions ADLs/Self Care Home Management;Cryotherapy;Electrical Stimulation;Moist Heat;Ultrasound;Neuromuscular re-education;Balance training;Therapeutic exercise;Therapeutic activities;Functional mobility training;Stair training;Gait training;Patient/family education;Manual techniques;Passive range of motion;Vasopneumatic Device;Taping   PT Next Visit Plan Final review of post-d/c home and gym program; d/c       Patient will benefit from skilled therapeutic intervention in order to improve the following deficits and impairments:  Abnormal gait, Decreased activity tolerance, Decreased balance,  Decreased range of motion, Decreased mobility, Decreased strength, Difficulty walking, Increased edema, Pain  Visit Diagnosis: Acute pain of left knee  Stiffness of left knee, not elsewhere classified  Difficulty in walking, not elsewhere classified  Other abnormalities of gait and mobility  Muscle weakness (generalized)     Problem List Patient Active Problem List   Diagnosis Date Noted  . Unilateral primary osteoarthritis, left knee 01/21/2017  . S/P total knee replacement using cement, left 01/21/2017  . Chondromalacia patellae, left knee 01/02/2017  . Torn meniscus 06/29/2015  . Osteochondral defect of femoral condyle 06/29/2015  . Plantar fasciitis of right foot 09/27/2014    Micah Denny, PTA 04/14/17 1:03 PM  [facility] [place] [address]  [address] [place], [place], [address] Phone: [phone]   Fax:  [phone]  Name: Tashonna Aiken MRN: [id number] Date of Birth: 06/23/1966   

## 2017-04-15 ENCOUNTER — Encounter (HOSPITAL_BASED_OUTPATIENT_CLINIC_OR_DEPARTMENT_OTHER): Payer: Self-pay

## 2017-04-15 ENCOUNTER — Ambulatory Visit (HOSPITAL_BASED_OUTPATIENT_CLINIC_OR_DEPARTMENT_OTHER)
Admission: RE | Admit: 2017-04-15 | Discharge: 2017-04-15 | Disposition: A | Discharge status: Home / Self Care | Payer: BLUE CROSS/BLUE SHIELD | Source: Ambulatory Visit | Attending: Obstetrics and Gynecology | Admitting: Obstetrics and Gynecology

## 2017-04-15 DIAGNOSIS — Z1231 Encounter for screening mammogram for malignant neoplasm of breast: Secondary | ICD-10-CM | POA: Insufficient documentation

## 2017-04-17 ENCOUNTER — Ambulatory Visit: Payer: BLUE CROSS/BLUE SHIELD | Admitting: Physical Therapy

## 2017-04-17 DIAGNOSIS — R262 Difficulty in walking, not elsewhere classified: Secondary | ICD-10-CM

## 2017-04-17 DIAGNOSIS — M25662 Stiffness of left knee, not elsewhere classified: Secondary | ICD-10-CM

## 2017-04-17 DIAGNOSIS — M25562 Pain in left knee: Secondary | ICD-10-CM

## 2017-04-17 DIAGNOSIS — M6281 Muscle weakness (generalized): Secondary | ICD-10-CM

## 2017-04-17 DIAGNOSIS — R2689 Other abnormalities of gait and mobility: Secondary | ICD-10-CM

## 2017-04-17 NOTE — Therapy (Signed)
[facility] [place] [address]  [place], [place], [address] Phone: [phone]   Fax:  [phone]  Physical Therapy Treatment  Patient Details  Name: [name] [name] MRN: [id number] Date of Birth: 03/22/1966 Referring Provider: Dr. Durward Fortes  Encounter Date: 04/17/2017      PT End of Session - 04/17/17 1536    Visit Number 24   Number of Visits 24   Date for PT Re-Evaluation 04/18/17   PT Start Time 0934   PT Stop Time 1032   PT Time Calculation (min) 58 min   Activity Tolerance Patient tolerated treatment well   Behavior During Therapy [facility] for tasks assessed/performed      Past Medical History:  Diagnosis Date  . Anxiety   . Arthritis   . Bell's palsy 2016   left side   . 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 Depression   . Hypercholesteremia   . PONV (postoperative nausea and vomiting)    would like scope patch    Past Surgical History:  Procedure Laterality Date  . AUGMENTATION MAMMAPLASTY Bilateral    placed 14 years ago   . CHONDROPLASTY Left 06/29/2015   Procedure: CHONDROPLASTY;  Surgeon: Garald Balding, MD;  Location: [place];  Service: Orthopedics;  Laterality: Left;  . FOOT SURGERY Bilateral    Bone Spurs  . KNEE ARTHROSCOPY WITH DRILLING/MICROFRACTURE Left 06/29/2015   Procedure: KNEE ARTHROSCOPY WITH DRILLING/MICROFRACTURE;  Surgeon: Garald Balding, MD;  Location: [place];  Service: Orthopedics;  Laterality: Left;  . KNEE ARTHROSCOPY WITH LATERAL MENISECTOMY Left 06/29/2015   Procedure: KNEE ARTHROSCOPY WITH LATERAL MENISECTOMY;  Surgeon: Garald Balding, MD;  Location: [place];  Service: Orthopedics;  Laterality: Left;  . KNEE ARTHROSCOPY WITH LATERAL RELEASE Left 06/29/2015   Procedure: KNEE ARTHROSCOPY WITH LATERAL RELEASE;  Surgeon: Garald Balding, MD;  Location: [place];  Service:  Orthopedics;  Laterality: Left;  . KNEE SURGERY    . Mohls Left    face  . PLANTAR FASCIA RELEASE Right 09/27/2014   Procedure: PLANTAR FASCIA RELEASE;  Surgeon: Garald Balding, MD;  Location: [facility] [place];  Service: Orthopedics;  Laterality: Right;  . TONSILLECTOMY    . TOTAL KNEE ARTHROPLASTY Left 01/21/2017   Procedure: TOTAL KNEE ARTHROPLASTY;  Surgeon: Garald Balding, MD;  Location: [facility];  Service: Orthopedics;  Laterality: Left;  . Tummy tuck     2010    There were no vitals filed for this visit.      Subjective Assessment - 04/17/17 0935    Subjective Feels like she had shin splints - had to ice yesterday   Currently in Pain? Yes   Pain Score 7    Pain Location Knee   Pain Orientation Left   Pain Descriptors / Indicators Aching;Sharp            [facility] PT Assessment - 04/17/17 0001      AROM   AROM Assessment Site Knee   Right/Left Knee Left   Left Knee Extension 1   Left Knee Flexion 108                     [facility] Adult PT Treatment/Exercise - 04/17/17 0944      Knee/Hip Exercises: Aerobic   Elliptical L5 x 6 minutes     Knee/Hip Exercises: Machines for Strengthening  Cybex Knee Extension 20# R LE eccentric x 15   Cybex Knee Flexion 35# B LE x 10; 45# B LE x 15 reps     Knee/Hip Exercises: Standing   Step Down Left;15 reps;Hand Hold: 2;Step Height: 8"   Step Down Limitations eccentric   Functional Squat 15 reps   Functional Squat Limitations TRX   Lunge Walking - Round Trips 2 x 30 feet     Modalities   Modalities Vasopneumatic     Vasopneumatic   Number Minutes Vasopneumatic  15 minutes   Vasopnuematic Location  Knee   Vasopneumatic Pressure High   Vasopneumatic Temperature  coldest temp                  PT Short Term Goals - 03/13/17 1738      PT SHORT TERM GOAL #1   Title pt to be independent with HEP (03/05/17)   Status Achieved     PT SHORT TERM GOAL #2   Title pt to improve L knee ROM to 0-105  (03/05/17)   Status Partially Met  3.22.18: L knee PROM 1-107 dg           PT Long Term Goals - 04/17/17 1536      PT LONG TERM GOAL #1   Title patient to be independent with advanced HEP (03/25/17)   Status Achieved     PT LONG TERM GOAL #2   Title Patient to improve L knee ROM to 0-120 (03/25/17)   Status Partially Met     PT LONG TERM GOAL #3   Title patient to demonstrate appropriate gait mechanics with good heel toe gait pattern with no AD (03/25/17)   Status Partially Met     PT LONG TERM GOAL #4   Title patient to demonstrate no extensor lag with SLR (03/25/17)   Status Achieved     PT LONG TERM GOAL #5   Title patient to demonstrate ability to ascend/descend 1 flight of stairs reciprocally (03/25/17)   Status Achieved               Plan - 04/17/17 1537    Clinical Impression Statement Jonny today with continued reports of knee pain - however much improved gait pattern since beginning of treatment session. Mack with improved ROM as well as strength today, however will continue to beneift form gym program to maximize functional mobility and return of normal gait mechanics. Review of established EHP today with good carryover - patient requesting write out of all exercises for her to do at gym independently with PT doing so. All goals met or partially met at this time with main limitations continue to be with gait mechanics and general ROM. Patient to be discharged from PT on this date with aptient welcome to return to PT in the future for any other needs.    PT Treatment/Interventions ADLs/Self Care Home Management;Cryotherapy;Electrical Stimulation;Moist Heat;Ultrasound;Neuromuscular re-education;Balance training;Therapeutic exercise;Therapeutic activities;Functional mobility training;Stair training;Gait training;Patient/family education;Manual techniques;Passive range of motion;Vasopneumatic Device;Taping   PT Next Visit Plan d/c today   Consulted and Agree with Plan of Care  Patient      Patient will benefit from skilled therapeutic intervention in order to improve the following deficits and impairments:  Abnormal gait, Decreased activity tolerance, Decreased balance, Decreased range of motion, Decreased mobility, Decreased strength, Difficulty walking, Increased edema, Pain  Visit Diagnosis: Acute pain of left knee  Stiffness of left knee, not elsewhere classified  Difficulty in walking, not elsewhere classified  Other abnormalities  of gait and mobility  Muscle weakness (generalized)     Problem List Patient Active Problem List   Diagnosis Date Noted  . Unilateral primary osteoarthritis, left knee 01/21/2017  . S/P total knee replacement using cement, left 01/21/2017  . Chondromalacia patellae, left knee 01/02/2017  . Torn meniscus 06/29/2015  . Osteochondral defect of femoral condyle 06/29/2015  . Plantar fasciitis of right foot 09/27/2014     Lanney Gins, PT, DPT 04/17/17 3:47 PM   PHYSICAL THERAPY DISCHARGE SUMMARY  Visits from Start of Care: 24  Current functional level related to goals / functional outcomes: See above   Remaining deficits: See above   Education / Equipment: HEP  Plan: Patient agrees to discharge.  Patient goals were partially met. Patient is being discharged due to being pleased with the current functional level.  ?????      Lanney Gins, PT, DPT 04/17/17 3:48 PM   [facility] [address]  [place], [place], [address] Phone: [phone]   Fax:  [phone]  Name: [name] [name] MRN: [id number] Date of Birth: 1966/10/25

## 2017-04-17 NOTE — Patient Instructions (Signed)
Gym program  1. Elliptical - Level 5 - cardio warm-up; treadmill - fast walking   2. Knee extension - 2 up/1 down - 20#  3. Hamstring curls - 45#  4. Step downs - L leg on step - slowly lower R heel to ground  5. Squats  6. Lunges - walking or static  7. Straight leg raise - add ankle weights  8. Single leg balance - firm surface, progress to foam   9. Bridge - progress to single leg bridge  10. Side stepping with theraband  11. Step ups   12. Leg press  13. Heel raises  14. Wall sits

## 2017-04-28 ENCOUNTER — Telehealth (INDEPENDENT_AMBULATORY_CARE_PROVIDER_SITE_OTHER): Payer: Self-pay | Admitting: Orthopaedic Surgery

## 2017-04-28 NOTE — Telephone Encounter (Signed)
Local heat,give it another 2 weeks then office if no change

## 2017-04-28 NOTE — Telephone Encounter (Signed)
[name] [name] the message to use heat for her thigh pain.

## 2017-04-28 NOTE — Telephone Encounter (Signed)
Patient is having "tibia pain" and states it has been going on for a week now. The pain has not improved with any relief efforts. The pain is constant with some sharp pains intermittent. She would like to know what to do for relief or if she needs to be seen in the office. Please advise.

## 2017-04-28 NOTE — Telephone Encounter (Signed)
Please advise 

## 2017-05-16 ENCOUNTER — Encounter (INDEPENDENT_AMBULATORY_CARE_PROVIDER_SITE_OTHER): Payer: Self-pay | Admitting: Orthopaedic Surgery

## 2017-05-16 ENCOUNTER — Ambulatory Visit (INDEPENDENT_AMBULATORY_CARE_PROVIDER_SITE_OTHER): Payer: BLUE CROSS/BLUE SHIELD | Admitting: Orthopaedic Surgery

## 2017-05-16 ENCOUNTER — Ambulatory Visit (INDEPENDENT_AMBULATORY_CARE_PROVIDER_SITE_OTHER): Payer: Self-pay

## 2017-05-16 VITALS — BP 105/66 | HR 80 | Resp 14 | Ht 64.0 in | Wt 175.0 lb

## 2017-05-16 DIAGNOSIS — M25562 Pain in left knee: Secondary | ICD-10-CM | POA: Diagnosis not present

## 2017-05-16 DIAGNOSIS — Z96652 Presence of left artificial knee joint: Secondary | ICD-10-CM

## 2017-05-16 DIAGNOSIS — G8929 Other chronic pain: Secondary | ICD-10-CM

## 2017-05-16 NOTE — Progress Notes (Signed)
Office Visit Note   Patient: [name] [name]           Date of Birth: 15-Sep-1966           MRN: [id number] Visit Date: 05/16/2017              Requested by: Frazier Butt., MD [address] [place], [place] [address] PCP: Frazier Butt., MD   Assessment & Plan: Visit Diagnoses:  1. Chronic pain of left knee   2. History of left knee replacement   Approaching 4 months status post left total knee replacement. It appears that she is improving but still having some discomfort and functional compromise.  Plan: Continue with outpatient exercises. Ice and over-the-counter medicines for pain. Office 3 months. Although [place] still having some discomfort I don't see any problem with her knee replacement. The big picture is that she's definitely improving and no longer using any ambulatory aid  Follow-Up Instructions: Return in about 3 months (around 08/16/2017).   Orders:  Orders Placed This Encounter  Procedures  . XR KNEE 3 VIEW LEFT   No orders of the defined types were placed in this encounter.     Procedures: No procedures performed   Clinical Data: No additional findings.   Subjective: Chief Complaint  Patient presents with  . Left Knee - Routine Post Op    [name] [name] is a 51 y o that is here for a status post approx. 4 months Left To[place] knee replacement.  [name] [name] is just over 3 months status post left total knee replacement. She's had a number of procedures on that knee prior to that surgery which I think will contribute to her slow progress. Overall I think she definitely has improved. She's not using any ambulatory aid. When she is having pain towards the end of the day it's she uses ice or over-the-counter medicine. She has Finished her course of physical therapy and is now working with a home exercise program. She is in aquatic therapy 3 times a week. She denies fever or chills. Still having some pain along the lateral and anterior aspect of her knee. No swelling  distally  HPI  Review of Systems   Objective: Vital Signs: BP 105/66   Pulse 80   Resp 14   Ht 5\' 4"  (1.626 m)   Wt 175 lb (79.4 kg)   LMP 01/23/2015 Comment: perimenopausal (very irregular), no chance pregnant  BMI 30.04 kg/m   Physical Exam  Ortho Exam left knee incision is healed without problem. No effusion. Her left knee is slightly warm compared to the right but in my opinion at normal given the length of time after knee replacement. No instability with a varus or valgus stress. Negative anterior drawer sign. Prescription discomfort along the lateral thigh and anterolateral tibia but without any skin change or induration. Full knee extension and flex and at least 105. No calf pain. No distal edema. Neurovascular exam intact. No pain over the medial tibial plateau  Specialty Comments:  No specialty comments available.  Imaging: Xr Knee 3 View Left  Result Date: 05/16/2017 Terms of the left LT replacement were obtained in 3 projections. The alignment of the components excellent. A screw was identified in the medial tibial plateau as there was a small avulsion chip. The screws in good position no evidence of fracture or other complication    PMFS History: Patient Active Problem List   Diagnosis Date Noted  . Unilateral primary osteoarthritis, left knee  01/21/2017  . S/P total knee replacement using cement, left 01/21/2017  . Chondromalacia patellae, left knee 01/02/2017  . Torn meniscus 06/29/2015  . Osteochondral defect of femoral condyle 06/29/2015  . Plantar fasciitis of right foot 09/27/2014   Past Medical History:  Diagnosis Date  . Anxiety   . Arthritis   . Bell's palsy 2016   left side   . 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 Depression   . Hypercholesteremia   . PONV (postoperative nausea and vomiting)    would like scope patch    History reviewed. No pertinent family history.  Past Surgical History:  Procedure Laterality  Date  . AUGMENTATION MAMMAPLASTY Bilateral    placed 14 years ago   . CHONDROPLASTY Left 06/29/2015   Procedure: CHONDROPLASTY;  Surgeon: Garald Balding, MD;  Location: [place];  Service: Orthopedics;  Laterality: Left;  . FOOT SURGERY Bilateral    Bone Spurs  . KNEE ARTHROSCOPY WITH DRILLING/MICROFRACTURE Left 06/29/2015   Procedure: KNEE ARTHROSCOPY WITH DRILLING/MICROFRACTURE;  Surgeon: Garald Balding, MD;  Location: [place];  Service: Orthopedics;  Laterality: Left;  . KNEE ARTHROSCOPY WITH LATERAL MENISECTOMY Left 06/29/2015   Procedure: KNEE ARTHROSCOPY WITH LATERAL MENISECTOMY;  Surgeon: Garald Balding, MD;  Location: [place];  Service: Orthopedics;  Laterality: Left;  . KNEE ARTHROSCOPY WITH LATERAL RELEASE Left 06/29/2015   Procedure: KNEE ARTHROSCOPY WITH LATERAL RELEASE;  Surgeon: Garald Balding, MD;  Location: [place];  Service: Orthopedics;  Laterality: Left;  . KNEE SURGERY    . Mohls Left    face  . PLANTAR FASCIA RELEASE Right 09/27/2014   Procedure: PLANTAR FASCIA RELEASE;  Surgeon: Garald Balding, MD;  Location: [place];  Service: Orthopedics;  Laterality: Right;  . TONSILLECTOMY    . TOTAL KNEE ARTHROPLASTY Left 01/21/2017   Procedure: TOTAL KNEE ARTHROPLASTY;  Surgeon: Garald Balding, MD;  Location: [place];  Service: Orthopedics;  Laterality: Left;  . Tummy tuck     2010   Social History   Occupational History  . Not on file.   Social History Main Topics  . Smoking status: Never Smoker  . Smokeless tobacco: Never Used  . Alcohol use No  . Drug use: No  . Sexual activity: Not Currently     Garald Balding, MD   Note - This record has been created using [employer].  Chart creation errors have been sought, but may not always  have been located. Such creation errors do not reflect on  the standard of medical care.

## 2017-05-23 ENCOUNTER — Telehealth (INDEPENDENT_AMBULATORY_CARE_PROVIDER_SITE_OTHER): Payer: Self-pay | Admitting: Orthopaedic Surgery

## 2017-05-23 NOTE — Telephone Encounter (Signed)
LVMOM for pt and told her PW and BP are on vacation and not here today

## 2017-05-23 NOTE — Telephone Encounter (Signed)
Patient having a lot of increased pain, and some swelling with knee post increased activity. Patient would like to come in. Advised patient first available appt with doctor is Tuesday. Patient would like a call back to discuss issue.

## 2017-05-24 ENCOUNTER — Encounter (HOSPITAL_COMMUNITY): Payer: Self-pay | Admitting: Orthopaedic Surgery

## 2017-05-24 NOTE — Addendum Note (Signed)
Addendum  created 05/24/17 0846 by Duane Boston, MD   Sign clinical note

## 2017-07-14 ENCOUNTER — Telehealth (INDEPENDENT_AMBULATORY_CARE_PROVIDER_SITE_OTHER): Payer: Self-pay | Admitting: Orthopaedic Surgery

## 2017-07-14 NOTE — Telephone Encounter (Signed)
Patient having a lot of pain with outer side of knee down. Patient wants to know what she can do for the pain. Patient wants Aaron Edelman to call her personally to discuss.

## 2017-07-15 NOTE — Telephone Encounter (Signed)
No answer.  LMOM  11:46am

## 2017-07-16 NOTE — Telephone Encounter (Signed)
No Answer again.  LMOM to call with a number she will answer

## 2017-07-21 ENCOUNTER — Encounter (INDEPENDENT_AMBULATORY_CARE_PROVIDER_SITE_OTHER): Payer: Self-pay | Admitting: Orthopaedic Surgery

## 2017-07-21 ENCOUNTER — Ambulatory Visit (INDEPENDENT_AMBULATORY_CARE_PROVIDER_SITE_OTHER): Payer: BLUE CROSS/BLUE SHIELD | Admitting: Orthopaedic Surgery

## 2017-07-21 VITALS — BP 105/69 | HR 83 | Resp 14 | Ht 64.0 in | Wt 175.0 lb

## 2017-07-21 DIAGNOSIS — G8929 Other chronic pain: Secondary | ICD-10-CM | POA: Diagnosis not present

## 2017-07-21 DIAGNOSIS — M25562 Pain in left knee: Secondary | ICD-10-CM

## 2017-07-21 MED ORDER — DICLOFENAC SODIUM 1 % TD GEL: 2.0000 g | Freq: Four times a day (QID) | TRANSDERMAL | 1 refills | Status: AC

## 2017-07-21 MED ORDER — METHYLPREDNISOLONE ACETATE 40 MG/ML IJ SUSP
80.0000 mg | INTRAMUSCULAR | Status: AC | PRN
  Administered 2017-07-21: 80 mg

## 2017-07-21 MED ORDER — HYDROCODONE-ACETAMINOPHEN 5-325 MG PO TABS: 1.0000 | ORAL_TABLET | Freq: Two times a day (BID) | ORAL | 0 refills | Status: DC | PRN

## 2017-07-21 MED ORDER — BUPIVACAINE HCL 0.5 % IJ SOLN
3.0000 mL | INTRAMUSCULAR | Status: AC | PRN
  Administered 2017-07-21: 3 mL via INTRA_ARTICULAR

## 2017-07-21 MED ORDER — LIDOCAINE HCL 1 % IJ SOLN
5.0000 mL | INTRAMUSCULAR | Status: AC | PRN
  Administered 2017-07-21: 5 mL

## 2017-07-21 NOTE — Progress Notes (Signed)
Office Visit Note   Patient: [name] [name]           Date of Birth: 1966-05-03           MRN: [id number] Visit Date: 07/21/2017              Requested by: Frazier Butt., MD [place], [place] [address] PCP: Frazier Butt., MD   Assessment & Plan: Visit Diagnoses:  1. Chronic pain of left knee   Localized tenderness left total knee replacement along the lateral tibial plateau. Depression  Plan: voltaren gel, Norco for pain, local cortisone injection  Follow-Up Instructions: Return in about 3 months (around 10/21/2017).   Orders:  No orders of the defined types were placed in this encounter.  Meds ordered this encounter  Medications  . diclofenac sodium (VOLTAREN) 1 % GEL    Sig: Apply 2 g topically 4 (four) times daily.    Dispense:  3 Tube    Refill:  1  . HYDROcodone-acetaminophen (NORCO) 5-325 MG tablet    Sig: Take 1 tablet by mouth every 12 (twelve) hours as needed for moderate pain.    Dispense:  40 tablet    Refill:  0      Procedures: Large Joint Inj Date/Time: 07/21/2017 12:33 PM Performed by: Garald Balding Authorized by: Garald Balding   Consent Given by:  Patient Timeout: prior to procedure the correct patient, procedure, and site was verified   Indications:  Pain and joint swelling Location:  Knee Site:  L knee Prep: patient was prepped and draped in usual sterile fashion   Needle Size:  25 G Needle Length:  1.5 inches Approach:  Anteromedial Ultrasound Guidance: No   Fluoroscopic Guidance: No   Arthrogram: No   Medications:  5 mL lidocaine 1 %; 80 mg methylPREDNISolone acetate 40 MG/ML; 3 mL bupivacaine 0.5 % Aspiration Attempted: No   Patient tolerance:  Patient tolerated the procedure well with no immediate complications     Clinical Data: No additional findings.   Subjective: Chief Complaint  Patient presents with  . Left Knee - Routine Post Op    [name] [name] is a 51 y o that is status post 6  months Left TKA. She is tearful at bedside and is still having pain. She relates she still has pain (markings on her L leg)  [name] [name] is 6 months status post primary left total knee replacement. She's had some issues throughout her postoperative course in terms of pain. She has regained most of her motion. She notes that she was doing fairly well until just recently when she's developed some pain along the lateral aspect of her knee. She denies any fever or chills. She denies any trauma. She is outlined the area of her pain along the lateral tibial plateau. She she is aware that her knee was a little larger than the bed on the right but is not aware that she's had any "fluid. She denies any swelling distally. Films of her knee last month did not demonstrate any obvious abnormality  HPI  Review of Systems   Objective: Vital Signs: BP 105/69   Pulse 83   Resp 14   Ht 5\' 4"  (1.626 m)   Wt 175 lb (79.4 kg)   LMP 01/23/2015 Comment: perimenopausal (very irregular), no chance pregnant  BMI 30.04 kg/m   Physical Exam  Ortho Exam cried  throughout the examination today. Very painful to touch the lateral aspect  of the proimal tibia antriorly.No knee effusion. Skin intact. Knee not hot, red or ecchymotic  nor effused.no opening with a varus or valgus stress. Full extension and flexion over 105. No calf pain. No popliteal pain. No patellar discomfort. Patella tracks midline. No localized tenderness medially.   Specialty Comments:  No specialty comments available.  Imaging: No results found.   PMFS History: Patient Active Problem List   Diagnosis Date Noted  . Unilateral primary osteoarthritis, left knee 01/21/2017  . S/P total knee replacement using cement, left 01/21/2017  . Chondromalacia patellae, left knee 01/02/2017  . Torn meniscus 06/29/2015  . Osteochondral defect of femoral condyle 06/29/2015  . Plantar fasciitis of right foot 09/27/2014   Past Medical History:  Diagnosis Date  .  Anxiety   . Arthritis   . Bell's palsy 2016   left side   . Cancer ([place])    skin cancer- left temporal " sitting on bone"  . Depression   . Hypercholesteremia   . PONV (postoperative nausea and vomiting)    would like scope patch    History reviewed. No pertinent family history.  Past Surgical History:  Procedure Laterality Date  . AUGMENTATION MAMMAPLASTY Bilateral    placed 14 years ago   . CHONDROPLASTY Left 06/29/2015   Procedure: CHONDROPLASTY;  Surgeon: Garald Balding, MD;  Location: [facility];  Service: Orthopedics;  Laterality: Left;  . FOOT SURGERY Bilateral    Bone Spurs  . KNEE ARTHROSCOPY WITH DRILLING/MICROFRACTURE Left 06/29/2015   Procedure: KNEE ARTHROSCOPY WITH DRILLING/MICROFRACTURE;  Surgeon: Garald Balding, MD;  Location: [facility];  Service: Orthopedics;  Laterality: Left;  . KNEE ARTHROSCOPY WITH LATERAL MENISECTOMY Left 06/29/2015   Procedure: KNEE ARTHROSCOPY WITH LATERAL MENISECTOMY;  Surgeon: Garald Balding, MD;  Location: [facility];  Service: Orthopedics;  Laterality: Left;  . KNEE ARTHROSCOPY WITH LATERAL RELEASE Left 06/29/2015   Procedure: KNEE ARTHROSCOPY WITH LATERAL RELEASE;  Surgeon: Garald Balding, MD;  Location: [facility];  Service: Orthopedics;  Laterality: Left;  . KNEE SURGERY    . Mohls Left    face  . PLANTAR FASCIA RELEASE Right 09/27/2014   Procedure: PLANTAR FASCIA RELEASE;  Surgeon: Garald Balding, MD;  Location: [facility];  Service: Orthopedics;  Laterality: Right;  . TONSILLECTOMY    . TOTAL KNEE ARTHROPLASTY Left 01/21/2017   Procedure: TOTAL KNEE ARTHROPLASTY;  Surgeon: Garald Balding, MD;  Location: [facility];  Service: Orthopedics;  Laterality: Left;  . Tummy tuck     2010   Social History   Occupational History  . Not on file.   Social History Main Topics  . Smoking status: Never Smoker  . Smokeless tobacco: Never Used  . Alcohol use No    . Drug use: No  . Sexual activity: Not Currently     Garald Balding, MD   Note - This record has been created using [employer].  Chart creation errors have been sought, but may not always  have been located. Such creation errors do not reflect on  the standard of medical care.

## 2017-07-31 ENCOUNTER — Telehealth (INDEPENDENT_AMBULATORY_CARE_PROVIDER_SITE_OTHER): Payer: Self-pay | Admitting: Orthopaedic Surgery

## 2017-07-31 NOTE — Telephone Encounter (Signed)
Patient calling because pharmacy sending in prior authorization for anti inflammatory meds. Patient still having a lot of pain.  Patient request that form be filled out ASAP. Injection did not help. Please call patient with questions.

## 2017-07-31 NOTE — Telephone Encounter (Signed)
Sent again to plan

## 2017-08-18 ENCOUNTER — Telehealth (INDEPENDENT_AMBULATORY_CARE_PROVIDER_SITE_OTHER): Payer: Self-pay | Admitting: Orthopaedic Surgery

## 2017-08-18 ENCOUNTER — Other Ambulatory Visit (INDEPENDENT_AMBULATORY_CARE_PROVIDER_SITE_OTHER): Payer: Self-pay

## 2017-08-18 DIAGNOSIS — M25562 Pain in left knee: Principal | ICD-10-CM

## 2017-08-18 DIAGNOSIS — G8929 Other chronic pain: Secondary | ICD-10-CM

## 2017-08-18 NOTE — Telephone Encounter (Signed)
Sent referral 

## 2017-08-18 NOTE — Telephone Encounter (Signed)
Patient would like an MRI of Lt Knee. She is still experiencing a lot of pain from am to pm.

## 2017-08-30 ENCOUNTER — Ambulatory Visit
Admission: RE | Admit: 2017-08-30 | Discharge: 2017-08-30 | Disposition: A | Discharge status: Home / Self Care | Payer: BLUE CROSS/BLUE SHIELD | Source: Ambulatory Visit | Attending: Orthopaedic Surgery | Admitting: Orthopaedic Surgery

## 2017-08-30 DIAGNOSIS — G8929 Other chronic pain: Secondary | ICD-10-CM

## 2017-08-30 DIAGNOSIS — M25562 Pain in left knee: Principal | ICD-10-CM

## 2017-08-31 ENCOUNTER — Other Ambulatory Visit: Payer: BLUE CROSS/BLUE SHIELD

## 2017-09-01 ENCOUNTER — Telehealth (INDEPENDENT_AMBULATORY_CARE_PROVIDER_SITE_OTHER): Payer: Self-pay | Admitting: Orthopedic Surgery

## 2017-09-01 NOTE — Telephone Encounter (Signed)
Patient would like her MRI read from Saturday. Patient is requesting this be read ASAP and call her please. She states she is in tremendous pain

## 2017-09-02 NOTE — Telephone Encounter (Signed)
x

## 2017-09-02 NOTE — Telephone Encounter (Signed)
Patient would like Aaron Edelman to review MRI, and discuss with patient. Per patient knee is HUGE, and swollen. Patient is having so much pain. Patient also would like something for pain. Patient is very tearful. Patient uses [facility] in [place].

## 2017-09-02 NOTE — Telephone Encounter (Signed)
What to do??

## 2017-09-02 NOTE — Telephone Encounter (Signed)
On the first call she did not want Aaron Edelman to read the MRI. Now she is wanting him to read it since she is in pain

## 2017-09-03 NOTE — Telephone Encounter (Signed)
Called patient and told her her result.  TOld her to make f/u appt with Dr Sydnee Cabal

## 2017-09-03 NOTE — Telephone Encounter (Signed)
Patient left a message at 5pm, returning Brian's call.

## 2017-09-03 NOTE — Telephone Encounter (Signed)
lvmom yesterday

## 2017-11-04 ENCOUNTER — Ambulatory Visit (INDEPENDENT_AMBULATORY_CARE_PROVIDER_SITE_OTHER): Payer: BLUE CROSS/BLUE SHIELD | Admitting: Orthopaedic Surgery

## 2017-11-10 ENCOUNTER — Ambulatory Visit (INDEPENDENT_AMBULATORY_CARE_PROVIDER_SITE_OTHER): Payer: BLUE CROSS/BLUE SHIELD | Admitting: Orthopaedic Surgery

## 2017-11-10 ENCOUNTER — Encounter (INDEPENDENT_AMBULATORY_CARE_PROVIDER_SITE_OTHER): Payer: Self-pay | Admitting: Orthopaedic Surgery

## 2017-11-10 VITALS — BP 126/72 | HR 80 | Resp 14 | Ht 63.0 in | Wt 180.0 lb

## 2017-11-10 DIAGNOSIS — Z96652 Presence of left artificial knee joint: Secondary | ICD-10-CM | POA: Diagnosis not present

## 2017-11-10 MED ORDER — METHOCARBAMOL 500 MG PO TABS: 500.0000 mg | ORAL_TABLET | Freq: Two times a day (BID) | ORAL | 0 refills | Status: DC | PRN

## 2017-11-10 MED ORDER — BUPIVACAINE HCL 0.5 % IJ SOLN
2.0000 mL | INTRAMUSCULAR | Status: AC | PRN
  Administered 2017-11-10: 2 mL via INTRA_ARTICULAR

## 2017-11-10 MED ORDER — LIDOCAINE HCL 1 % IJ SOLN
2.0000 mL | INTRAMUSCULAR | Status: AC | PRN
  Administered 2017-11-10: 2 mL

## 2017-11-10 MED ORDER — METHOCARBAMOL 500 MG PO TABS: 500.0000 mg | ORAL_TABLET | Freq: Two times a day (BID) | ORAL | 0 refills | Status: AC | PRN

## 2017-11-10 MED ORDER — METHYLPREDNISOLONE ACETATE 40 MG/ML IJ SUSP
80.0000 mg | INTRAMUSCULAR | Status: AC | PRN
  Administered 2017-11-10: 80 mg

## 2017-11-10 NOTE — Progress Notes (Signed)
Office Visit Note   Patient: [name] [name]           Date of Birth: Dec 06, 1966           MRN: [id number] Visit Date: 11/10/2017              Requested by: Frazier Butt., MD [place], [place] [address] PCP: Frazier Butt., MD   Assessment & Plan: Visit Diagnoses:  1. History of left knee replacement     Plan: 10 months status post primary left total knee replacement. [name] [name] is really a difficult time throughout her postoperative course. She's having a localized area of tenderness along the lateral tibia with some referred pain into the anterior compartment. She's not had any fever or chills. She's had an MRI scan that didn't reveal any abnormalities several months ago. She's now working 2 jobs and is on her feet. I'm going to try a local cortisone injection over the area of tenderness that was not in the joint and monitor her response. Also give her prescription for Robaxin. [name] [name] is a very difficult to the postoperative course and I'm really not sure why she is having her present pain or doesn't appear to be any abnormality clinically. Hopefully injection will help  Follow-Up Instructions: Return if symptoms worsen or fail to improve.   Orders:  Orders Placed This Encounter  Procedures  . Large Joint Inj: L knee   Meds ordered this encounter  Medications  . DISCONTD: methocarbamol (ROBAXIN) 500 MG tablet    Sig: Take 1 tablet (500 mg total) every 12 (twelve) hours as needed by mouth for muscle spasms.    Dispense:  30 tablet    Refill:  0  . DISCONTD: methocarbamol (ROBAXIN) 500 MG tablet    Sig: Take 1 tablet (500 mg total) every 12 (twelve) hours as needed by mouth for muscle spasms.    Dispense:  30 tablet    Refill:  0  . methocarbamol (ROBAXIN) 500 MG tablet    Sig: Take 1 tablet (500 mg total) every 12 (twelve) hours as needed by mouth for muscle spasms.    Dispense:  30 tablet    Refill:  0      Procedures: Large Joint Inj: L knee  on 11/10/2017 5:38 PM Indications: pain and diagnostic evaluation Details: 27 G 1.5 in needle, anteromedial approach  Arthrogram: No  Medications: 2 mL lidocaine 1 %; 2 mL bupivacaine 0.5 %; 80 mg methylPREDNISolone acetate 40 MG/ML Procedure, treatment alternatives, risks and benefits explained, specific risks discussed. Consent was given by the patient. Patient was prepped and draped in the usual sterile fashion.       Clinical Data: No additional findings.   Subjective: Chief Complaint  Patient presents with  . Left Knee - Pain, Edema    [name] [name] is a 51 y o that is here today for chronic left knee pain. Hx of  L TKA in January, 2018. SHe also has shin pain.  No fever or chills. Pain is localized along the proximal tibia laterally. No injuries. No longer having any pain about the patella.  HPI  Review of Systems  Constitutional: Negative for chills, fatigue and fever.  Eyes: Negative for itching.  Respiratory: Negative for chest tightness and shortness of breath.   Cardiovascular: Negative for chest pain, palpitations and leg swelling.  Gastrointestinal: Negative for blood in stool, constipation and diarrhea.  Endocrine: Negative for polyuria.  Genitourinary: Negative for dysuria.  Musculoskeletal: Positive for back pain. Negative for joint swelling, neck pain and neck stiffness.  Allergic/Immunologic: Negative for immunocompromised state.  Neurological: Negative for dizziness and numbness.  Hematological: Does not bruise/bleed easily.  Psychiatric/Behavioral: Positive for sleep disturbance. The patient is not nervous/anxious.      Objective: Vital Signs: BP 126/72   Pulse 80   Resp 14   Ht 5\' 3"  (1.6 m)   Wt 180 lb (81.6 kg)   LMP 01/23/2015 Comment: perimenopausal (very irregular), no chance pregnant  BMI 31.89 kg/m   Physical Exam  Ortho Exam left knee with full extension flex to 100. No instability. No effusion. The knee was not hot red or swollen. Has  an area of localized tenderness along the proximal tibia laterally almost to be at the origin of the anterior compartment. No skin changes. No pain along the medial or lateral compartment or the about the patella.  Specialty Comments:  No specialty comments available.  Imaging: No results found.   PMFS History: Patient Active Problem List   Diagnosis Date Noted  . Unilateral primary osteoarthritis, left knee 01/21/2017  . S/P total knee replacement using cement, left 01/21/2017  . Chondromalacia patellae, left knee 01/02/2017  . Torn meniscus 06/29/2015  . Osteochondral defect of femoral condyle 06/29/2015  . Plantar fasciitis of right foot 09/27/2014   Past Medical History:  Diagnosis Date  . Anxiety   . Arthritis   . Bell's palsy 2016   left side   . 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 Depression   . Hypercholesteremia   . PONV (postoperative nausea and vomiting)    would like scope patch    History reviewed. No pertinent family history.  Past Surgical History:  Procedure Laterality Date  . AUGMENTATION MAMMAPLASTY Bilateral    placed 14 years ago   . CHONDROPLASTY Left 06/29/2015   Performed by Garald Balding, MD at [facility]  . FOOT SURGERY Bilateral    Bone Spurs  . KNEE ARTHROSCOPY WITH DRILLING/MICROFRACTURE Left 06/29/2015   Performed by Garald Balding, MD at [facility] ARTHROSCOPY WITH LATERAL MENISECTOMY Left 06/29/2015   Performed by Garald Balding, MD at [facility] Left 06/29/2015   Performed by Garald Balding, MD at [facility]    . Mohls Left    face  . PLANTAR FASCIA RELEASE Right 09/27/2014   Performed by Garald Balding, MD at [facility]  . TONSILLECTOMY    . TOTAL KNEE ARTHROPLASTY Left 01/21/2017   Performed by Garald Balding, MD at [facility]  . Tummy tuck     2010    Social History   Occupational History  . Not on file  Tobacco Use  . Smoking status: Never Smoker  . Smokeless tobacco: Never Used  Substance and Sexual Activity  . Alcohol use: No  . Drug use: No  . Sexual activity: Not Currently

## 2018-02-10 ENCOUNTER — Telehealth (INDEPENDENT_AMBULATORY_CARE_PROVIDER_SITE_OTHER): Payer: Self-pay | Admitting: Orthopaedic Surgery

## 2018-02-10 NOTE — Telephone Encounter (Signed)
Patient called requesting that you return her call ASAP.  Patient would not leave any information regarding what the call was about.

## 2018-02-10 NOTE — Telephone Encounter (Signed)
LMOM

## 2018-02-11 NOTE — Telephone Encounter (Addendum)
Patient requesting PT for left knee-TKR , [facility] in [facility], still having pain, stiffness, falling x 2 last week. Please advise.

## 2018-02-11 NOTE — Telephone Encounter (Signed)
Patient called upset because she was sent to collections. I advised patient to call the number on her statement to discuss. She said she did and felt like it was [employer] fault because she never received a letter. Is there anything we can do to help this patient?

## 2018-02-12 ENCOUNTER — Other Ambulatory Visit: Payer: Self-pay | Admitting: *Deleted

## 2018-02-12 DIAGNOSIS — G8929 Other chronic pain: Secondary | ICD-10-CM

## 2018-02-12 DIAGNOSIS — M25562 Pain in left knee: Principal | ICD-10-CM

## 2018-02-12 NOTE — Telephone Encounter (Signed)
Have Randall Hiss check into this please

## 2018-05-26 ENCOUNTER — Encounter (INDEPENDENT_AMBULATORY_CARE_PROVIDER_SITE_OTHER): Payer: Self-pay | Admitting: Orthopaedic Surgery

## 2018-05-26 ENCOUNTER — Other Ambulatory Visit (INDEPENDENT_AMBULATORY_CARE_PROVIDER_SITE_OTHER): Payer: Self-pay | Admitting: Radiology

## 2018-05-26 ENCOUNTER — Ambulatory Visit (INDEPENDENT_AMBULATORY_CARE_PROVIDER_SITE_OTHER): Payer: BLUE CROSS/BLUE SHIELD | Admitting: Orthopaedic Surgery

## 2018-05-26 ENCOUNTER — Ambulatory Visit (INDEPENDENT_AMBULATORY_CARE_PROVIDER_SITE_OTHER): Payer: Self-pay

## 2018-05-26 VITALS — BP 109/77 | HR 80 | Ht 65.0 in | Wt 145.0 lb

## 2018-05-26 DIAGNOSIS — M25571 Pain in right ankle and joints of right foot: Secondary | ICD-10-CM

## 2018-05-26 MED ORDER — LIDOCAINE HCL 1 % IJ SOLN
1.0000 mL | INTRAMUSCULAR | Status: AC | PRN
  Administered 2018-05-26: 1 mL

## 2018-05-26 MED ORDER — HYDROCODONE-ACETAMINOPHEN 5-325 MG PO TABS: ORAL_TABLET | ORAL | 0 refills | Status: DC

## 2018-05-26 MED ORDER — DICLOFENAC SODIUM 1 % TD GEL: TRANSDERMAL | 1 refills | Status: AC

## 2018-05-26 MED ORDER — METHYLPREDNISOLONE ACETATE 40 MG/ML IJ SUSP
40.0000 mg | INTRAMUSCULAR | Status: AC | PRN
  Administered 2018-05-26: 40 mg via INTRAMUSCULAR

## 2018-05-26 NOTE — Progress Notes (Signed)
Office Visit Note   Patient: [name] [name]           Date of Birth: [birth year]           MRN: [id number] Visit Date: 05/26/2018              Requested by: Frazier Butt., MD [address] [place], [place] [address] PCP: Frazier Butt., MD   Assessment & Plan: Visit Diagnoses:  1. Pain in right ankle and joints of right foot     Plan: Right foot pain appears to be a peroneus longus tendinitis at the base the fifth metatarsal.  Also has some very small ganglion cyst about the midfoot which are minimally symptomatic.  We will try an ankle support to prevent inversion and eversion with anti-inflammatory medicines.  Hydrocodone with Tylenol for pain.  Voltaren gel.  Office as needed Follow-Up Instructions: Return if symptoms worsen or fail to improve.   Orders:  Orders Placed This Encounter  Procedures  . XR Foot Complete Right   No orders of the defined types were placed in this encounter.     Procedures: Trigger Point Inj Date/Time: 05/26/2018 5:12 PM Performed by: Garald Balding, MD Authorized by: Garald Balding, MD   Consent Given by:  Patient Total # of Trigger Points:  1 Location: lower extremity   Needle Size:  27 G Approach:  Lateral Medications #1:  1 mL lidocaine 1 %; 40 mg methylPREDNISolone acetate 40 MG/ML     Clinical Data: No additional findings.   Subjective: Chief Complaint  Patient presents with  . Right Foot - Pain  . Left Knee - Pain  . Follow-up    L KNEE PAIN WITH REPLACEMENT 12/2016, NO NEW INJURY, RIGHT FOOT PAIN FOR 1 MO, NO INJURY  Recent onset of lateral right foot pain without obvious injury or trauma.  [name] [name] spends a lot of time on her feet at work and working around her house.  Has developed pain along the lateral aspect of her right foot without any skin changes also is noted some swelling in the dorsum of her midfoot.  Has had some burning of the third toe but not on the plantar aspect of her foot.  No specific ankle  pain. Having a localized area of tenderness along the anterior lateral tibial plateau of her knee replacement.  I have injected this in the past with relief.  We will do that again before she leaves today HPI  Review of Systems  Constitutional: Positive for fatigue. Negative for fever.  HENT: Negative for ear pain.   Eyes: Negative for pain.  Respiratory: Negative for cough and shortness of breath.   Cardiovascular: Positive for leg swelling.  Gastrointestinal: Negative for constipation and diarrhea.  Genitourinary: Negative for difficulty urinating.  Musculoskeletal: Negative for back pain and neck pain.  Skin: Negative for rash.  Allergic/Immunologic: Negative for food allergies.  Neurological: Negative for weakness and numbness.  Hematological: Bruises/bleeds easily.  Psychiatric/Behavioral: Positive for sleep disturbance.     Objective: Vital Signs: BP 109/77 (BP Location: Left Arm, Patient Position: Sitting, Cuff Size: Normal)   Pulse 80   Ht 5\' 5"  (1.651 m)   Wt 145 lb (65.8 kg) Comment: PER PT.  LMP 01/23/2015 Comment: perimenopausal (very irregular), no chance pregnant  BMI 24.13 kg/m   Physical Exam  Constitutional: She is oriented to person, place, and time. She appears well-developed and well-nourished.  HENT:  Mouth/Throat: Oropharynx is clear and moist.  Eyes: Pupils are equal, round, and reactive to light. EOM are normal.  Pulmonary/Chest: Effort normal.  Neurological: She is alert and oriented to person, place, and time.  Skin: Skin is warm and dry.  Psychiatric: She has a normal mood and affect. Her behavior is normal.    Ortho Exam awake alert and oriented x3.  Comfortable sitting.  Examination of the right foot demonstrates pain at the base of the fifth metatarsal.  No skin changes.  No ecchymosis.  Also has what appears to be ganglion cysts along the midfoot near the talonavicular joint.  There are not red.  They are not comfortable.  Also some very mild  swelling along the peroneal tendons behind the lateral malleolus.  I wonder if she does not have a peroneal tendinitis.  No swelling of her toes.  No plantar pain.  Good pulses. Left total knee replacement incision is healed without a problem.  No instability.  No effusion.  Knee was not particularly warm.  Full extension about 106 degrees of flexion with a goniometer.  No instability Specialty Comments:  No specialty comments available.  Imaging: Xr Foot Complete Right  Result Date: 05/26/2018 Films of the right foot were obtained in 3 projections.  There are no acute changes at the base of the fifth metatarsal with the patient is symptomatic.  There is an office perineum.  No other ectopic calcification.  Small plantar heel spur which is also asymptomatic.  No midfoot changes.    PMFS History: Patient Active Problem List   Diagnosis Date Noted  . Unilateral primary osteoarthritis, left knee 01/21/2017  . S/P total knee replacement using cement, left 01/21/2017  . Chondromalacia patellae, left knee 01/02/2017  . Torn meniscus 06/29/2015  . Osteochondral defect of femoral condyle 06/29/2015  . Plantar fasciitis of right foot 09/27/2014   Past Medical History:  Diagnosis Date  . Anxiety   . Arthritis   . Bell's palsy 2016   left side   . 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 Depression   . Hypercholesteremia   . PONV (postoperative nausea and vomiting)    would like scope patch    History reviewed. No pertinent family history.  Past Surgical History:  Procedure Laterality Date  . AUGMENTATION MAMMAPLASTY Bilateral    placed 14 years ago   . CHONDROPLASTY Left 06/29/2015   Procedure: CHONDROPLASTY;  Surgeon: Garald Balding, MD;  Location: [facility];  Service: Orthopedics;  Laterality: Left;  . FOOT SURGERY Bilateral    Bone Spurs  . KNEE ARTHROSCOPY WITH DRILLING/MICROFRACTURE Left 06/29/2015   Procedure: KNEE ARTHROSCOPY WITH  DRILLING/MICROFRACTURE;  Surgeon: Garald Balding, MD;  Location: [facility];  Service: Orthopedics;  Laterality: Left;  . KNEE ARTHROSCOPY WITH LATERAL MENISECTOMY Left 06/29/2015   Procedure: KNEE ARTHROSCOPY WITH LATERAL MENISECTOMY;  Surgeon: Garald Balding, MD;  Location: [facility];  Service: Orthopedics;  Laterality: Left;  . KNEE ARTHROSCOPY WITH LATERAL RELEASE Left 06/29/2015   Procedure: KNEE ARTHROSCOPY WITH LATERAL RELEASE;  Surgeon: Garald Balding, MD;  Location: [facility];  Service: Orthopedics;  Laterality: Left;  . KNEE SURGERY    . Mohls Left    face  . PLANTAR FASCIA RELEASE Right 09/27/2014   Procedure: PLANTAR FASCIA RELEASE;  Surgeon: Garald Balding, MD;  Location: [facility];  Service: Orthopedics;  Laterality: Right;  . TONSILLECTOMY    . TOTAL  KNEE ARTHROPLASTY Left 01/21/2017   Procedure: TOTAL KNEE ARTHROPLASTY;  Surgeon: Garald Balding, MD;  Location: [facility];  Service: Orthopedics;  Laterality: Left;  . Tummy tuck     2010   Social History   Occupational History  . Not on file  Tobacco Use  . Smoking status: Never Smoker  . Smokeless tobacco: Never Used  Substance and Sexual Activity  . Alcohol use: No  . Drug use: No  . Sexual activity: Not Currently

## 2018-06-03 ENCOUNTER — Telehealth: Payer: Self-pay | Admitting: Radiology

## 2018-06-03 NOTE — Telephone Encounter (Signed)
Waiting on approval from [email or web address]

## 2018-07-09 ENCOUNTER — Telehealth (INDEPENDENT_AMBULATORY_CARE_PROVIDER_SITE_OTHER): Payer: Self-pay | Admitting: Radiology

## 2018-07-09 NOTE — Telephone Encounter (Signed)
Hydrocodone   Approvedon June 14  Effective from 06/03/2018 through 07/02/2018

## 2018-07-31 ENCOUNTER — Encounter (INDEPENDENT_AMBULATORY_CARE_PROVIDER_SITE_OTHER): Payer: Self-pay | Admitting: Orthopaedic Surgery

## 2018-07-31 ENCOUNTER — Ambulatory Visit (INDEPENDENT_AMBULATORY_CARE_PROVIDER_SITE_OTHER): Payer: BLUE CROSS/BLUE SHIELD | Admitting: Orthopaedic Surgery

## 2018-07-31 VITALS — BP 130/83 | HR 61 | Ht 65.0 in | Wt 180.0 lb

## 2018-07-31 DIAGNOSIS — Z96652 Presence of left artificial knee joint: Secondary | ICD-10-CM | POA: Diagnosis not present

## 2018-07-31 DIAGNOSIS — M25562 Pain in left knee: Secondary | ICD-10-CM | POA: Diagnosis not present

## 2018-07-31 DIAGNOSIS — M25571 Pain in right ankle and joints of right foot: Secondary | ICD-10-CM

## 2018-07-31 MED ORDER — HYDROCODONE-ACETAMINOPHEN 5-325 MG PO TABS: 1.0000 | ORAL_TABLET | Freq: Two times a day (BID) | ORAL | 0 refills | Status: DC | PRN

## 2018-07-31 MED ORDER — METHYLPREDNISOLONE ACETATE 40 MG/ML IJ SUSP
40.0000 mg | INTRAMUSCULAR | Status: AC | PRN
  Administered 2018-07-31: 40 mg via INTRA_ARTICULAR

## 2018-07-31 MED ORDER — LIDOCAINE HCL 1 % IJ SOLN
1.0000 mL | INTRAMUSCULAR | Status: AC | PRN
  Administered 2018-07-31: 1 mL

## 2018-07-31 MED ORDER — BUPIVACAINE HCL 0.5 % IJ SOLN
0.5000 mL | INTRAMUSCULAR | Status: AC | PRN
  Administered 2018-07-31: .5 mL via INTRA_ARTICULAR

## 2018-07-31 MED ORDER — LIDOCAINE HCL 1 % IJ SOLN
2.0000 mL | INTRAMUSCULAR | Status: AC | PRN
  Administered 2018-07-31: 2 mL

## 2018-07-31 MED ORDER — GABAPENTIN 300 MG PO CAPS: ORAL_CAPSULE | ORAL | 0 refills | Status: AC

## 2018-07-31 MED ORDER — METHYLPREDNISOLONE ACETATE 40 MG/ML IJ SUSP
80.0000 mg | INTRAMUSCULAR | Status: AC | PRN
  Administered 2018-07-31: 80 mg

## 2018-07-31 MED ORDER — BUPIVACAINE HCL 0.5 % IJ SOLN
2.0000 mL | INTRAMUSCULAR | Status: AC | PRN
  Administered 2018-07-31: 2 mL via INTRA_ARTICULAR

## 2018-07-31 NOTE — Progress Notes (Addendum)
Office Visit Note   Patient: [name] [name]           Date of Birth: March 19, 1966           MRN: [id number] Visit Date: 07/31/2018              Requested by: Frazier Butt., MD [address] [place], [place] [address] PCP: Frazier Butt., MD   Assessment & Plan: Visit Diagnoses:  1. Pain in right ankle and joints of right foot   2. History of left knee replacement     Plan: Persistent pain dorsal lateral aspect of right foot.  Prior films negative.  Pain is localized letter the base of the fifth metatarsal.  Will order MRI scan and injected area with cortisone. Continued pain from her left total knee replacement.  Surgery a year and a half ago.  MRI scan was negative.  Exam is benign.  I think we should initiate further evaluation with a bone scan CBC and sed rate after the MRI scan of her right foot.  Having some local tenderness along the proximal medial tibia which will I will inject with cortisone. Difficult time emotionally with recent loss of her job.  Office visit over 30 minutes discussing all of the above 50% of the time in counseling We will give her prescription for Norco for pain and try gabapentin at night Follow-Up Instructions: Return after MRI right foot.   Orders:  Orders Placed This Encounter  Procedures  . MR Foot Right w/o contrast   Meds ordered this encounter  Medications  . HYDROcodone-acetaminophen (NORCO) 5-325 MG tablet    Sig: Take 1 tablet by mouth every 12 (twelve) hours as needed for moderate pain.    Dispense:  40 tablet    Refill:  0  . gabapentin (NEURONTIN) 300 MG capsule    Sig: TAKE 1 TAB BY MOUTH AT NIGHT    Dispense:  20 capsule    Refill:  0      Procedures: Large Joint Inj: L knee on 07/31/2018 9:36 AM Indications: pain and diagnostic evaluation Details: 25 G 1.5 in needle, anteromedial approach  Arthrogram: No  Medications: 2 mL bupivacaine 0.5 %; 2 mL lidocaine 1 %; 80 mg methylPREDNISolone acetate 40 MG/ML Procedure,  treatment alternatives, risks and benefits explained, specific risks discussed. Consent was given by the patient. Patient was prepped and draped in the usual sterile fashion.   Small Joint Inj: R intertarsal on 07/31/2018 9:36 AM Details: 27 G needle, dorsal approach  Spinal Needle: No  Medications: 1 mL lidocaine 1 %; 0.5 mL bupivacaine 0.5 %; 40 mg methylPREDNISolone acetate 40 MG/ML      Clinical Data: No additional findings.   Subjective: No chief complaint on file. A year and a half status post primary left total knee replacement.  Has had almost persistent pain to the point of compromise.  Exam has been consistently benign.  There is been no effusion.  No evidence of instability.  MRI scan earlier in the year was negative.  I am not sure the reason why she is having her pain local areas of tenderness have been injected with cortisone without much relief.  No history of injury or trauma.  Has not been able to consistently work with exercises as she is been working.  She recently changed jobs and lost that position earlier this week which has been an emotional setback.  Oftentimes feels a sensation of something catching in her knee Also  been experiencing chronic pain dorsal lateral aspect of her right foot.  When she was here several months ago x-rays were negative.  Pain is localized near the base of the fifth metatarsal.  Oftentimes she may feel a mass.  No skin changes and no injury or trauma.  Occasionally she will have some burning in the dorsum of her foot  HPI  Review of Systems   Objective: Vital Signs: BP 130/83 (BP Location: Left Arm, Patient Position: Sitting, Cuff Size: Normal)   Pulse 61   Ht 5\' 5"  (1.651 m)   Wt 180 lb (81.6 kg)   LMP 01/23/2015 Comment: perimenopausal (very irregular), no chance pregnant  BMI 29.95 kg/m   Physical Exam  Constitutional: She is oriented to person, place, and time. She appears well-developed and well-nourished.  HENT:    Mouth/Throat: Oropharynx is clear and moist.  Eyes: Pupils are equal, round, and reactive to light. EOM are normal.  Pulmonary/Chest: Effort normal.  Neurological: She is alert and oriented to person, place, and time.  Skin: Skin is warm and dry.  Psychiatric: She has a normal mood and affect. Her behavior is normal.    Ortho Exam awake alert and oriented x3.  Comfortable sitting.  Cried throughout but good part of the exam related to her chronic pain and her recent loss of job.  Left knee exam was benign.  There was no effusion.  She had full quick extension I measured about 107 degrees of flexion.  No opening with varus valgus stress.  Negative anterior drawer sign.  Some local areas of tenderness along the medial and lateral proximal tibia.  Patella pain.  Neurovascular exam intact distally.  No distal edema.  No popliteal pain.  Straight leg raise negative.  No pain with range of motion of either hip.  Examination of right foot reveals some tenderness at the base of the fifth metatarsal.  No skin changes.  No masses.  No plantar pain no heel pain.  No ankle pain.  No Achilles discomfort.  Does have some osteophytes palpable along the midfoot which were not tender.  Has had some burning into the middle toes but no pain in the intermetatarsal or intertarsal areas.  No evidence of a neuroma.  No plantar pain  Specialty Comments:  No specialty comments available.  Imaging: No results found.   PMFS History: Patient Active Problem List   Diagnosis Date Noted  . Unilateral primary osteoarthritis, left knee 01/21/2017  . S/P total knee replacement using cement, left 01/21/2017  . Chondromalacia patellae, left knee 01/02/2017  . Torn meniscus 06/29/2015  . Osteochondral defect of femoral condyle 06/29/2015  . Plantar fasciitis of right foot 09/27/2014   Past Medical History:  Diagnosis Date  . Anxiety   . Arthritis   . Bell's palsy 2016   left side   . Cancer ([facility])    skin cancer-  left temporal " sitting on bone"  . Depression   . Hypercholesteremia   . PONV (postoperative nausea and vomiting)    would like scope patch    History reviewed. No pertinent family history.  Past Surgical History:  Procedure Laterality Date  . AUGMENTATION MAMMAPLASTY Bilateral    placed 14 years ago   . CHONDROPLASTY Left 06/29/2015   Procedure: CHONDROPLASTY;  Surgeon: Garald Balding, MD;  Location: [facility];  Service: Orthopedics;  Laterality: Left;  . FOOT SURGERY Bilateral    Bone Spurs  . KNEE ARTHROSCOPY WITH DRILLING/MICROFRACTURE Left 06/29/2015  Procedure: KNEE ARTHROSCOPY WITH DRILLING/MICROFRACTURE;  Surgeon: Garald Balding, MD;  Location: [facility];  Service: Orthopedics;  Laterality: Left;  . KNEE ARTHROSCOPY WITH LATERAL MENISECTOMY Left 06/29/2015   Procedure: KNEE ARTHROSCOPY WITH LATERAL MENISECTOMY;  Surgeon: Garald Balding, MD;  Location: [facility];  Service: Orthopedics;  Laterality: Left;  . KNEE ARTHROSCOPY WITH LATERAL RELEASE Left 06/29/2015   Procedure: KNEE ARTHROSCOPY WITH LATERAL RELEASE;  Surgeon: Garald Balding, MD;  Location: [facility];  Service: Orthopedics;  Laterality: Left;  . KNEE SURGERY    . Mohls Left    face  . PLANTAR FASCIA RELEASE Right 09/27/2014   Procedure: PLANTAR FASCIA RELEASE;  Surgeon: Garald Balding, MD;  Location: [facility];  Service: Orthopedics;  Laterality: Right;  . TONSILLECTOMY    . TOTAL KNEE ARTHROPLASTY Left 01/21/2017   Procedure: TOTAL KNEE ARTHROPLASTY;  Surgeon: Garald Balding, MD;  Location: [facility];  Service: Orthopedics;  Laterality: Left;  . Tummy tuck     2010   Social History   Occupational History  . Not on file  Tobacco Use  . Smoking status: Never Smoker  . Smokeless tobacco: Never Used  Substance and Sexual Activity  . Alcohol use: No  . Drug use: No  . Sexual activity: Not Currently     Garald Balding,  MD   Note - This record has been created using [employer].  Chart creation errors have been sought, but may not always  have been located. Such creation errors do not reflect on  the standard of medical care.

## 2018-08-08 ENCOUNTER — Ambulatory Visit
Admission: RE | Admit: 2018-08-08 | Discharge: 2018-08-08 | Disposition: A | Discharge status: Home / Self Care | Payer: BLUE CROSS/BLUE SHIELD | Source: Ambulatory Visit | Attending: Orthopaedic Surgery | Admitting: Orthopaedic Surgery

## 2018-08-08 DIAGNOSIS — M25571 Pain in right ankle and joints of right foot: Secondary | ICD-10-CM

## 2018-08-11 ENCOUNTER — Ambulatory Visit (INDEPENDENT_AMBULATORY_CARE_PROVIDER_SITE_OTHER): Payer: BLUE CROSS/BLUE SHIELD | Admitting: Orthopaedic Surgery

## 2018-08-11 ENCOUNTER — Encounter (INDEPENDENT_AMBULATORY_CARE_PROVIDER_SITE_OTHER): Payer: Self-pay | Admitting: Orthopaedic Surgery

## 2018-08-11 VITALS — BP 117/70 | HR 68 | Resp 14 | Ht 65.0 in | Wt 168.0 lb

## 2018-08-11 DIAGNOSIS — M79671 Pain in right foot: Secondary | ICD-10-CM

## 2018-08-11 NOTE — Progress Notes (Signed)
Office Visit Note   Patient: [name] [name]           Date of Birth: 1966/05/31           MRN: [id number] Visit Date: 08/11/2018              Requested by: Frazier Butt., MD [address] [place], [place] [address] PCP: Frazier Butt., MD   Assessment & Plan: Visit Diagnoses:  1. Right foot pain     Plan: MRI scan demonstrates some degenerative change in the midfoot as well as a large ganglion cyst extending from just above the ankle to just below the ankle and medial to the extensor digitorum longus tendons.  The cortisone injection her foot last week made a "big difference" and she is asymptomatic.  Discussed the MRI scan with her.  Treatment would be good comfortable shoes with arch supports and occasional cortisone injection.  No need to address the ganglion cyst as it is asymptomatic  Follow-Up Instructions: Return if symptoms worsen or fail to improve.   Orders:  No orders of the defined types were placed in this encounter.  No orders of the defined types were placed in this encounter.     Procedures: No procedures performed   Clinical Data: No additional findings.   Subjective: Chief Complaint  Patient presents with  . Foot Pain    Right foot MRI results  [name] has been experiencing recurrent pain in her right foot with essentially negative x-rays.  I injected areas of pain in the midfoot last week with excellent result.  I also ordered an MRI scan because of her discomfort.  Scan demonstrates a large ganglion cyst extending from above to below the ankle joint and medial to the extensor digitorum longus tendon.  There are some midfoot degenerative changes which apparently accounts for her pain.  She is not having much pain today and walks without a limp  HPI  Review of Systems  Constitutional: Positive for activity change.  HENT: Negative for trouble swallowing.   Eyes: Negative for pain.  Respiratory: Negative for shortness of breath.   Cardiovascular:  Negative for leg swelling.  Gastrointestinal: Negative for constipation.  Endocrine: Negative for cold intolerance.  Genitourinary: Negative for difficulty urinating.  Musculoskeletal: Positive for gait problem and joint swelling.  Skin: Negative for rash.  Allergic/Immunologic: Negative for food allergies.  Neurological: Negative for weakness.  Hematological: Does not bruise/bleed easily.  Psychiatric/Behavioral: Negative for sleep disturbance.     Objective: Vital Signs: BP 117/70 (BP Location: Right Arm, Patient Position: Sitting, Cuff Size: Normal)   Pulse 68   Resp 14   Ht 5\' 5"  (1.651 m)   Wt 168 lb (76.2 kg)   LMP 01/23/2015 Comment: perimenopausal (very irregular), no chance pregnant  BMI 27.96 kg/m   Physical Exam  Constitutional: She is oriented to person, place, and time. She appears well-developed and well-nourished.  HENT:  Mouth/Throat: Oropharynx is clear and moist.  Eyes: Pupils are equal, round, and reactive to light. EOM are normal.  Pulmonary/Chest: Effort normal.  Neurological: She is alert and oriented to person, place, and time.  Skin: Skin is warm and dry.  Psychiatric: She has a normal mood and affect. Her behavior is normal.    Ortho Exam awake alert and oriented x3.  Comfortable sitting.  Ganglion cyst is identifiable on the anterior aspect of the ankle.  Not red.  Not painful.  No swelling of the foot.  Neurovascular exam intact.  No areas of tenderness about the midfoot  Specialty Comments:  No specialty comments available.  Imaging: No results found.   PMFS History: Patient Active Problem List   Diagnosis Date Noted  . Unilateral primary osteoarthritis, left knee 01/21/2017  . S/P total knee replacement using cement, left 01/21/2017  . Chondromalacia patellae, left knee 01/02/2017  . Torn meniscus 06/29/2015  . Osteochondral defect of femoral condyle 06/29/2015  . Plantar fasciitis of right foot 09/27/2014   Past Medical History:    Diagnosis Date  . Anxiety   . Arthritis   . Bell's palsy 2016   left side   . 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 Depression   . Hypercholesteremia   . PONV (postoperative nausea and vomiting)    would like scope patch    History reviewed. No pertinent family history.  Past Surgical History:  Procedure Laterality Date  . AUGMENTATION MAMMAPLASTY Bilateral    placed 14 years ago   . CHONDROPLASTY Left 06/29/2015   Procedure: CHONDROPLASTY;  Surgeon: Garald Balding, MD;  Location: [facility];  Service: Orthopedics;  Laterality: Left;  . FOOT SURGERY Bilateral    Bone Spurs  . KNEE ARTHROSCOPY WITH DRILLING/MICROFRACTURE Left 06/29/2015   Procedure: KNEE ARTHROSCOPY WITH DRILLING/MICROFRACTURE;  Surgeon: Garald Balding, MD;  Location: [facility];  Service: Orthopedics;  Laterality: Left;  . KNEE ARTHROSCOPY WITH LATERAL MENISECTOMY Left 06/29/2015   Procedure: KNEE ARTHROSCOPY WITH LATERAL MENISECTOMY;  Surgeon: Garald Balding, MD;  Location: [facility];  Service: Orthopedics;  Laterality: Left;  . KNEE ARTHROSCOPY WITH LATERAL RELEASE Left 06/29/2015   Procedure: KNEE ARTHROSCOPY WITH LATERAL RELEASE;  Surgeon: Garald Balding, MD;  Location: [facility];  Service: Orthopedics;  Laterality: Left;  . KNEE SURGERY    . Mohls Left    face  . PLANTAR FASCIA RELEASE Right 09/27/2014   Procedure: PLANTAR FASCIA RELEASE;  Surgeon: Garald Balding, MD;  Location: [facility];  Service: Orthopedics;  Laterality: Right;  . TONSILLECTOMY    . TOTAL KNEE ARTHROPLASTY Left 01/21/2017   Procedure: TOTAL KNEE ARTHROPLASTY;  Surgeon: Garald Balding, MD;  Location: [facility];  Service: Orthopedics;  Laterality: Left;  . Tummy tuck     2010   Social History   Occupational History  . Not on file  Tobacco Use  . Smoking status: Never Smoker  . Smokeless tobacco: Never Used  Substance and  Sexual Activity  . Alcohol use: No  . Drug use: No  . Sexual activity: Not Currently

## 2018-09-03 ENCOUNTER — Ambulatory Visit (INDEPENDENT_AMBULATORY_CARE_PROVIDER_SITE_OTHER): Payer: BLUE CROSS/BLUE SHIELD | Admitting: Orthopaedic Surgery

## 2018-09-03 ENCOUNTER — Encounter (INDEPENDENT_AMBULATORY_CARE_PROVIDER_SITE_OTHER): Payer: Self-pay | Admitting: Orthopaedic Surgery

## 2018-09-03 VITALS — BP 120/78 | HR 65 | Ht 65.0 in | Wt 168.0 lb

## 2018-09-03 DIAGNOSIS — M79671 Pain in right foot: Secondary | ICD-10-CM

## 2018-09-03 MED ORDER — METHYLPREDNISOLONE ACETATE 40 MG/ML IJ SUSP
40.0000 mg | INTRAMUSCULAR | Status: AC | PRN
  Administered 2018-09-03: 40 mg via INTRA_ARTICULAR

## 2018-09-03 MED ORDER — LIDOCAINE HCL 1 % IJ SOLN
1.0000 mL | INTRAMUSCULAR | Status: AC | PRN
  Administered 2018-09-03: 1 mL

## 2018-09-03 MED ORDER — BUPIVACAINE HCL 0.5 % IJ SOLN
1.0000 mL | INTRAMUSCULAR | Status: AC | PRN
  Administered 2018-09-03: 1 mL via INTRA_ARTICULAR

## 2018-09-03 MED ORDER — HYDROCODONE-ACETAMINOPHEN 5-325 MG PO TABS: 1.0000 | ORAL_TABLET | Freq: Four times a day (QID) | ORAL | 0 refills | Status: DC | PRN

## 2018-09-03 NOTE — Progress Notes (Signed)
Office Visit Note   Patient: [name] [name]           Date of Birth: [birth year]           MRN: [id number] Visit Date: 09/03/2018              Requested by: Frazier Butt., MD [address] [place], [place] [address] PCP: Frazier Butt., MD   Assessment & Plan: Visit Diagnoses:  1. Right foot pain     Plan: Localized midfoot arthritis right foot by MRI scan.  Will inject localizers tenderness dorsum right foot.  Return as needed  Follow-Up Instructions: Return if symptoms worsen or fail to improve.   Orders:  Orders Placed This Encounter  Procedures  . Small Joint Inj: R intertarsal   Meds ordered this encounter  Medications  . HYDROcodone-acetaminophen (NORCO/VICODIN) 5-325 MG tablet    Sig: Take 1 tablet by mouth every 6 (six) hours as needed for moderate pain.    Dispense:  30 tablet    Refill:  0      Procedures: Small Joint Inj: R intertarsal on 09/03/2018 8:15 AM Details: 27 G needle, dorsal approach  Spinal Needle: No  Medications: 1 mL lidocaine 1 %; 1 mL bupivacaine 0.5 %; 40 mg methylPREDNISolone acetate 40 MG/ML      Clinical Data: No additional findings.   Subjective: Chief Complaint  Patient presents with  . Follow-up    CYST ON R FOOT  Rosene has been followed recently for painful right foot.  She is had an MRI scan revealing some midfoot degenerative changes.  She also has a ganglion cyst in the area of her ankle which appears to be asymptomatic.  Injected the dorsal lateral aspect of her foot several weeks ago with excellent relief and now she is having pain in the mid dorsum of her foot no recurrent injury or trauma  HPI  Review of Systems  Constitutional: Negative for fatigue and fever.  HENT: Negative for ear pain.   Eyes: Negative for pain.  Respiratory: Negative for cough and shortness of breath.   Cardiovascular: Negative for leg swelling.  Gastrointestinal: Negative for constipation and diarrhea.  Genitourinary: Negative for  difficulty urinating.  Musculoskeletal: Negative for back pain and neck pain.  Skin: Negative for rash.  Allergic/Immunologic: Negative for food allergies.  Neurological: Positive for weakness. Negative for numbness.  Hematological: Does not bruise/bleed easily.  Psychiatric/Behavioral: Positive for sleep disturbance.     Objective: Vital Signs: BP 120/78 (BP Location: Left Arm, Patient Position: Sitting, Cuff Size: Normal)   Pulse 65   Ht 5\' 5"  (1.651 m)   Wt 168 lb (76.2 kg)   LMP 01/23/2015 Comment: perimenopausal (very irregular), no chance pregnant  BMI 27.96 kg/m   Physical Exam  Ortho Exam right foot exam without edema.  The area of ganglion formation about the ankle was asymptomatic.  There were several areas of local tenderness in the mid dorsum of the right foot corresponding to the tarsal metatarsal region.  Skin intact neurovascular exam intact some bony protuberance related to the arthritis.  Neurologically intact Specialty Comments:  No specialty comments available.  Imaging: No results found.   PMFS History: Patient Active Problem List   Diagnosis Date Noted  . Unilateral primary osteoarthritis, left knee 01/21/2017  . S/P total knee replacement using cement, left 01/21/2017  . Chondromalacia patellae, left knee 01/02/2017  . Torn meniscus 06/29/2015  . Osteochondral defect of femoral condyle 06/29/2015  . Plantar  fasciitis of right foot 09/27/2014   Past Medical History:  Diagnosis Date  . Anxiety   . Arthritis   . Bell's palsy 2016   left side   . 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 Depression   . Hypercholesteremia   . PONV (postoperative nausea and vomiting)    would like scope patch    History reviewed. No pertinent family history.  Past Surgical History:  Procedure Laterality Date  . AUGMENTATION MAMMAPLASTY Bilateral    placed 14 years ago   . CHONDROPLASTY Left 06/29/2015   Procedure: CHONDROPLASTY;  Surgeon:  Garald Balding, MD;  Location: [facility];  Service: Orthopedics;  Laterality: Left;  . FOOT SURGERY Bilateral    Bone Spurs  . KNEE ARTHROSCOPY WITH DRILLING/MICROFRACTURE Left 06/29/2015   Procedure: KNEE ARTHROSCOPY WITH DRILLING/MICROFRACTURE;  Surgeon: Garald Balding, MD;  Location: [facility];  Service: Orthopedics;  Laterality: Left;  . KNEE ARTHROSCOPY WITH LATERAL MENISECTOMY Left 06/29/2015   Procedure: KNEE ARTHROSCOPY WITH LATERAL MENISECTOMY;  Surgeon: Garald Balding, MD;  Location: [facility];  Service: Orthopedics;  Laterality: Left;  . KNEE ARTHROSCOPY WITH LATERAL RELEASE Left 06/29/2015   Procedure: KNEE ARTHROSCOPY WITH LATERAL RELEASE;  Surgeon: Garald Balding, MD;  Location: [facility];  Service: Orthopedics;  Laterality: Left;  . KNEE SURGERY    . Mohls Left    face  . PLANTAR FASCIA RELEASE Right 09/27/2014   Procedure: PLANTAR FASCIA RELEASE;  Surgeon: Garald Balding, MD;  Location: [facility];  Service: Orthopedics;  Laterality: Right;  . TONSILLECTOMY    . TOTAL KNEE ARTHROPLASTY Left 01/21/2017   Procedure: TOTAL KNEE ARTHROPLASTY;  Surgeon: Garald Balding, MD;  Location: [facility];  Service: Orthopedics;  Laterality: Left;  . Tummy tuck     2010   Social History   Occupational History  . Not on file  Tobacco Use  . Smoking status: Never Smoker  . Smokeless tobacco: Never Used  Substance and Sexual Activity  . Alcohol use: No  . Drug use: No  . Sexual activity: Not Currently

## 2018-10-26 ENCOUNTER — Ambulatory Visit (INDEPENDENT_AMBULATORY_CARE_PROVIDER_SITE_OTHER): Payer: BLUE CROSS/BLUE SHIELD | Admitting: Orthopaedic Surgery

## 2018-10-26 ENCOUNTER — Encounter (INDEPENDENT_AMBULATORY_CARE_PROVIDER_SITE_OTHER): Payer: Self-pay | Admitting: Orthopaedic Surgery

## 2018-10-26 VITALS — BP 103/76 | HR 84 | Ht 65.0 in | Wt 168.0 lb

## 2018-10-26 DIAGNOSIS — M79671 Pain in right foot: Secondary | ICD-10-CM | POA: Diagnosis not present

## 2018-10-26 DIAGNOSIS — M25562 Pain in left knee: Secondary | ICD-10-CM

## 2018-10-26 DIAGNOSIS — G8929 Other chronic pain: Secondary | ICD-10-CM

## 2018-10-26 MED ORDER — METHYLPREDNISOLONE ACETATE 40 MG/ML IJ SUSP
40.0000 mg | INTRAMUSCULAR | Status: AC | PRN
  Administered 2018-10-26: 40 mg via INTRAMUSCULAR

## 2018-10-26 MED ORDER — BUPIVACAINE HCL 0.5 % IJ SOLN
2.0000 mL | INTRAMUSCULAR | Status: AC | PRN
  Administered 2018-10-26: 2 mL via INTRA_ARTICULAR

## 2018-10-26 MED ORDER — METHYLPREDNISOLONE ACETATE 40 MG/ML IJ SUSP
40.0000 mg | INTRAMUSCULAR | Status: AC | PRN
  Administered 2018-10-26: 40 mg via INTRA_ARTICULAR

## 2018-10-26 MED ORDER — LIDOCAINE HCL 1 % IJ SOLN
2.0000 mL | INTRAMUSCULAR | Status: AC | PRN
  Administered 2018-10-26: 2 mL

## 2018-10-26 MED ORDER — METHYLPREDNISOLONE ACETATE 40 MG/ML IJ SUSP
80.0000 mg | INTRAMUSCULAR | Status: AC | PRN
  Administered 2018-10-26: 80 mg

## 2018-10-26 MED ORDER — LIDOCAINE HCL 1 % IJ SOLN
1.0000 mL | INTRAMUSCULAR | Status: AC | PRN
  Administered 2018-10-26: 1 mL

## 2018-10-26 NOTE — Progress Notes (Signed)
Office Visit Note   Patient: [name] [name]           Date of Birth: 30-Nov-1966           MRN: [id number] Visit Date: 10/26/2018              Requested by: Frazier Butt., MD [address] [place], [place] [address] PCP: Frazier Butt., MD   Assessment & Plan: Visit Diagnoses:  1. Right foot pain   2. Chronic pain of left knee     Plan: Recurrent tenderness dorsal lateral aspect right foot probably related to arthritic changes noted by MRI scan.  Will inject trigger point tenderness near base of fifth metatarsal.  Also having recurrent pain along the anterior medial proximal left tibia beneath her knee replacement.  Will inject that area with cortisone as well  Follow-Up Instructions: Return if symptoms worsen or fail to improve.   Orders:  Orders Placed This Encounter  Procedures  . Trigger Point Inj  . Large Joint Inj: L knee   No orders of the defined types were placed in this encounter.     Procedures: Trigger Point Inj Date/Time: 10/26/2018 8:59 AM Performed by: Garald Balding, MD Authorized by: Garald Balding, MD   Consent Given by:  Patient Indications:  Pain Total # of Trigger Points:  1 Location: lower extremity   Needle Size:  27 G Approach:  Lateral and dorsal Medications #1:  1 mL lidocaine 1 %; 40 mg methylPREDNISolone acetate 40 MG/ML Comments: 20 mg depo  Large Joint Inj: L knee on 10/26/2018 9:01 AM Indications: pain and diagnostic evaluation Details: 25 G 1.5 in needle, anteromedial approach  Arthrogram: No  Medications: 2 mL lidocaine 1 %; 2 mL bupivacaine 0.5 %; 80 mg methylPREDNISolone acetate 40 MG/ML; 40 mg methylPREDNISolone acetate 40 MG/ML  Injected trigger point arrea ant med knee below joint line Procedure, treatment alternatives, risks and benefits explained, specific risks discussed. Consent was given by the patient. Patient was prepped and draped in the usual sterile fashion.       Clinical Data: No additional  findings.   Subjective: Chief Complaint  Patient presents with  . Follow-up    R FOOT INJECTION 07/31/18   [name] [name] is experiencing recurrent pain in the dorsal lateral aspect of her right foot that was noted to have localized area of arthritis and ganglion cyst formation by prior MRI scan.  Has done well with cortisone in the past.  Also experiencing some recurrent pain along the anterior medial aspect left proximal tibia beneath her prosthesis. no injury or trauma.  No feveror chills  HPI  Review of Systems  Constitutional: Negative for fatigue and fever.  HENT: Negative for ear pain.   Eyes: Negative for pain.  Respiratory: Negative for cough and shortness of breath.   Cardiovascular: Negative for leg swelling.  Gastrointestinal: Negative for constipation and diarrhea.  Genitourinary: Negative for difficulty urinating.  Musculoskeletal: Negative for back pain and neck pain.  Skin: Negative for rash.  Allergic/Immunologic: Negative for food allergies.  Neurological: Positive for weakness. Negative for numbness.  Hematological: Does not bruise/bleed easily.  Psychiatric/Behavioral: Positive for sleep disturbance.     Objective: Vital Signs: BP 103/76 (BP Location: Left Arm, Patient Position: Sitting, Cuff Size: Normal)   Pulse 84   Ht 5\' 5"  (1.651 m)   Wt 168 lb (76.2 kg)   LMP 01/23/2015 Comment: perimenopausal (very irregular), no chance pregnant  BMI 27.96 kg/m  Physical Exam  Constitutional: She is oriented to person, place, and time. She appears well-developed and well-nourished.  HENT:  Mouth/Throat: Oropharynx is clear and moist.  Eyes: Pupils are equal, round, and reactive to light. EOM are normal.  Pulmonary/Chest: Effort normal.  Neurological: She is alert and oriented to person, place, and time.  Skin: Skin is warm and dry.  Psychiatric: She has a normal mood and affect. Her behavior is normal.    Ortho Exam right foot exam with some local tenderness near the  base tarsal.  No skin changes.  No crepitation.  Neurovascular exam intact.  Local tenderness along the anterior medial left tibia.  No masses.  Pain similar to area of prior pain that responded to cortisone.  No knee effusion no pain with patella compression or evidence of instability Specialty Comments:  No specialty comments available.  Imaging: No results found.   PMFS History: Patient Active Problem List   Diagnosis Date Noted  . Unilateral primary osteoarthritis, left knee 01/21/2017  . S/P total knee replacement using cement, left 01/21/2017  . Chondromalacia patellae, left knee 01/02/2017  . Torn meniscus 06/29/2015  . Osteochondral defect of femoral condyle 06/29/2015  . Plantar fasciitis of right foot 09/27/2014   Past Medical History:  Diagnosis Date  . Anxiety   . Arthritis   . Bell's palsy 2016   left side   . Cancer ([place])    skin cancer- left temporal " sitting on bone"  . Depression   . Hypercholesteremia   . PONV (postoperative nausea and vomiting)    would like scope patch    History reviewed. No pertinent family history.  Past Surgical History:  Procedure Laterality Date  . AUGMENTATION MAMMAPLASTY Bilateral    placed 14 years ago   . CHONDROPLASTY Left 06/29/2015   Procedure: CHONDROPLASTY;  Surgeon: Garald Balding, MD;  Location: [facility];  Service: Orthopedics;  Laterality: Left;  . FOOT SURGERY Bilateral    Bone Spurs  . KNEE ARTHROSCOPY WITH DRILLING/MICROFRACTURE Left 06/29/2015   Procedure: KNEE ARTHROSCOPY WITH DRILLING/MICROFRACTURE;  Surgeon: Garald Balding, MD;  Location: [facility];  Service: Orthopedics;  Laterality: Left;  . KNEE ARTHROSCOPY WITH LATERAL MENISECTOMY Left 06/29/2015   Procedure: KNEE ARTHROSCOPY WITH LATERAL MENISECTOMY;  Surgeon: Garald Balding, MD;  Location: [facility];  Service: Orthopedics;  Laterality: Left;  . KNEE ARTHROSCOPY WITH LATERAL RELEASE Left 06/29/2015    Procedure: KNEE ARTHROSCOPY WITH LATERAL RELEASE;  Surgeon: Garald Balding, MD;  Location: [facility];  Service: Orthopedics;  Laterality: Left;  . KNEE SURGERY    . Mohls Left    face  . PLANTAR FASCIA RELEASE Right 09/27/2014   Procedure: PLANTAR FASCIA RELEASE;  Surgeon: Garald Balding, MD;  Location: [facility];  Service: Orthopedics;  Laterality: Right;  . TONSILLECTOMY    . TOTAL KNEE ARTHROPLASTY Left 01/21/2017   Procedure: TOTAL KNEE ARTHROPLASTY;  Surgeon: Garald Balding, MD;  Location: [facility];  Service: Orthopedics;  Laterality: Left;  . Tummy tuck     2010   Social History   Occupational History  . Not on file  Tobacco Use  . Smoking status: Never Smoker  . Smokeless tobacco: Never Used  Substance and Sexual Activity  . Alcohol use: No  . Drug use: No  . Sexual activity: Not Currently

## 2018-10-30 ENCOUNTER — Telehealth (INDEPENDENT_AMBULATORY_CARE_PROVIDER_SITE_OTHER): Payer: Self-pay | Admitting: *Deleted

## 2018-10-30 NOTE — Telephone Encounter (Signed)
Patient requesting return call regarding daughter's MRI results, Bubba Hales, and discuss surgery. I suggested to patient to schedule appt with Dr. Marlou Sa.

## 2018-10-30 NOTE — Telephone Encounter (Signed)
Per our discussion- consult Dr Marlou Sa for second opinion next week-thanks

## 2018-11-27 ENCOUNTER — Ambulatory Visit (INDEPENDENT_AMBULATORY_CARE_PROVIDER_SITE_OTHER): Payer: BLUE CROSS/BLUE SHIELD | Admitting: Orthopaedic Surgery

## 2018-11-27 ENCOUNTER — Encounter (INDEPENDENT_AMBULATORY_CARE_PROVIDER_SITE_OTHER): Payer: Self-pay | Admitting: Orthopaedic Surgery

## 2018-11-27 VITALS — BP 108/76 | HR 71 | Resp 16 | Ht 65.0 in

## 2018-11-27 DIAGNOSIS — M79671 Pain in right foot: Secondary | ICD-10-CM | POA: Diagnosis not present

## 2018-11-27 MED ORDER — HYDROCODONE-ACETAMINOPHEN 5-325 MG PO TABS: 1.0000 | ORAL_TABLET | Freq: Four times a day (QID) | ORAL | 0 refills | Status: DC | PRN

## 2018-11-27 NOTE — Progress Notes (Signed)
Office Visit Note   Patient: [name] [name]           Date of Birth: 08-07-1966           MRN: [id number] Visit Date: 11/27/2018              Requested by: Frazier Butt., MD [address] [place], [place] [address] PCP: Frazier Butt., MD   Assessment & Plan: Visit Diagnoses:  1. Right foot pain     Plan: Carlina is been experiencing recurrent pain in her right foot as previously outlined.  She said several cortisone injections with only temporary relief of her pain.  She had an MRI scan performed in August without any obvious abnormality other than a ganglion cyst beneath the extensor digitorum longus tendons in the midfoot that has not been particularly uncomfortable.  Her pain is more localized along the lateral aspect of her foot near the base of the fifth metatarsal.  There is several diagnostic possibilities.  She does have an os perineum which might predispose her to a peroneal tendinitis.  Her pain is out of proportion to what I would expect.  I am in a place her in an equalizer boot.  We talked for probably 30 minutes regarding her problem hopefully a period of immobilization will make a difference plan to see her back in a month Follow-Up Instructions: Return if symptoms worsen or fail to improve.   Orders:  No orders of the defined types were placed in this encounter.  Meds ordered this encounter  Medications  . HYDROcodone-acetaminophen (NORCO/VICODIN) 5-325 MG tablet    Sig: Take 1 tablet by mouth every 6 (six) hours as needed for moderate pain.    Dispense:  30 tablet    Refill:  0      Procedures: No procedures performed   Clinical Data: No additional findings.   Subjective: Chief Complaint  Patient presents with  . Right Foot - Pain  . Foot Pain    Right foot pain x 07/2018, worsening since last injection, difficulty walking, difficulty sleeping at night, swelling, no injury, bone spur removed years ago, not diabetic  Seen a month ago for evaluation  of her right foot pain.  At the time I injected an area along the base of the fifth metatarsal which gives her some temporary relief of her pain only to have it recur.  She notes that it is "miserable".  She is on her feet all day long.  She thinks she is some swelling at the end of the day.  She is also noted to have a ganglion cyst beneath the extensor digitorum longus in the midfoot dorsally which does not appear to be that symptomatic.  She is not had any numbness or tingling.  There is no history of injury or trauma.  MRI scan did not demonstrate any particular abnormality in the area of her pain is near the base of the fifth metatarsal  HPI  Review of Systems  Constitutional: Negative for fatigue.  HENT: Negative for trouble swallowing.   Eyes: Negative for pain.  Respiratory: Negative for shortness of breath.   Cardiovascular: Negative for leg swelling.  Gastrointestinal: Negative for constipation.  Endocrine: Negative for cold intolerance.  Genitourinary: Negative for difficulty urinating.  Musculoskeletal: Positive for gait problem and joint swelling.  Skin: Negative for rash.  Allergic/Immunologic: Negative for food allergies.  Neurological: Negative for numbness.  Hematological: Does not bruise/bleed easily.  Psychiatric/Behavioral: Positive for sleep disturbance.  Objective: Vital Signs: BP 108/76 (BP Location: Left Arm, Patient Position: Sitting, Cuff Size: Normal)   Pulse 71   Resp 16   Ht 5\' 5"  (1.651 m)   LMP 01/23/2015 Comment: perimenopausal (very irregular), no chance pregnant  BMI 27.96 kg/m   Physical Exam  Constitutional: She is oriented to person, place, and time. She appears well-developed and well-nourished.  HENT:  Mouth/Throat: Oropharynx is clear and moist.  Eyes: Pupils are equal, round, and reactive to light. EOM are normal.  Pulmonary/Chest: Effort normal.  Neurological: She is alert and oriented to person, place, and time.  Skin: Skin is warm  and dry.  Psychiatric: She has a normal mood and affect. Her behavior is normal.    Ortho Exam awake alert and oriented x3.  Cried throughout the office visit.  Pain is localized to the right foot near the base of the fifth metatarsal.  No skin changes.  No erythema or ecchymosis.  No Tinel's.  Ganglion cyst is identified the dorsum of her foot but not tender.  No pain along the peroneal tendon behind the lateral malleolus.  Vascular exam intact.  No forefoot pain.  No pain in the toes.  No heel pain.  No Achilles tendon discomfort.  No swelling along the peroneal tendons. Specialty Comments:  No specialty comments available.  Imaging: No results found.   PMFS History: Patient Active Problem List   Diagnosis Date Noted  . Unilateral primary osteoarthritis, left knee 01/21/2017  . S/P total knee replacement using cement, left 01/21/2017  . Chondromalacia patellae, left knee 01/02/2017  . Torn meniscus 06/29/2015  . Osteochondral defect of femoral condyle 06/29/2015  . Plantar fasciitis of right foot 09/27/2014   Past Medical History:  Diagnosis Date  . Anxiety   . Arthritis   . Bell's palsy 2016   left side   . 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 Depression   . Hypercholesteremia   . PONV (postoperative nausea and vomiting)    would like scope patch    History reviewed. No pertinent family history.  Past Surgical History:  Procedure Laterality Date  . AUGMENTATION MAMMAPLASTY Bilateral    placed 14 years ago   . CHONDROPLASTY Left 06/29/2015   Procedure: CHONDROPLASTY;  Surgeon: Garald Balding, MD;  Location: [facility];  Service: Orthopedics;  Laterality: Left;  . FOOT SURGERY Bilateral    Bone Spurs  . KNEE ARTHROSCOPY WITH DRILLING/MICROFRACTURE Left 06/29/2015   Procedure: KNEE ARTHROSCOPY WITH DRILLING/MICROFRACTURE;  Surgeon: Garald Balding, MD;  Location: [facility];  Service: Orthopedics;  Laterality:  Left;  . KNEE ARTHROSCOPY WITH LATERAL MENISECTOMY Left 06/29/2015   Procedure: KNEE ARTHROSCOPY WITH LATERAL MENISECTOMY;  Surgeon: Garald Balding, MD;  Location: [facility];  Service: Orthopedics;  Laterality: Left;  . KNEE ARTHROSCOPY WITH LATERAL RELEASE Left 06/29/2015   Procedure: KNEE ARTHROSCOPY WITH LATERAL RELEASE;  Surgeon: Garald Balding, MD;  Location: [facility];  Service: Orthopedics;  Laterality: Left;  . KNEE SURGERY    . Mohls Left    face  . PLANTAR FASCIA RELEASE Right 09/27/2014   Procedure: PLANTAR FASCIA RELEASE;  Surgeon: Garald Balding, MD;  Location: [facility];  Service: Orthopedics;  Laterality: Right;  . TONSILLECTOMY    . TOTAL KNEE ARTHROPLASTY Left 01/21/2017   Procedure: TOTAL KNEE ARTHROPLASTY;  Surgeon: Garald Balding, MD;  Location: [facility];  Service:  Orthopedics;  Laterality: Left;  . Tummy tuck     2010   Social History   Occupational History  . Not on file  Tobacco Use  . Smoking status: Never Smoker  . Smokeless tobacco: Never Used  Substance and Sexual Activity  . Alcohol use: No  . Drug use: No  . Sexual activity: Not Currently

## 2019-02-24 ENCOUNTER — Ambulatory Visit (INDEPENDENT_AMBULATORY_CARE_PROVIDER_SITE_OTHER): Payer: BLUE CROSS/BLUE SHIELD | Admitting: Orthopaedic Surgery

## 2019-02-25 ENCOUNTER — Encounter (INDEPENDENT_AMBULATORY_CARE_PROVIDER_SITE_OTHER): Payer: Self-pay | Admitting: Orthopaedic Surgery

## 2019-02-25 ENCOUNTER — Ambulatory Visit (INDEPENDENT_AMBULATORY_CARE_PROVIDER_SITE_OTHER): Payer: BLUE CROSS/BLUE SHIELD | Admitting: Orthopaedic Surgery

## 2019-02-25 DIAGNOSIS — M79671 Pain in right foot: Secondary | ICD-10-CM | POA: Diagnosis not present

## 2019-02-25 MED ORDER — METHYLPREDNISOLONE ACETATE 40 MG/ML IJ SUSP
40.0000 mg | INTRAMUSCULAR | Status: AC | PRN
  Administered 2019-02-25: 40 mg via INTRA_ARTICULAR

## 2019-02-25 MED ORDER — LIDOCAINE HCL 1 % IJ SOLN
1.0000 mL | INTRAMUSCULAR | Status: AC | PRN
  Administered 2019-02-25: 1 mL

## 2019-02-25 NOTE — Progress Notes (Signed)
Office Visit Note   Patient: [name] [name]           Date of Birth: 25-Dec-1965           MRN: [id number] Visit Date: 02/25/2019              Requested by: Frazier Butt., MD [address] [place], [place] [address] PCP: Frazier Butt., MD   Assessment & Plan: Visit Diagnoses:  1. Right foot pain     Plan: Recurrent areas of tenderness near the base of the right fifth metatarsal in the mid intertarsal region.  Prior MRI scan demonstrated a large and cyst which appears to be asymptomatic.  Has had prior cortisone injections that gave her temporary relief.  Has been wearing a boot.  No recurrent injury or trauma.  Will reinject those areas of tenderness with cortisone return as needed.  No meds Follow-Up Instructions: Return if symptoms worsen or fail to improve.   Orders:  Orders Placed This Encounter  Procedures  . Small Joint Inj: R intertarsal   No orders of the defined types were placed in this encounter.     Procedures: Small Joint Inj: R intertarsal on 02/25/2019 4:59 PM Details: 27 G needle, dorsal approach Medications: 1 mL lidocaine 1 %; 40 mg methylPREDNISolone acetate 40 MG/ML      Clinical Data: No additional findings.   Subjective: Chief Complaint  Patient presents with  . Right Foot - Follow-up  Patient presents for follow up on her right foot. She was here three months ago and given an equalizer boot to wear.  Works on her feet during the day.  Has had recurrent pain in the same area that she has had before i.e. at the base of the fifth metatarsal right foot and in the mid intertarsal region.  No injury or trauma.  No ecchymosis or erythema.  No numbness or tingling  HPI  Review of Systems   Objective: Vital Signs: LMP 01/23/2015 Comment: perimenopausal (very irregular), no chance pregnant  Physical Exam  Ortho Exam right foot without swelling other than the ganglion cyst which appears to be smaller beneath the extensor digitorum longus.  No  medial lateral ankle pain.  No posterior heel pain or plantar pain.  Has pain with directly over the base of the fifth metatarsal.  Peroneus brevis and longus intact.  Also has an area of discomfort in the mid dorsum of the foot near the intertarsal region.  Specialty Comments:  No specialty comments available.  Imaging: No results found.   PMFS History: Patient Active Problem List   Diagnosis Date Noted  . Right foot pain 02/25/2019  . Unilateral primary osteoarthritis, left knee 01/21/2017  . S/P total knee replacement using cement, left 01/21/2017  . Chondromalacia patellae, left knee 01/02/2017  . Torn meniscus 06/29/2015  . Osteochondral defect of femoral condyle 06/29/2015  . Plantar fasciitis of right foot 09/27/2014   Past Medical History:  Diagnosis Date  . Anxiety   . Arthritis   . Bell's palsy 2016   left side   . 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 Depression   . Hypercholesteremia   . PONV (postoperative nausea and vomiting)    would like scope patch    History reviewed. No pertinent family history.  Past Surgical History:  Procedure Laterality Date  . AUGMENTATION MAMMAPLASTY Bilateral    placed 14 years ago   . CHONDROPLASTY Left  06/29/2015   Procedure: CHONDROPLASTY;  Surgeon: Garald Balding, MD;  Location: [facility];  Service: Orthopedics;  Laterality: Left;  . FOOT SURGERY Bilateral    Bone Spurs  . KNEE ARTHROSCOPY WITH DRILLING/MICROFRACTURE Left 06/29/2015   Procedure: KNEE ARTHROSCOPY WITH DRILLING/MICROFRACTURE;  Surgeon: Garald Balding, MD;  Location: [facility];  Service: Orthopedics;  Laterality: Left;  . KNEE ARTHROSCOPY WITH LATERAL MENISECTOMY Left 06/29/2015   Procedure: KNEE ARTHROSCOPY WITH LATERAL MENISECTOMY;  Surgeon: Garald Balding, MD;  Location: [facility];  Service: Orthopedics;  Laterality: Left;  . KNEE ARTHROSCOPY WITH LATERAL RELEASE Left 06/29/2015    Procedure: KNEE ARTHROSCOPY WITH LATERAL RELEASE;  Surgeon: Garald Balding, MD;  Location: [facility];  Service: Orthopedics;  Laterality: Left;  . KNEE SURGERY    . Mohls Left    face  . PLANTAR FASCIA RELEASE Right 09/27/2014   Procedure: PLANTAR FASCIA RELEASE;  Surgeon: Garald Balding, MD;  Location: [facility];  Service: Orthopedics;  Laterality: Right;  . TONSILLECTOMY    . TOTAL KNEE ARTHROPLASTY Left 01/21/2017   Procedure: TOTAL KNEE ARTHROPLASTY;  Surgeon: Garald Balding, MD;  Location: [facility];  Service: Orthopedics;  Laterality: Left;  . Tummy tuck     2010   Social History   Occupational History  . Not on file  Tobacco Use  . Smoking status: Never Smoker  . Smokeless tobacco: Never Used  Substance and Sexual Activity  . Alcohol use: No  . Drug use: No  . Sexual activity: Not Currently

## 2019-05-18 ENCOUNTER — Encounter: Payer: Self-pay | Admitting: Orthopaedic Surgery

## 2019-05-18 ENCOUNTER — Other Ambulatory Visit: Payer: Self-pay

## 2019-05-18 ENCOUNTER — Ambulatory Visit: Payer: BLUE CROSS/BLUE SHIELD | Admitting: Orthopaedic Surgery

## 2019-05-18 VITALS — BP 111/77 | HR 83 | Ht 65.0 in | Wt 168.0 lb

## 2019-05-18 DIAGNOSIS — M79671 Pain in right foot: Secondary | ICD-10-CM | POA: Diagnosis not present

## 2019-05-18 NOTE — Progress Notes (Signed)
Office Visit Note   Patient: [name] [name]           Date of Birth: [birth year]           MRN: [id number] Visit Date: 05/18/2019              Requested by: Frazier Butt., MD [address] [place], [place] [address] PCP: Frazier Butt., MD   Assessment & Plan: Visit Diagnoses:  1. Right foot pain     Plan: Persistent right foot pain for probably well over a year.  Has not done well with local cortisone injections and use of an equalizer boot.  Pain is localized near the base of the fifth metatarsal MRI scan was also a prominent ganglion cyst deep and medial to the extensor digitorum longus tendon.  [name] is miserable and having a difficult time walking and performing her activities of daily living.  She would like to have "surgery".  The problem is localized to the base of the fifth metatarsal and certainly could have an insertional tendinitis.  She probably has some arthritic change at the midfoot.  There also is some evidence of fluid in the peroneal tendon sheath but does not really have any pain along the course of the tendon except near its attachment the base of the fifth metatarsal.  The ganglion cyst beneath the extensor digitorum longus is prominent but not tender.  Before considering any surgery I would like Dr. Sharol Given to evaluate.  I am not sure the ganglion cyst is symptomatic but it may be worth decompressing that and exploring the distal portion of the peroneal tendon.  Follow-Up Instructions: No follow-ups on file.   Orders:  No orders of the defined types were placed in this encounter.  No orders of the defined types were placed in this encounter.     Procedures: No procedures performed   Clinical Data: No additional findings.   Subjective: Chief Complaint  Patient presents with  . Right Foot - Pain  Patient presents today for follow up on her right foot. She was here almost 3 months ago and received an intertarsal cortisone injection. Patient states that  the injection did not help and she is ready to pursue surgery.  Has been using over-the-counter medicines and equalizer boot without much relief.  Pain is localized near the base of the fifth metatarsal.  Had prior MRI scan in August 2019 without specific pathology at the base of the fifth metatarsal.  Prior films of her foot about a year ago did not demonstrate an os peroneum or any specific changes near the base of the fifth metatarsal  HPI  Review of Systems   Objective: Vital Signs: BP 111/77   Pulse 83   Ht 5\' 5"  (1.651 m)   Wt 168 lb (76.2 kg)   LMP 01/23/2015 Comment: perimenopausal (very irregular), no chance pregnant  BMI 27.96 kg/m   Physical Exam Constitutional:      Appearance: She is well-developed.  Eyes:     Pupils: Pupils are equal, round, and reactive to light.  Pulmonary:     Effort: Pulmonary effort is normal.  Skin:    General: Skin is warm and dry.  Neurological:     Mental Status: She is alert and oriented to person, place, and time.  Psychiatric:        Behavior: Behavior normal.     Ortho Exam right foot with local tenderness at the base of the fifth metatarsal.  No  skin changes.  There is a ganglion cyst beneath the extensor digitorum longus near the midfoot but it is not red or tender.  Some fluid within the peroneal tendon sheath but no pain behind the lateral malleolus or along the course of the tendon until it attaches to the base of the fifth metatarsal.  The tendon is intact.  Does have some midfoot degenerative changes that are palpable but not tender.  Specialty Comments:  No specialty comments available.  Imaging: No results found.   PMFS History: Patient Active Problem List   Diagnosis Date Noted  . Right foot pain 02/25/2019  . Unilateral primary osteoarthritis, left knee 01/21/2017  . S/P total knee replacement using cement, left 01/21/2017  . Chondromalacia patellae, left knee 01/02/2017  . Torn meniscus 06/29/2015  .  Osteochondral defect of femoral condyle 06/29/2015  . Plantar fasciitis of right foot 09/27/2014   Past Medical History:  Diagnosis Date  . Anxiety   . Arthritis   . Bell's palsy 2016   left side   . Cancer ([place])    skin cancer- left temporal " sitting on bone"  . Depression   . Hypercholesteremia   . PONV (postoperative nausea and vomiting)    would like scope patch    History reviewed. No pertinent family history.  Past Surgical History:  Procedure Laterality Date  . AUGMENTATION MAMMAPLASTY Bilateral    placed 14 years ago   . CHONDROPLASTY Left 06/29/2015   Procedure: CHONDROPLASTY;  Surgeon: Garald Balding, MD;  Location: [facility];  Service: Orthopedics;  Laterality: Left;  . FOOT SURGERY Bilateral    Bone Spurs  . KNEE ARTHROSCOPY WITH DRILLING/MICROFRACTURE Left 06/29/2015   Procedure: KNEE ARTHROSCOPY WITH DRILLING/MICROFRACTURE;  Surgeon: Garald Balding, MD;  Location: [facility];  Service: Orthopedics;  Laterality: Left;  . KNEE ARTHROSCOPY WITH LATERAL MENISECTOMY Left 06/29/2015   Procedure: KNEE ARTHROSCOPY WITH LATERAL MENISECTOMY;  Surgeon: Garald Balding, MD;  Location: [facility];  Service: Orthopedics;  Laterality: Left;  . KNEE ARTHROSCOPY WITH LATERAL RELEASE Left 06/29/2015   Procedure: KNEE ARTHROSCOPY WITH LATERAL RELEASE;  Surgeon: Garald Balding, MD;  Location: [facility];  Service: Orthopedics;  Laterality: Left;  . KNEE SURGERY    . Mohls Left    face  . PLANTAR FASCIA RELEASE Right 09/27/2014   Procedure: PLANTAR FASCIA RELEASE;  Surgeon: Garald Balding, MD;  Location: [facility];  Service: Orthopedics;  Laterality: Right;  . TONSILLECTOMY    . TOTAL KNEE ARTHROPLASTY Left 01/21/2017   Procedure: TOTAL KNEE ARTHROPLASTY;  Surgeon: Garald Balding, MD;  Location: [facility];  Service: Orthopedics;  Laterality: Left;  . Tummy tuck     2010   Social History    Occupational History  . Not on file  Tobacco Use  . Smoking status: Never Smoker  . Smokeless tobacco: Never Used  Substance and Sexual Activity  . Alcohol use: No  . Drug use: No  . Sexual activity: Not Currently

## 2019-05-24 ENCOUNTER — Ambulatory Visit: Payer: BLUE CROSS/BLUE SHIELD | Admitting: Orthopedic Surgery

## 2019-05-26 ENCOUNTER — Ambulatory Visit: Payer: BLUE CROSS/BLUE SHIELD | Admitting: Orthopedic Surgery

## 2019-05-26 ENCOUNTER — Telehealth: Payer: Self-pay | Admitting: *Deleted

## 2019-05-26 NOTE — Telephone Encounter (Signed)
Patient has appt scheduled with Dr. Sharol Given 05/27/2019. Do you need to see her for a f/u appt after seeing Dr. Sharol Given? Your notes states no f/u appt needed. Please advise.

## 2019-05-27 ENCOUNTER — Encounter: Payer: Self-pay | Admitting: Orthopedic Surgery

## 2019-05-27 ENCOUNTER — Ambulatory Visit: Payer: BLUE CROSS/BLUE SHIELD | Admitting: Orthopedic Surgery

## 2019-05-27 ENCOUNTER — Other Ambulatory Visit: Payer: Self-pay

## 2019-05-27 VITALS — Ht 65.0 in | Wt 168.0 lb

## 2019-05-27 DIAGNOSIS — M7671 Peroneal tendinitis, right leg: Secondary | ICD-10-CM

## 2019-05-27 NOTE — Telephone Encounter (Signed)
Let me review his note before deciding

## 2019-06-01 ENCOUNTER — Encounter: Payer: Self-pay | Admitting: Orthopedic Surgery

## 2019-06-01 NOTE — Progress Notes (Signed)
Office Visit Note   Patient: [name] [name]           Date of Birth: May 23, 1966           MRN: [id number] Visit Date: 05/27/2019              Requested by: Frazier Butt., MD [address] [place], [place] [address] PCP: Frazier Butt., MD  Chief Complaint  Patient presents with   Right Foot - Pain      HPI: Patient is a 53 year old woman who is undergone excellent prolonged conservative therapy for peroneal tendinitis right foot.  Patient states she has had pain radiating along the peroneal tendons to the base of the fifth metatarsal for about a year.  She states she has pain with ambulation she states she is not able to perform her activities of daily living she states the pain continues to get worse and she has popping along the peroneal tendons.  She has been treated with a fracture boot with immobilization multiple steroid injections.  She most recently has had an MRI scan.    Assessment & Plan: Visit Diagnoses:  1. Peroneal tendinitis, right leg     Plan: Due to failure of conservative treatment have recommended that her next option would be to proceed with surgical debridement of the peroneal tendon possible reinforcement of the peroneus brevis with the peroneus longus.  Patient states that she now has insurance with [employer] [facility] and that she has to use a [facility] doctor.  We will make a referral to Dr. Len Childs and [facility].  Follow-Up Instructions: Return if symptoms worsen or fail to improve.   Ortho Exam  Patient is alert, oriented, no adenopathy, well-dressed, normal affect, normal respiratory effort. Examination patient has good pulses.  She has good ankle good subtalar motion she has tenderness to palpation along the peroneus brevis at its its insertion to the base of the fifth metatarsal.  Patient complains of pain with all activities of daily living.  Examination of the MRI scan shows intact peroneus longus and peroneus brevis there is fluid  around the peroneal tendons.  Patient also has some inflammation medially along the posterior tibial tendon and there is a cyst deep to the extensor digitorum longus tendon this is an area that is asymptomatic the posterior tibial tendon is also asymptomatic.  Imaging: No results found. No images are attached to the encounter.  Labs: Lab Results  Component Value Date   REPTSTATUS 01/22/2017 FINAL 01/21/2017   CULT NO GROWTH 01/21/2017     Lab Results  Component Value Date   ALBUMIN 4.0 01/21/2017   ALBUMIN 3.8 01/27/2014   ALBUMIN 3.7 03/23/2013    Body mass index is 27.96 kg/m.  Orders:  No orders of the defined types were placed in this encounter.  No orders of the defined types were placed in this encounter.    Procedures: No procedures performed  Clinical Data: No additional findings.  ROS:  All other systems negative, except as noted in the HPI. Review of Systems  Objective: Vital Signs: Ht 5\' 5"  (1.651 m)    Wt 168 lb (76.2 kg)    LMP 01/23/2015 Comment: perimenopausal (very irregular), no chance pregnant   BMI 27.96 kg/m   Specialty Comments:  No specialty comments available.  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 No pertinent family history.  Past Surgical History:  Procedure Laterality Date    AUGMENTATION MAMMAPLASTY Bilateral    placed 14 years ago    CHONDROPLASTY Left 06/29/2015   Procedure: CHONDROPLASTY;  Surgeon: Garald Balding, MD;  Location: [facility];  Service: Orthopedics;  Laterality: Left;   FOOT SURGERY Bilateral    Bone Spurs   KNEE ARTHROSCOPY WITH DRILLING/MICROFRACTURE Left 06/29/2015   Procedure: KNEE ARTHROSCOPY WITH DRILLING/MICROFRACTURE;  Surgeon: Garald Balding, MD;  Location: [facility];  Service: Orthopedics;  Laterality: Left;   KNEE ARTHROSCOPY WITH LATERAL MENISECTOMY Left 06/29/2015   Procedure: KNEE ARTHROSCOPY WITH LATERAL MENISECTOMY;  Surgeon: Garald Balding, MD;  Location: [facility];  Service: Orthopedics;  Laterality: Left;   KNEE ARTHROSCOPY WITH LATERAL RELEASE Left 06/29/2015   Procedure: KNEE ARTHROSCOPY WITH LATERAL RELEASE;  Surgeon: Garald Balding, MD;  Location: [facility];  Service: Orthopedics;  Laterality: Left;   KNEE SURGERY     Mohls Left    face   PLANTAR FASCIA RELEASE Right 09/27/2014   Procedure: PLANTAR FASCIA RELEASE;  Surgeon: Garald Balding, MD;  Location: [facility];  Service: Orthopedics;  Laterality: Right;   TONSILLECTOMY     TOTAL KNEE ARTHROPLASTY Left 01/21/2017   Procedure: TOTAL KNEE ARTHROPLASTY;  Surgeon: Garald Balding, MD;  Location: [facility];  Service: Orthopedics;  Laterality: Left;   Tummy tuck     2010   Social History   Occupational History   Not on file  Tobacco Use   Smoking status: Never Smoker   Smokeless tobacco: Never Used  Substance and Sexual Activity   Alcohol use: No   Drug use: No   Sexual activity: Not Currently

## 2019-06-14 ENCOUNTER — Telehealth: Payer: Self-pay | Admitting: Orthopaedic Surgery

## 2019-06-14 NOTE — Telephone Encounter (Signed)
Patient called requesting to speak with Dr. Durward Fortes directly regarding her surgery that is scheduled for Friday, 06/18/19.

## 2019-06-15 NOTE — Telephone Encounter (Signed)
called

## 2019-06-15 NOTE — Telephone Encounter (Signed)
Please see below.

## 2019-07-27 ENCOUNTER — Telehealth: Payer: Self-pay | Admitting: Orthopaedic Surgery

## 2019-07-27 NOTE — Telephone Encounter (Signed)
Please advise 

## 2019-07-27 NOTE — Telephone Encounter (Signed)
Options-copies of films brought to office on CD

## 2019-07-27 NOTE — Telephone Encounter (Signed)
Patient left a voicemail stating she was at Dr. Juluis Mire office this morning to get her cast off her foot and states he found something that Dr. Durward Fortes would be surprised about on an x-ray of her knee.  Patient states she would like to speak with Dr. Durward Fortes about it and also would like to send a picture to "someones" phone so he can see what she is talking about.   Patient is requesting a return call

## 2019-07-28 ENCOUNTER — Telehealth: Payer: Self-pay | Admitting: Orthopaedic Surgery

## 2019-07-28 NOTE — Telephone Encounter (Signed)
Patient left a voicemail stating "she is trying to get in touch with Dr. Durward Fortes" and requesting a return call.

## 2019-07-28 NOTE — Telephone Encounter (Signed)
Spoke with patient. She is going to come by one afternoon at the end of clinic to show Dr.Whitfield the copy of the x-ray on her phone. She states that she had an x-ray at Dr.Lennon's office and was told she had a screw floating around in her knee. She does not want to request a CD of her x-rays, and she does not want to come in for a consultation due being out of network now.

## 2019-12-20 ENCOUNTER — Telehealth: Payer: Self-pay | Admitting: Orthopaedic Surgery

## 2019-12-20 NOTE — Telephone Encounter (Signed)
Patient called in requesting a cortizone shot in left knee today  from Dr. Durward Fortes or [place]. Patient requesting a call back. If patient is unable to answer please leave detailed message. Patient phone number is [phone].

## 2019-12-20 NOTE — Telephone Encounter (Signed)
Patient called and scheduled for Thursday afternoon.

## 2019-12-23 ENCOUNTER — Other Ambulatory Visit: Payer: Self-pay

## 2019-12-23 ENCOUNTER — Encounter: Payer: Self-pay | Admitting: Orthopaedic Surgery

## 2019-12-23 ENCOUNTER — Ambulatory Visit: Payer: BLUE CROSS/BLUE SHIELD | Admitting: Orthopaedic Surgery

## 2019-12-23 VITALS — Ht 65.0 in | Wt 150.0 lb

## 2019-12-23 DIAGNOSIS — M1712 Unilateral primary osteoarthritis, left knee: Secondary | ICD-10-CM | POA: Diagnosis not present

## 2019-12-23 DIAGNOSIS — Z96652 Presence of left artificial knee joint: Secondary | ICD-10-CM | POA: Diagnosis not present

## 2019-12-23 MED ORDER — METHYLPREDNISOLONE ACETATE 40 MG/ML IJ SUSP
40.0000 mg | INTRAMUSCULAR | Status: AC | PRN
  Administered 2019-12-23: 40 mg via INTRAMUSCULAR

## 2019-12-23 MED ORDER — LIDOCAINE HCL 1 % IJ SOLN
2.0000 mL | INTRAMUSCULAR | Status: AC | PRN
  Administered 2019-12-23: 2 mL

## 2019-12-23 NOTE — Progress Notes (Signed)
Office Visit Note   Patient: [name] [name]           Date of Birth: [birth year]           MRN: [id number] Visit Date: 12/23/2019              Requested by: Frazier Butt., MD [address] [place],  [place] [address] PCP: Frazier Butt., MD   Assessment & Plan: Visit Diagnoses:  1. Unilateral primary osteoarthritis, left knee   2. S/P total knee replacement using cement, left     Plan: 2 years status post primary left total knee replacement has been doing well up until several weeks ago when she fell and has been having some discomfort along the medial lateral aspect of her left knee.  She localizes to specific areas.  Her knee exam was benign without evidence of ecchymosis or other skin changes.  There is no effusion and no instability.  I did not think x-rays were necessary.  I did inject the 2 areas of tenderness in the proximal tibia both medially and laterally and we will plan to see her back as needed  Follow-Up Instructions: Return if symptoms worsen or fail to improve.   Orders:  Orders Placed This Encounter  Procedures  . Trigger Point Inj   No orders of the defined types were placed in this encounter.     Procedures: Trigger Point Inj  Date/Time: 12/23/2019 4:29 PM Performed by: Garald Balding, MD Authorized by: Garald Balding, MD   Consent Given by:  Patient Indications:  Pain Total # of Trigger Points:  2 Approach:  Lateral and medial Medications #1:  2 mL lidocaine 1 %; 40 mg methylPREDNISolone acetate 40 MG/ML Comments: 2 areas of trigger point tenderness about the medial and lateral aspect of the left knee and the proximal tibia     Clinical Data: No additional findings.   Subjective: Chief Complaint  Patient presents with  . Left Knee - Pain  Patient presents today for recurrent left knee pain. Her last injection with with Dr.Lennon in [facility], but unsure when they was given. She said that it has been swelling and having  difficulty walking. Her knee buckled last year and caused her to fall. She has a total knee arthroplasty on the left side. She is taking Advil for pain.  2 years status post total knee arthroplasty and had a difficult postoperative course with pain.  Her pain subsided and she has been fairly functional without any problems until she had a recent fall and now is experiencing some local tenderness i.e. almost pinpoint tenderness along the proximal medial and lateral tibia.  No fever chills or feeling of instability of her left knee replacement.  Prior x-rays and follow-up MRI scan after knee replacement were negative for any complications  HPI  Review of Systems   Objective: Vital Signs: Ht 5\' 5"  (1.651 m)   Wt 150 lb (68 kg)   LMP 01/23/2015 Comment: perimenopausal (very irregular), no chance pregnant  BMI 24.96 kg/m   Physical Exam Constitutional:      Appearance: She is well-developed.  Eyes:     Pupils: Pupils are equal, round, and reactive to light.  Pulmonary:     Effort: Pulmonary effort is normal.  Skin:    General: Skin is warm and dry.  Neurological:     Mental Status: She is alert and oriented to person, place, and time.  Psychiatric:  Behavior: Behavior normal.     Ortho Exam left knee was not hot red warm or swollen.  No effusion.  No instability.  Full quick extension about 112 degrees of flexion with a goniometer.  No skin changes.  2 localized areas of tenderness along the medial and lateral tibial plateau below the joint line.  Distal edema.  Neurologically intact.  Straight leg raise.  No pain range of motion of either hip  Specialty Comments:  No specialty comments available.  Imaging: No results found.   PMFS History: Patient Active Problem List   Diagnosis Date Noted  . Right foot pain 02/25/2019  . Unilateral primary osteoarthritis, left knee 01/21/2017  . S/P total knee replacement using cement, left 01/21/2017  . Chondromalacia patellae, left  knee 01/02/2017  . Torn meniscus 06/29/2015  . Osteochondral defect of femoral condyle 06/29/2015  . Plantar fasciitis of right foot 09/27/2014   Past Medical History:  Diagnosis Date  . Anxiety   . Arthritis   . Bell's palsy 2016   left side   . 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 Depression   . Hypercholesteremia   . PONV (postoperative nausea and vomiting)    would like scope patch    History reviewed. No pertinent family history.  Past Surgical History:  Procedure Laterality Date  . AUGMENTATION MAMMAPLASTY Bilateral    placed 14 years ago   . CHONDROPLASTY Left 06/29/2015   Procedure: CHONDROPLASTY;  Surgeon: Garald Balding, MD;  Location: [facility];  Service: Orthopedics;  Laterality: Left;  . FOOT SURGERY Bilateral    Bone Spurs  . KNEE ARTHROSCOPY WITH DRILLING/MICROFRACTURE Left 06/29/2015   Procedure: KNEE ARTHROSCOPY WITH DRILLING/MICROFRACTURE;  Surgeon: Garald Balding, MD;  Location: [facility];  Service: Orthopedics;  Laterality: Left;  . KNEE ARTHROSCOPY WITH LATERAL MENISECTOMY Left 06/29/2015   Procedure: KNEE ARTHROSCOPY WITH LATERAL MENISECTOMY;  Surgeon: Garald Balding, MD;  Location: [facility];  Service: Orthopedics;  Laterality: Left;  . KNEE ARTHROSCOPY WITH LATERAL RELEASE Left 06/29/2015   Procedure: KNEE ARTHROSCOPY WITH LATERAL RELEASE;  Surgeon: Garald Balding, MD;  Location: [facility];  Service: Orthopedics;  Laterality: Left;  . KNEE SURGERY    . Mohls Left    face  . PLANTAR FASCIA RELEASE Right 09/27/2014   Procedure: PLANTAR FASCIA RELEASE;  Surgeon: Garald Balding, MD;  Location: [facility];  Service: Orthopedics;  Laterality: Right;  . TONSILLECTOMY    . TOTAL KNEE ARTHROPLASTY Left 01/21/2017   Procedure: TOTAL KNEE ARTHROPLASTY;  Surgeon: Garald Balding, MD;  Location: [facility];  Service: Orthopedics;  Laterality: Left;  . Tummy  tuck     2010   Social History   Occupational History  . Not on file  Tobacco Use  . Smoking status: Never Smoker  . Smokeless tobacco: Never Used  Substance and Sexual Activity  . Alcohol use: No  . Drug use: No  . Sexual activity: Not Currently

## 2020-05-11 ENCOUNTER — Encounter: Payer: Self-pay | Admitting: Orthopedic Surgery

## 2020-05-11 ENCOUNTER — Other Ambulatory Visit: Payer: Self-pay

## 2020-05-11 ENCOUNTER — Ambulatory Visit: Payer: BLUE CROSS/BLUE SHIELD | Admitting: Orthopedic Surgery

## 2020-05-11 ENCOUNTER — Ambulatory Visit: Payer: Self-pay

## 2020-05-11 VITALS — Ht 65.0 in | Wt 145.0 lb

## 2020-05-11 DIAGNOSIS — Z96652 Presence of left artificial knee joint: Secondary | ICD-10-CM | POA: Diagnosis not present

## 2020-05-11 DIAGNOSIS — G8929 Other chronic pain: Secondary | ICD-10-CM

## 2020-05-11 DIAGNOSIS — M25562 Pain in left knee: Secondary | ICD-10-CM | POA: Diagnosis not present

## 2020-05-11 NOTE — Progress Notes (Signed)
Office Visit Note   Patient: [name] [name]           Date of Birth: 1966-09-19           MRN: [id number] Visit Date: 05/11/2020              Requested by: Frazier Butt., MD [address] [place],  [place] [address] PCP: Frazier Butt., MD   Assessment & Plan: Visit Diagnoses:  1. Chronic pain of left knee   2. S/P total knee replacement using cement, left     Plan:  #1: At this time monitor to try Voltaren gel as an anti-inflammatory. #2: If she continues to have symptoms then may need to consider work-up for loosening  Follow-Up Instructions: Return in about 1 week (around 05/18/2020) for follow up.   Face-to-face time spent with patient was greater than 40 minutes.  Greater than 50% of the time was spent in counseling and coordination of care.  Orders:  Orders Placed This Encounter  Procedures  . XR KNEE 3 VIEW LEFT   No orders of the defined types were placed in this encounter.     Procedures: No procedures performed   Clinical Data: No additional findings.   Subjective: Chief Complaint  Patient presents with  . Left Knee - Pain   HPI Patient presents today for left knee pain. Two weeks ago she had sudden intense pain in her left knee. No known injury. She said that last summer she fell and injured her right foot. She saw Dr.Lennon for her foot, but also had her left knee checked while there because she wanted a cortisone injection. He x-rayed her knee and was told that a screw was out on the medial side of her knee. She said that her knee hurts to the point that she cannot extend her leg. She states that her knee is painful and swollen medially.    Review of Systems  Constitutional: Positive for fatigue.  HENT: Negative for ear pain.   Eyes: Negative for pain.  Respiratory: Negative for shortness of breath.   Cardiovascular: Negative for leg swelling.  Gastrointestinal: Negative for constipation and diarrhea.  Endocrine: Negative for cold  intolerance and heat intolerance.  Genitourinary: Negative for difficulty urinating.  Musculoskeletal: Positive for joint swelling.  Skin: Negative for rash.  Allergic/Immunologic: Negative for food allergies.  Neurological: Negative for weakness.  Hematological: Does not bruise/bleed easily.  Psychiatric/Behavioral: Positive for sleep disturbance.     Objective: Vital Signs: Ht 5\' 5"  (1.651 m)   Wt 145 lb (65.8 kg)   LMP 01/23/2015 Comment: perimenopausal (very irregular), no chance pregnant  BMI 24.13 kg/m   Physical Exam Constitutional:      Appearance: Normal appearance. She is well-developed and normal weight.  HENT:     Head: Normocephalic.  Eyes:     Pupils: Pupils are equal, round, and reactive to light.  Pulmonary:     Effort: Pulmonary effort is normal.  Skin:    General: Skin is warm and dry.  Neurological:     Mental Status: She is alert and oriented to person, place, and time.  Psychiatric:        Behavior: Behavior normal.     Ortho Exam  Exam today reveals a trace effusion.  She does have some tenderness to palpation over the proximal medial tibia.  No crepitance with range of motion.  Good patellofemoral motion.  Ligamentously stable.  She does have a small Baker's cyst posteriorly.  Specialty Comments:  No specialty comments available.  Imaging: XR KNEE 3 VIEW LEFT  Result Date: 05/11/2020 Three-view x-ray of the left knee reveals good patellofemoral position.  Does have a screw along the medial tibial plateau which appears to be intact.  Small area of lucency at the midportion of the screw.  Some calcification laterally and posteriorly consistent with a bone cement.No apparent loosening of the prosthesis.    PMFS History: Patient Active Problem List   Diagnosis Date Noted  . Right foot pain 02/25/2019  . Unilateral primary osteoarthritis, left knee 01/21/2017  . S/P total knee replacement using cement, left 01/21/2017  . Chondromalacia  patellae, left knee 01/02/2017  . Torn meniscus 06/29/2015  . Osteochondral defect of femoral condyle 06/29/2015  . Plantar fasciitis of right foot 09/27/2014   Past Medical History:  Diagnosis Date  . Anxiety   . Arthritis   . Bell's palsy 2016   left side   . Cancer ([facility])    skin cancer- left temporal " sitting on bone"  . Depression   . Hypercholesteremia   . PONV (postoperative nausea and vomiting)    would like scope patch    History reviewed. No pertinent family history.  Past Surgical History:  Procedure Laterality Date  . AUGMENTATION MAMMAPLASTY Bilateral    placed 14 years ago   . CHONDROPLASTY Left 06/29/2015   Procedure: CHONDROPLASTY;  Surgeon: Garald Balding, MD;  Location: [facility];  Service: Orthopedics;  Laterality: Left;  . FOOT SURGERY Bilateral    Bone Spurs  . KNEE ARTHROSCOPY WITH DRILLING/MICROFRACTURE Left 06/29/2015   Procedure: KNEE ARTHROSCOPY WITH DRILLING/MICROFRACTURE;  Surgeon: Garald Balding, MD;  Location: [facility];  Service: Orthopedics;  Laterality: Left;  . KNEE ARTHROSCOPY WITH LATERAL MENISECTOMY Left 06/29/2015   Procedure: KNEE ARTHROSCOPY WITH LATERAL MENISECTOMY;  Surgeon: Garald Balding, MD;  Location: [facility];  Service: Orthopedics;  Laterality: Left;  . KNEE ARTHROSCOPY WITH LATERAL RELEASE Left 06/29/2015   Procedure: KNEE ARTHROSCOPY WITH LATERAL RELEASE;  Surgeon: Garald Balding, MD;  Location: [facility];  Service: Orthopedics;  Laterality: Left;  . KNEE SURGERY    . Mohls Left    face  . PLANTAR FASCIA RELEASE Right 09/27/2014   Procedure: PLANTAR FASCIA RELEASE;  Surgeon: Garald Balding, MD;  Location: [facility];  Service: Orthopedics;  Laterality: Right;  . TONSILLECTOMY    . TOTAL KNEE ARTHROPLASTY Left 01/21/2017   Procedure: TOTAL KNEE ARTHROPLASTY;  Surgeon: Garald Balding, MD;  Location: [facility];  Service: Orthopedics;  Laterality:  Left;  . Tummy tuck     2010   Social History   Occupational History  . Not on file  Tobacco Use  . Smoking status: Never Smoker  . Smokeless tobacco: Never Used  Substance and Sexual Activity  . Alcohol use: No  . Drug use: No  . Sexual activity: Not Currently

## 2020-05-18 ENCOUNTER — Ambulatory Visit (INDEPENDENT_AMBULATORY_CARE_PROVIDER_SITE_OTHER): Payer: BLUE CROSS/BLUE SHIELD | Admitting: Orthopaedic Surgery

## 2020-05-18 ENCOUNTER — Encounter: Payer: Self-pay | Admitting: Orthopaedic Surgery

## 2020-05-18 ENCOUNTER — Other Ambulatory Visit: Payer: Self-pay

## 2020-05-18 DIAGNOSIS — G8929 Other chronic pain: Secondary | ICD-10-CM

## 2020-05-18 DIAGNOSIS — M25562 Pain in left knee: Secondary | ICD-10-CM | POA: Insufficient documentation

## 2020-05-18 MED ORDER — BUPIVACAINE HCL 0.5 % IJ SOLN
2.0000 mL | INTRAMUSCULAR | Status: AC | PRN
  Administered 2020-05-18: 2 mL via INTRA_ARTICULAR

## 2020-05-18 MED ORDER — LIDOCAINE HCL 1 % IJ SOLN
2.0000 mL | INTRAMUSCULAR | Status: AC | PRN
  Administered 2020-05-18: 2 mL

## 2020-05-18 MED ORDER — METHYLPREDNISOLONE ACETATE 40 MG/ML IJ SUSP
80.0000 mg | INTRAMUSCULAR | Status: AC | PRN
  Administered 2020-05-18: 80 mg via INTRA_ARTICULAR

## 2020-05-18 NOTE — Progress Notes (Signed)
Office Visit Note   Patient: [name] [name]           Date of Birth: July 30, 1966           MRN: [id number] Visit Date: 05/18/2020              Requested by: Frazier Butt., MD [address] [place],  [place] [address] PCP: Frazier Butt., MD   Assessment & Plan: Visit Diagnoses:  1. Chronic pain of left knee     Plan: Kaidan has had some recurrent pain along the lateral aspect of her left total knee replacement.  In the past I have injected these areas with relief.  They appear to be extra-articular.  She also has developed some tenderness over the head of the screw that was inserted at the time of her knee replacement surgery several years ago with a bursa formation I am going to inject that today.  I did review the films that were performed last week and did not see any abnormality complication relative to the knee replacement components  Follow-Up Instructions: Return if symptoms worsen or fail to improve.   Orders:  Orders Placed This Encounter  Procedures  . Large Joint Inj: L knee   No orders of the defined types were placed in this encounter.     Procedures: Large Joint Inj: L knee on 05/18/2020 4:24 PM Indications: pain and diagnostic evaluation Details: 25 G 1.5 in needle, anteromedial approach  Arthrogram: No  Medications: 2 mL lidocaine 1 %; 2 mL bupivacaine 0.5 %; 80 mg methylPREDNISolone acetate 40 MG/ML  Injected 2 areas of trigger point tenderness along the medial and lateral tibial plateau Procedure, treatment alternatives, risks and benefits explained, specific risks discussed. Consent was given by the patient. Patient was prepped and draped in the usual sterile fashion.       Clinical Data: No additional findings.   Subjective: Chief Complaint  Patient presents with  . Left Knee - Pain  Patient presents today for follow up on her left knee. She saw Aaron Edelman last week and is back this week to discuss her problems with Dr.Whitfield. Three weeks  ago she developed sudden intense pain. No injury. She states that she cannot extend her leg fully due to pain. She had x-rays with Aaron Edelman last week, after being told by Dr.Lennon last summer that she had a floating screw in her knee. She also wants to discuss with Dr.Whitfield about her right foot. She said that she injured it last summer and saw Dr.Lennon.  HPI  Review of Systems   Objective: Vital Signs: LMP 01/23/2015 Comment: perimenopausal (very irregular), no chance pregnant  Physical Exam Constitutional:      Appearance: She is well-developed.  Eyes:     Pupils: Pupils are equal, round, and reactive to light.  Pulmonary:     Effort: Pulmonary effort is normal.  Skin:    General: Skin is warm and dry.  Neurological:     Mental Status: She is alert and oriented to person, place, and time.  Psychiatric:        Behavior: Behavior normal.     Ortho Exam left knee without effusion.  Full quick extension and flexion over 105 degrees.  No instability.  Skin intact.  Does have an area of tenderness appears to overlie the proximal medial tibial plateau where the screw was inserted at the time of her knee replacement surgery several years ago.  No induration or erythema.  Also has  another area of tenderness in the lateral tibial plateau anteriorly that appears to be below the joint line.  No skin changes  Specialty Comments:  No specialty comments available.  Imaging: No results found.   PMFS History: Patient Active Problem List   Diagnosis Date Noted  . Pain in left knee 05/18/2020  . Right foot pain 02/25/2019  . Unilateral primary osteoarthritis, left knee 01/21/2017  . S/P total knee replacement using cement, left 01/21/2017  . Chondromalacia patellae, left knee 01/02/2017  . Torn meniscus 06/29/2015  . Osteochondral defect of femoral condyle 06/29/2015  . Plantar fasciitis of right foot 09/27/2014   Past Medical History:  Diagnosis Date  . Anxiety   . Arthritis   .  Bell's palsy 2016   left side   . 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 Depression   . Hypercholesteremia   . PONV (postoperative nausea and vomiting)    would like scope patch    History reviewed. No pertinent family history.  Past Surgical History:  Procedure Laterality Date  . AUGMENTATION MAMMAPLASTY Bilateral    placed 14 years ago   . CHONDROPLASTY Left 06/29/2015   Procedure: CHONDROPLASTY;  Surgeon: Garald Balding, MD;  Location: [facility];  Service: Orthopedics;  Laterality: Left;  . FOOT SURGERY Bilateral    Bone Spurs  . KNEE ARTHROSCOPY WITH DRILLING/MICROFRACTURE Left 06/29/2015   Procedure: KNEE ARTHROSCOPY WITH DRILLING/MICROFRACTURE;  Surgeon: Garald Balding, MD;  Location: [facility];  Service: Orthopedics;  Laterality: Left;  . KNEE ARTHROSCOPY WITH LATERAL MENISECTOMY Left 06/29/2015   Procedure: KNEE ARTHROSCOPY WITH LATERAL MENISECTOMY;  Surgeon: Garald Balding, MD;  Location: [facility];  Service: Orthopedics;  Laterality: Left;  . KNEE ARTHROSCOPY WITH LATERAL RELEASE Left 06/29/2015   Procedure: KNEE ARTHROSCOPY WITH LATERAL RELEASE;  Surgeon: Garald Balding, MD;  Location: [facility];  Service: Orthopedics;  Laterality: Left;  . KNEE SURGERY    . Mohls Left    face  . PLANTAR FASCIA RELEASE Right 09/27/2014   Procedure: PLANTAR FASCIA RELEASE;  Surgeon: Garald Balding, MD;  Location: [facility];  Service: Orthopedics;  Laterality: Right;  . TONSILLECTOMY    . TOTAL KNEE ARTHROPLASTY Left 01/21/2017   Procedure: TOTAL KNEE ARTHROPLASTY;  Surgeon: Garald Balding, MD;  Location: [facility];  Service: Orthopedics;  Laterality: Left;  . Tummy tuck     2010   Social History   Occupational History  . Not on file  Tobacco Use  . Smoking status: Never Smoker  . Smokeless tobacco: Never Used  Substance and Sexual Activity  . Alcohol use: No  . Drug use:  No  . Sexual activity: Not Currently

## 2020-11-09 ENCOUNTER — Ambulatory Visit: Payer: BLUE CROSS/BLUE SHIELD | Admitting: Orthopaedic Surgery

## 2020-11-09 ENCOUNTER — Encounter: Payer: Self-pay | Admitting: Orthopaedic Surgery

## 2020-11-09 ENCOUNTER — Other Ambulatory Visit: Payer: Self-pay

## 2020-11-09 VITALS — Ht 65.0 in | Wt 145.0 lb

## 2020-11-09 DIAGNOSIS — G8929 Other chronic pain: Secondary | ICD-10-CM

## 2020-11-09 DIAGNOSIS — M25562 Pain in left knee: Secondary | ICD-10-CM

## 2020-11-09 DIAGNOSIS — M1712 Unilateral primary osteoarthritis, left knee: Secondary | ICD-10-CM

## 2020-11-09 DIAGNOSIS — Z96652 Presence of left artificial knee joint: Secondary | ICD-10-CM | POA: Diagnosis not present

## 2020-11-09 MED ORDER — METHYLPREDNISOLONE ACETATE 40 MG/ML IJ SUSP
40.0000 mg | INTRAMUSCULAR | Status: AC | PRN
  Administered 2020-11-09: 40 mg via INTRAMUSCULAR

## 2020-11-09 MED ORDER — LIDOCAINE HCL 1 % IJ SOLN
2.0000 mL | INTRAMUSCULAR | Status: AC | PRN
  Administered 2020-11-09: 2 mL

## 2020-11-09 NOTE — Progress Notes (Signed)
Office Visit Note   Patient: [name] [name]           Date of Birth: [birth year]           MRN: [id number] Visit Date: 11/09/2020              Requested by: Frazier Butt., MD [address] [place],  [place] [address] PCP: Frazier Butt., MD   Assessment & Plan: Visit Diagnoses:  1. Unilateral primary osteoarthritis, left knee   2. S/P total knee replacement using cement, left   3. Chronic pain of left knee     Plan: Several years status post primary left total knee replacement.  There has been some recurrent pain along the lateral proximal tibia and the medial proximal tibia.  Medially she had a screw inserted and I suspect she has developed a little bursa over the tip of the screw so I have injected this with cortisone.  She has an area of tenderness along the proximal tibia laterally and it could just be an area of scar or bursa formation but there is no change in appearance on her x-rays in the past or by exam so at this was injected as well.  We will plan to see her back as needed  Follow-Up Instructions: Return if symptoms worsen or fail to improve.   Orders:  Orders Placed This Encounter  Procedures  . Trigger Point Inj   No orders of the defined types were placed in this encounter.     Procedures: Trigger Point Inj  Date/Time: 11/09/2020 3:35 PM Performed by: Garald Balding, MD Authorized by: Garald Balding, MD   Consent Given by:  Patient Indications:  Pain Total # of Trigger Points:  2 Location: lower extremity   Needle Size:  25 G Approach:  Lateral and medial Medications #1:  2 mL lidocaine 1 % Medications #2:  40 mg methylPREDNISolone acetate 40 MG/ML Comments: 20 mg of Depo-Medrol was injected into the trigger point area of tenderness along the medial and lateral aspect of her left total knee replacement     Clinical Data: No additional findings.   Subjective: Chief Complaint  Patient presents with  . Left Knee - Follow-up, Pain    Patient presents today for recurrent chronic left knee pain. She was here last in May of 2021 and received a cortisone injection. She is here today to get another cortisone injection.  HPI  Review of Systems   Objective: Vital Signs: Ht 5\' 5"  (1.651 m)   Wt 145 lb (65.8 kg)   LMP 01/23/2015 Comment: perimenopausal (very irregular), no chance pregnant  BMI 24.13 kg/m   Physical Exam Constitutional:      Appearance: She is well-developed.  Eyes:     Pupils: Pupils are equal, round, and reactive to light.  Pulmonary:     Effort: Pulmonary effort is normal.  Skin:    General: Skin is warm and dry.  Neurological:     Mental Status: She is alert and oriented to person, place, and time.  Psychiatric:        Behavior: Behavior normal.     Ortho Exam awake alert and oriented x3 comfortable sitting.  No ambulatory aid.  Has 2 areas of tenderness in her left total knee replacement.  One is just below the joint line laterally and anteriorly the second is medially.  No skin changes ecchymosis erythema or induration.  Full extension of her knee without instability and flexed over  100degrees.  No grinding or crepitation or discomfort.  No patella pain Specialty Comments:  No specialty comments available.  Imaging: No results found.   PMFS History: Patient Active Problem List   Diagnosis Date Noted  . Pain in left knee 05/18/2020  . Right foot pain 02/25/2019  . Unilateral primary osteoarthritis, left knee 01/21/2017  . S/P total knee replacement using cement, left 01/21/2017  . Chondromalacia patellae, left knee 01/02/2017  . Torn meniscus 06/29/2015  . Osteochondral defect of femoral condyle 06/29/2015  . Plantar fasciitis of right foot 09/27/2014   Past Medical History:  Diagnosis Date  . Anxiety   . Arthritis   . Bell's palsy 2016   left side   . 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 Depression   . Hypercholesteremia   . PONV (postoperative  nausea and vomiting)    would like scope patch    History reviewed. No pertinent family history.  Past Surgical History:  Procedure Laterality Date  . AUGMENTATION MAMMAPLASTY Bilateral    placed 14 years ago   . CHONDROPLASTY Left 06/29/2015   Procedure: CHONDROPLASTY;  Surgeon: Garald Balding, MD;  Location: [facility];  Service: Orthopedics;  Laterality: Left;  . FOOT SURGERY Bilateral    Bone Spurs  . KNEE ARTHROSCOPY WITH DRILLING/MICROFRACTURE Left 06/29/2015   Procedure: KNEE ARTHROSCOPY WITH DRILLING/MICROFRACTURE;  Surgeon: Garald Balding, MD;  Location: [facility];  Service: Orthopedics;  Laterality: Left;  . KNEE ARTHROSCOPY WITH LATERAL MENISECTOMY Left 06/29/2015   Procedure: KNEE ARTHROSCOPY WITH LATERAL MENISECTOMY;  Surgeon: Garald Balding, MD;  Location: [facility];  Service: Orthopedics;  Laterality: Left;  . KNEE ARTHROSCOPY WITH LATERAL RELEASE Left 06/29/2015   Procedure: KNEE ARTHROSCOPY WITH LATERAL RELEASE;  Surgeon: Garald Balding, MD;  Location: [facility];  Service: Orthopedics;  Laterality: Left;  . KNEE SURGERY    . Mohls Left    face  . PLANTAR FASCIA RELEASE Right 09/27/2014   Procedure: PLANTAR FASCIA RELEASE;  Surgeon: Garald Balding, MD;  Location: [facility];  Service: Orthopedics;  Laterality: Right;  . TONSILLECTOMY    . TOTAL KNEE ARTHROPLASTY Left 01/21/2017   Procedure: TOTAL KNEE ARTHROPLASTY;  Surgeon: Garald Balding, MD;  Location: [facility];  Service: Orthopedics;  Laterality: Left;  . Tummy tuck     2010   Social History   Occupational History  . Not on file  Tobacco Use  . Smoking status: Never Smoker  . Smokeless tobacco: Never Used  Vaping Use  . Vaping Use: Never used  Substance and Sexual Activity  . Alcohol use: No  . Drug use: No  . Sexual activity: Not Currently

## 2021-02-27 ENCOUNTER — Ambulatory Visit: Payer: BLUE CROSS/BLUE SHIELD | Admitting: Orthopaedic Surgery

## 2021-02-28 ENCOUNTER — Telehealth: Payer: Self-pay | Admitting: Orthopaedic Surgery

## 2021-02-28 NOTE — Telephone Encounter (Signed)
Called and spoke with patient. She missed her appointment yesterday and has already rescheduled for next week.

## 2021-02-28 NOTE — Telephone Encounter (Signed)
Pt called stating she needs a CB from [place] and just kept stating it's in regards to Dr. Durward Fortes

## 2021-03-07 ENCOUNTER — Ambulatory Visit: Payer: BLUE CROSS/BLUE SHIELD | Admitting: Orthopaedic Surgery

## 2021-03-08 ENCOUNTER — Other Ambulatory Visit: Payer: Self-pay

## 2021-03-08 ENCOUNTER — Ambulatory Visit: Payer: BLUE CROSS/BLUE SHIELD | Admitting: Orthopaedic Surgery

## 2021-03-08 ENCOUNTER — Encounter: Payer: Self-pay | Admitting: Orthopaedic Surgery

## 2021-03-08 VITALS — Ht 65.0 in | Wt 145.0 lb

## 2021-03-08 DIAGNOSIS — Z96652 Presence of left artificial knee joint: Secondary | ICD-10-CM

## 2021-03-08 DIAGNOSIS — M1712 Unilateral primary osteoarthritis, left knee: Secondary | ICD-10-CM | POA: Diagnosis not present

## 2021-03-08 MED ORDER — BUPIVACAINE HCL 0.25 % IJ SOLN
2.0000 mL | INTRAMUSCULAR | Status: AC | PRN
  Administered 2021-03-08: 2 mL via INTRA_ARTICULAR

## 2021-03-08 MED ORDER — LIDOCAINE HCL 1 % IJ SOLN
2.0000 mL | INTRAMUSCULAR | Status: AC | PRN
  Administered 2021-03-08: 2 mL

## 2021-03-08 NOTE — Progress Notes (Signed)
Office Visit Note   Patient: [name] [name]           Date of Birth: Aug 10, 1966           MRN: [id number] Visit Date: 03/08/2021              Requested by: Frazier Butt., MD [address] [place],  [place] [address] PCP: Frazier Butt., MD   Assessment & Plan: Visit Diagnoses:  1. Unilateral primary osteoarthritis, left knee   2. S/P total knee replacement using cement, left     Plan: Verdene is having some recurrent pain along the lateral aspect of her total knee replacement and also over a bursa overlying the prior screw insertion.  No other symptoms.  I am going to inject both areas today as I have in the past with good relief.  Prior films have demonstrated excellent position of the components without any problems  Follow-Up Instructions: Return if symptoms worsen or fail to improve.   Orders:  Orders Placed This Encounter  Procedures  . Large Joint Inj: L knee   No orders of the defined types were placed in this encounter.     Procedures: Large Joint Inj: L knee on 03/08/2021 2:44 PM Indications: pain and diagnostic evaluation Details: 25 G 1.5 in needle, anteromedial approach  Arthrogram: No  Medications: 2 mL lidocaine 1 %; 2 mL bupivacaine 0.25 %  12 mg betamethasone injected with Xylocaine and Marcaine into the area of tenderness along the lateral proximal tibia near the joint line.  Same area as previously injected in the past.  Also injected the bursa over the screw head along the medial tibia Procedure, treatment alternatives, risks and benefits explained, specific risks discussed. Consent was given by the patient. Patient was prepped and draped in the usual sterile fashion.       Clinical Data: No additional findings.   Subjective: Chief Complaint  Patient presents with  . Left Knee - Pain  Patient presents today for chronic left knee pain. She states that she is wanting another injection.  The injections "work" and she really does not have much  trouble in terms of swelling or redness or use of ambulatory aid.  Is not taking any pain medicine.  Prior films have demonstrated no abnormality with the components.  Her pain is localized along the anterior lateral joint line and over the previously inserted screw for a small fracture of the medial tibia  HPI  Review of Systems   Objective: Vital Signs: Ht 5\' 5"  (1.651 m)   Wt 145 lb (65.8 kg)   LMP 01/23/2015 Comment: perimenopausal (very irregular), no chance pregnant  BMI 24.13 kg/m   Physical Exam Constitutional:      Appearance: She is well-developed.  Eyes:     Pupils: Pupils are equal, round, and reactive to light.  Pulmonary:     Effort: Pulmonary effort is normal.  Skin:    General: Skin is warm and dry.  Neurological:     Mental Status: She is alert and oriented to person, place, and time.  Psychiatric:        Behavior: Behavior normal.     Ortho Exam awake alert and oriented x3.  Comfortable sitting.  Has had recent facial surgery.  Left knee was not hot red warm or swollen.  No effusion or instability.  There is a localized area of tenderness at the anterior lateral joint line similar to what she has had in the past.  No crepitation.  No skin change.  Also an area of tenderness of the medial tibial near the joint line where there was a screw inserted.  No distal edema.  Neurologically intact Specialty Comments:  No specialty comments available.  Imaging: No results found.   PMFS History: Patient Active Problem List   Diagnosis Date Noted  . Pain in left knee 05/18/2020  . Right foot pain 02/25/2019  . Unilateral primary osteoarthritis, left knee 01/21/2017  . S/P total knee replacement using cement, left 01/21/2017  . Chondromalacia patellae, left knee 01/02/2017  . Torn meniscus 06/29/2015  . Osteochondral defect of femoral condyle 06/29/2015  . Plantar fasciitis of right foot 09/27/2014   Past Medical History:  Diagnosis Date  . Anxiety   .  Arthritis   . Bell's palsy 2016   left side   . 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 Depression   . Hypercholesteremia   . PONV (postoperative nausea and vomiting)    would like scope patch    History reviewed. No pertinent family history.  Past Surgical History:  Procedure Laterality Date  . AUGMENTATION MAMMAPLASTY Bilateral    placed 14 years ago   . CHONDROPLASTY Left 06/29/2015   Procedure: CHONDROPLASTY;  Surgeon: Garald Balding, MD;  Location: [facility];  Service: Orthopedics;  Laterality: Left;  . FOOT SURGERY Bilateral    Bone Spurs  . KNEE ARTHROSCOPY WITH DRILLING/MICROFRACTURE Left 06/29/2015   Procedure: KNEE ARTHROSCOPY WITH DRILLING/MICROFRACTURE;  Surgeon: Garald Balding, MD;  Location: [facility];  Service: Orthopedics;  Laterality: Left;  . KNEE ARTHROSCOPY WITH LATERAL MENISECTOMY Left 06/29/2015   Procedure: KNEE ARTHROSCOPY WITH LATERAL MENISECTOMY;  Surgeon: Garald Balding, MD;  Location: [facility];  Service: Orthopedics;  Laterality: Left;  . KNEE ARTHROSCOPY WITH LATERAL RELEASE Left 06/29/2015   Procedure: KNEE ARTHROSCOPY WITH LATERAL RELEASE;  Surgeon: Garald Balding, MD;  Location: [facility];  Service: Orthopedics;  Laterality: Left;  . KNEE SURGERY    . Mohls Left    face  . PLANTAR FASCIA RELEASE Right 09/27/2014   Procedure: PLANTAR FASCIA RELEASE;  Surgeon: Garald Balding, MD;  Location: [facility];  Service: Orthopedics;  Laterality: Right;  . TONSILLECTOMY    . TOTAL KNEE ARTHROPLASTY Left 01/21/2017   Procedure: TOTAL KNEE ARTHROPLASTY;  Surgeon: Garald Balding, MD;  Location: [facility];  Service: Orthopedics;  Laterality: Left;  . Tummy tuck     2010   Social History   Occupational History  . Not on file  Tobacco Use  . Smoking status: Never Smoker  . Smokeless tobacco: Never Used  Vaping Use  . Vaping Use: Never used   Substance and Sexual Activity  . Alcohol use: No  . Drug use: No  . Sexual activity: Not Currently

## 2021-03-13 ENCOUNTER — Ambulatory Visit: Payer: BLUE CROSS/BLUE SHIELD | Admitting: Orthopaedic Surgery

## 2021-05-07 ENCOUNTER — Telehealth: Payer: Self-pay

## 2021-05-07 NOTE — Telephone Encounter (Signed)
Patient called she is requesting a call back regarding her knee call back: [phone]

## 2021-05-07 NOTE — Telephone Encounter (Signed)
Patient has questions about if she can go ahead and get another knee injection. She had it injected 03/08/2021. Are you okay with this? She said that it is now hurting in a different area.

## 2021-05-07 NOTE — Telephone Encounter (Signed)
Ok to inject in a different area

## 2021-05-07 NOTE — Telephone Encounter (Signed)
Called and scheduled patient

## 2021-05-08 ENCOUNTER — Other Ambulatory Visit: Payer: Self-pay

## 2021-05-08 ENCOUNTER — Encounter: Payer: Self-pay | Admitting: Orthopaedic Surgery

## 2021-05-08 ENCOUNTER — Ambulatory Visit: Payer: BLUE CROSS/BLUE SHIELD | Admitting: Orthopaedic Surgery

## 2021-05-08 DIAGNOSIS — M25562 Pain in left knee: Secondary | ICD-10-CM

## 2021-05-08 DIAGNOSIS — G8929 Other chronic pain: Secondary | ICD-10-CM | POA: Diagnosis not present

## 2021-05-08 MED ORDER — LIDOCAINE HCL 1 % IJ SOLN
2.0000 mL | INTRAMUSCULAR | Status: AC | PRN
  Administered 2021-05-08: 2 mL

## 2021-05-08 MED ORDER — BUPIVACAINE HCL 0.25 % IJ SOLN
2.0000 mL | INTRAMUSCULAR | Status: AC | PRN
  Administered 2021-05-08: 2 mL via INTRA_ARTICULAR

## 2021-05-08 NOTE — Progress Notes (Signed)
Office Visit Note   Patient: [name] [name]           Date of Birth: 1966-09-17           MRN: [id number] Visit Date: 05/08/2021              Requested by: Frazier Butt., MD [address] [place],  [place] [address] PCP: Frazier Butt., MD   Assessment & Plan: Visit Diagnoses:  1. Chronic pain of left knee     Plan: Recurrent areas of tenderness about the left total knee replacement.  These are similar to the prior areas of tenderness that represent medially a bursa overlying a screw and some mild localized inflammatory tissue laterally.  These areas have been injected in the past with good results.  Prior films were negative for any obvious abnormality about the knee replacement.  Will reinject these areas of tenderness today  Follow-Up Instructions: Return if symptoms worsen or fail to improve.   Orders:  Orders Placed This Encounter  Procedures  . Large Joint Inj: L knee   No orders of the defined types were placed in this encounter.     Procedures: Large Joint Inj: L knee on 05/08/2021 5:12 PM Indications: pain and diagnostic evaluation Details: 25 G 1.5 in needle, anteromedial approach  Arthrogram: No  Medications: 2 mL lidocaine 1 %; 2 mL bupivacaine 0.25 %  12 mg betamethasone injected into doses over areas of point tenderness along the lateral and medial aspect of left knee Procedure, treatment alternatives, risks and benefits explained, specific risks discussed. Consent was given by the patient. Patient was prepped and draped in the usual sterile fashion.       Clinical Data: No additional findings.   Subjective: Chief Complaint  Patient presents with  . Left Knee - Pain  Patient presents today for recurrent left knee pain. She received an injection two months ago. She said that it started to hurt one week ago. She is wanting to get another injection today. She is scheduled for right foot surgery with Dr.Lennon this week.   HPI  Review of  Systems   Objective: Vital Signs: LMP 01/23/2015 Comment: perimenopausal (very irregular), no chance pregnant  Physical Exam Constitutional:      Appearance: She is well-developed.  Eyes:     Pupils: Pupils are equal, round, and reactive to light.  Pulmonary:     Effort: Pulmonary effort is normal.  Skin:    General: Skin is warm and dry.  Neurological:     Mental Status: She is alert and oriented to person, place, and time.  Psychiatric:        Behavior: Behavior normal.     Ortho Exam awake alert and oriented x3.  Comfortable sitting.  Inessa has circled 2 areas of tenderness about her left total knee replacement that have been previously injected.  1 is located medially over an area of bursa formation from screw that was inserted at the time of surgery.  The other is over the lateral proximal tibia.  There is no effusion of her knee and there is no instability and she is got excellent range of motion  Specialty Comments:  No specialty comments available.  Imaging: No results found.   PMFS History: Patient Active Problem List   Diagnosis Date Noted  . Pain in left knee 05/18/2020  . Right foot pain 02/25/2019  . Unilateral primary osteoarthritis, left knee 01/21/2017  . S/P total knee replacement using cement,  left 01/21/2017  . Chondromalacia patellae, left knee 01/02/2017  . Torn meniscus 06/29/2015  . Osteochondral defect of femoral condyle 06/29/2015  . Plantar fasciitis of right foot 09/27/2014   Past Medical History:  Diagnosis Date  . Anxiety   . Arthritis   . Bell's palsy 2016   left side   . Cancer ([facility])    skin cancer- left temporal " sitting on bone"  . Depression   . Hypercholesteremia   . PONV (postoperative nausea and vomiting)    would like scope patch    History reviewed. No pertinent family history.  Past Surgical History:  Procedure Laterality Date  . AUGMENTATION MAMMAPLASTY Bilateral    placed 14 years ago   . CHONDROPLASTY Left  06/29/2015   Procedure: CHONDROPLASTY;  Surgeon: Garald Balding, MD;  Location: [facility];  Service: Orthopedics;  Laterality: Left;  . FOOT SURGERY Bilateral    Bone Spurs  . KNEE ARTHROSCOPY WITH DRILLING/MICROFRACTURE Left 06/29/2015   Procedure: KNEE ARTHROSCOPY WITH DRILLING/MICROFRACTURE;  Surgeon: Garald Balding, MD;  Location: [facility];  Service: Orthopedics;  Laterality: Left;  . KNEE ARTHROSCOPY WITH LATERAL MENISECTOMY Left 06/29/2015   Procedure: KNEE ARTHROSCOPY WITH LATERAL MENISECTOMY;  Surgeon: Garald Balding, MD;  Location: [facility];  Service: Orthopedics;  Laterality: Left;  . KNEE ARTHROSCOPY WITH LATERAL RELEASE Left 06/29/2015   Procedure: KNEE ARTHROSCOPY WITH LATERAL RELEASE;  Surgeon: Garald Balding, MD;  Location: [facility];  Service: Orthopedics;  Laterality: Left;  . KNEE SURGERY    . Mohls Left    face  . PLANTAR FASCIA RELEASE Right 09/27/2014   Procedure: PLANTAR FASCIA RELEASE;  Surgeon: Garald Balding, MD;  Location: [facility];  Service: Orthopedics;  Laterality: Right;  . TONSILLECTOMY    . TOTAL KNEE ARTHROPLASTY Left 01/21/2017   Procedure: TOTAL KNEE ARTHROPLASTY;  Surgeon: Garald Balding, MD;  Location: [facility];  Service: Orthopedics;  Laterality: Left;  . Tummy tuck     2010   Social History   Occupational History  . Not on file  Tobacco Use  . Smoking status: Never Smoker  . Smokeless tobacco: Never Used  Vaping Use  . Vaping Use: Never used  Substance and Sexual Activity  . Alcohol use: No  . Drug use: No  . Sexual activity: Not Currently

## 2021-09-19 ENCOUNTER — Telehealth: Payer: Self-pay | Admitting: Orthopaedic Surgery

## 2021-09-19 NOTE — Telephone Encounter (Signed)
Called-please set up an appt for next week

## 2021-09-19 NOTE — Telephone Encounter (Signed)
Called and spoke with patient. Scheduled for next week.

## 2021-09-19 NOTE — Telephone Encounter (Signed)
Pt called asking for a CB in regards to something she broke.   [phone]

## 2021-09-25 ENCOUNTER — Other Ambulatory Visit: Payer: Self-pay

## 2021-09-25 ENCOUNTER — Encounter: Payer: Self-pay | Admitting: Orthopaedic Surgery

## 2021-09-25 ENCOUNTER — Ambulatory Visit: Payer: Self-pay

## 2021-09-25 ENCOUNTER — Ambulatory Visit: Payer: BLUE CROSS/BLUE SHIELD | Admitting: Orthopaedic Surgery

## 2021-09-25 DIAGNOSIS — G8929 Other chronic pain: Secondary | ICD-10-CM

## 2021-09-25 DIAGNOSIS — M25562 Pain in left knee: Secondary | ICD-10-CM | POA: Diagnosis not present

## 2021-09-25 MED ORDER — LIDOCAINE HCL 1 % IJ SOLN
2.0000 mL | INTRAMUSCULAR | Status: AC | PRN
  Administered 2021-09-25: 2 mL

## 2021-09-25 MED ORDER — METHYLPREDNISOLONE ACETATE 40 MG/ML IJ SUSP
20.0000 mg | INTRAMUSCULAR | Status: AC | PRN
  Administered 2021-09-25: 20 mg via INTRA_ARTICULAR

## 2021-09-25 MED ORDER — BUPIVACAINE HCL 0.25 % IJ SOLN
2.0000 mL | INTRAMUSCULAR | Status: AC | PRN
  Administered 2021-09-25: 2 mL via INTRA_ARTICULAR

## 2021-09-25 NOTE — Progress Notes (Signed)
Office Visit Note   Patient: [name] [name]           Date of Birth: 07-04-1966           MRN: [id number] Visit Date: 09/25/2021              Requested by: Frazier Butt., MD [address] [place],  [place] [address] PCP: Frazier Butt., MD   Assessment & Plan: Visit Diagnoses:  1. Chronic pain of left knee     Plan: Lateka returns for evaluation of pain in the same 2 locations about the left total knee replacement.  1 area is the posterior medial tibial plateau which represents pain with over a screw head inserted at the time of her knee replacement.  I had a long discussion with her regarding specifically removing the screw as an outpatient rather than continue the injections and she needs to think about it.  Unfortunately her insurance is out of network.  It did not appear to be any abnormality on the x-ray compared to films that were performed today versus those in May 2021.  The other areas of discomfort is over the anterior lateral tibia.  The skin was not red.  The knee was not warm there was no effusion.  There must be a very localized reaction to either some methacrylate or bursa formation.  I did inject the abdomen and the other option would be to explore that at the same time as the surgery to remove the screw.  She will let me know  Follow-Up Instructions: Return if symptoms worsen or fail to improve.   Orders:  Orders Placed This Encounter  Procedures   XR KNEE 3 VIEW LEFT   No orders of the defined types were placed in this encounter.     Procedures: Large Joint Inj: L knee on 09/25/2021 3:41 PM Indications: pain and diagnostic evaluation Details: 25 G 1.5 in needle, anteromedial approach  Arthrogram: No  Medications: 2 mL lidocaine 1 %; 2 mL bupivacaine 0.25 %; 20 mg methylPREDNISolone acetate 40 MG/ML  Injection performed over point tenderness anterior lateral tibial plateau Procedure, treatment alternatives, risks and benefits explained, specific risks  discussed. Consent was given by the patient. Patient was prepped and draped in the usual sterile fashion.      Clinical Data: No additional findings.   Subjective: Chief Complaint  Patient presents with   Left Knee - Pain  Patient presents today for recurrent left knee pain. She feels like her patella is more medial that it should be. She also fell August 29th and fractured her right wrist. She is currently in a cast. She also has pinky pain and was given a splint to wear, but is not currently wearing it today.   HPI  Review of Systems   Objective: Vital Signs: LMP 01/23/2015 Comment: perimenopausal (very irregular), no chance pregnant  Physical Exam Constitutional:      Appearance: She is well-developed.  Eyes:     Pupils: Pupils are equal, round, and reactive to light.  Pulmonary:     Effort: Pulmonary effort is normal.  Skin:    General: Skin is warm and dry.  Neurological:     Mental Status: She is alert and oriented to person, place, and time.  Psychiatric:        Behavior: Behavior normal.    Ortho Exam left knee was not hot warm or swollen.  Full quick extension of flex well over 105 degrees.  No  opening with a varus valgus stress and a negative anterior drawer sign.  No effusion.  Same 2 areas of tenderness over the anterior lateral proximal tibia below the joint line and over the screw head that was not palpable in the medial proximal posterior tibial plateau.  No hip pain.  Straight leg raise negative.  No distal edema.  Neurologically intact  Specialty Comments:  No specialty comments available.  Imaging: XR KNEE 3 VIEW LEFT  Result Date: 09/25/2021 Films of the left knee were obtained in 3 projections standing and compared to films that were performed in May 2021.  There does not appear to be any change.  There is a screw in the posterior medial tibial plateau from inserted at the time of the knee replacement surgery but no evidence of loosening.  Localized  areas of tenderness over the anterior lateral proximal tibia also without any obvious changes.  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 No pertinent family history.  Past Surgical History:  Procedure Laterality Date   AUGMENTATION MAMMAPLASTY Bilateral    placed 14 years ago    CHONDROPLASTY Left 06/29/2015   Procedure: CHONDROPLASTY;  Surgeon: Garald Balding, MD;  Location: [facility];  Service: Orthopedics;  Laterality: Left;   FOOT SURGERY Bilateral    Bone Spurs   KNEE ARTHROSCOPY WITH DRILLING/MICROFRACTURE Left 06/29/2015   Procedure: KNEE ARTHROSCOPY WITH DRILLING/MICROFRACTURE;  Surgeon: Garald Balding, MD;  Location: [facility];  Service: Orthopedics;  Laterality: Left;   KNEE ARTHROSCOPY WITH LATERAL MENISECTOMY Left 06/29/2015   Procedure: KNEE ARTHROSCOPY WITH LATERAL MENISECTOMY;  Surgeon: Garald Balding, MD;  Location: [facility];  Service: Orthopedics;  Laterality: Left;   KNEE ARTHROSCOPY WITH LATERAL RELEASE Left 06/29/2015   Procedure: KNEE ARTHROSCOPY WITH LATERAL RELEASE;  Surgeon: Garald Balding, MD;  Location: [facility];  Service: Orthopedics;  Laterality: Left;   KNEE SURGERY     Mohls Left    face   PLANTAR FASCIA RELEASE Right 09/27/2014   Procedure: PLANTAR  FASCIA RELEASE;  Surgeon: Garald Balding, MD;  Location: [facility];  Service: Orthopedics;  Laterality: Right;   TONSILLECTOMY     TOTAL KNEE ARTHROPLASTY Left 01/21/2017   Procedure: TOTAL KNEE ARTHROPLASTY;  Surgeon: Garald Balding, MD;  Location: [facility];  Service: Orthopedics;  Laterality: Left;   Tummy tuck     2010   Social History   Occupational History   Not on file  Tobacco Use   Smoking status: Never   Smokeless tobacco: Never  Vaping Use   Vaping Use: Never used  Substance and Sexual Activity   Alcohol use: No   Drug use: No   Sexual activity: Not Currently

## 2021-11-19 ENCOUNTER — Telehealth: Payer: Self-pay | Admitting: Orthopaedic Surgery

## 2021-11-19 NOTE — Telephone Encounter (Signed)
Pt called dur to a private matter.  CB [phone]

## 2021-11-19 NOTE — Telephone Encounter (Signed)
Spoke with patient. She states that she is ready to have the screw removed in her left knee. She said that it getting progressively worse and now limping. She is unable to exercise and therefore gaining weight. She realizes Dr.Whitfield it out of network but wants to proceed regardless. She said that she would like this done as soon as possible and before the end of this year. She is unsure if she needs to come back in, but would like to just schedule over the phone. Call back number is below. Thanks!

## 2021-11-20 NOTE — Telephone Encounter (Signed)
Spoke with patient. Let her know that [name] [name] would be in touch to get her scheduled and go over more details.

## 2021-11-20 NOTE — Telephone Encounter (Signed)
Ok to schedule removal of screw medial tibial plateau left knee-local and IV sedation

## 2021-11-21 ENCOUNTER — Telehealth: Payer: Self-pay | Admitting: Orthopaedic Surgery

## 2021-11-21 NOTE — Telephone Encounter (Signed)
Pt called asking for a CB from [facility]; she states Lauren will know what it's about.   [phone]

## 2021-11-21 NOTE — Telephone Encounter (Signed)
Spoke with patient. She is wanting to schedule surgery ASAP and would love to have it next week if possible. Please call her when you can. Thanks!

## 2021-11-22 NOTE — Telephone Encounter (Signed)
Pt called back wanting to make sure we didn't forget to call her and schedule her surgery.   [phone]

## 2021-11-28 ENCOUNTER — Telehealth: Payer: Self-pay | Admitting: Orthopaedic Surgery

## 2021-11-28 NOTE — Telephone Encounter (Signed)
Pt called stating she is having surgery tomorrow and had a couple of questions and would like a CB from [facility].   [phone]

## 2021-11-28 NOTE — Telephone Encounter (Signed)
Spoke with patient. Dr.Whitfield will take her handicap placard with him tomorrow to her surgery. She also had questions about anesthesia. Dr.Whitfield has ordered IV sedation and local.

## 2021-11-29 ENCOUNTER — Other Ambulatory Visit: Payer: Self-pay | Admitting: Orthopedic Surgery

## 2021-11-29 ENCOUNTER — Other Ambulatory Visit: Payer: Self-pay | Admitting: Orthopaedic Surgery

## 2021-11-29 DIAGNOSIS — T84127A Displacement of internal fixation device of bone of left lower leg, initial encounter: Secondary | ICD-10-CM | POA: Diagnosis not present

## 2021-11-29 MED ORDER — OXYCODONE-ACETAMINOPHEN 5-325 MG PO TABS: 1.0000 | ORAL_TABLET | Freq: Four times a day (QID) | ORAL | 0 refills | Status: DC | PRN

## 2021-12-03 ENCOUNTER — Telehealth: Payer: Self-pay | Admitting: Orthopaedic Surgery

## 2021-12-03 NOTE — Telephone Encounter (Signed)
Pt calling to speak with Lauren in Dr. Rudene Anda office. Pt is a post op pt but would not tell me the question she had, only wanted to speak with Lauren. The best call back number is [phone].

## 2021-12-03 NOTE — Telephone Encounter (Signed)
Spoke with patient. She said that her daughter Alexa that accompanied her to the surgery does not remember anything about post op care or the procedure that was done. Please call patient to go over instructions. Thanks!

## 2021-12-03 NOTE — Telephone Encounter (Signed)
Have [name] [name] call with instructions as outlined-thanks

## 2021-12-05 ENCOUNTER — Telehealth: Payer: Self-pay

## 2021-12-05 NOTE — Telephone Encounter (Signed)
Pt called and and wanted to know if she can be worked in sooner than 3 tomorrow.  She said if you can call her today if she can

## 2021-12-05 NOTE — Telephone Encounter (Signed)
Spoke with patient. She will be here tomorrow around 1:45pm.

## 2021-12-06 ENCOUNTER — Ambulatory Visit (INDEPENDENT_AMBULATORY_CARE_PROVIDER_SITE_OTHER): Payer: BLUE CROSS/BLUE SHIELD | Admitting: Orthopaedic Surgery

## 2021-12-06 ENCOUNTER — Other Ambulatory Visit: Payer: Self-pay

## 2021-12-06 ENCOUNTER — Encounter: Payer: Self-pay | Admitting: Orthopaedic Surgery

## 2021-12-06 DIAGNOSIS — M25562 Pain in left knee: Secondary | ICD-10-CM

## 2021-12-06 DIAGNOSIS — G8929 Other chronic pain: Secondary | ICD-10-CM

## 2021-12-06 MED ORDER — OXYCODONE-ACETAMINOPHEN 5-325 MG PO TABS: 1.0000 | ORAL_TABLET | Freq: Four times a day (QID) | ORAL | 0 refills | Status: DC | PRN

## 2021-12-06 NOTE — Progress Notes (Signed)
Office Visit Note   Patient: [name] [name]           Date of Birth: [birth year]           MRN: [id number] Visit Date: 12/06/2021              Requested by: Frazier Butt., MD [address] [place],  [place] [address] PCP: Frazier Butt., MD  Chief Complaint  Patient presents with   Left Knee - Routine Post Op      HPI: Patient is 1 week status post removal of screw from her left knee.  She admits she has probably been "overdoing it "she has been compliant with keeping the area clean and dry.  She denies fever, chills calf pain  Assessment & Plan: Visit Diagnoses: No diagnosis found.  Plan: 1 week status post removal of hardware doing well.  Procedure was reviewed with her and Dr. Durward Fortes.  She may get the area wet.  Incision looks quite good.  Steri-Strips were placed we gave her waterproof Band-Aids  Follow-Up Instructions: No follow-ups on file.   Ortho Exam  Patient is alert, oriented, no adenopathy, well-dressed, normal affect, normal respiratory effort. Examination demonstrates no areas of cellulitis redness erythema.  Compartments of the lower leg are soft and nontender.  Steri-Strips were removed.  She has well apposed wound edges with just a spot of blood drainage.  No evidence of infection  Imaging: No results found. No images are attached to the encounter.  Labs: Lab Results  Component Value Date   REPTSTATUS 01/22/2017 FINAL 01/21/2017   CULT NO GROWTH 01/21/2017     Lab Results  Component Value Date   ALBUMIN 4.0 01/21/2017   ALBUMIN 3.8 01/27/2014   ALBUMIN 3.7 03/23/2013    No results found for: MG No results found for: VD25OH  No results found for: PREALBUMIN CBC EXTENDED Latest Ref Rng & Units 01/24/2017 01/23/2017 01/22/2017  WBC 4.0 - 10.5 K/uL 8.9 8.3 6.9  RBC 3.87 - 5.11 MIL/uL 2.41(L) 2.77(L) 2.66(L)  HGB 12.0 - 15.0 g/dL 8.1(L) 9.2(L) 8.7(L)  HCT 36.0 - 46.0 % 23.4(L) 27.0(L) 26.1(L)  PLT 150 - 400 K/uL 189 205 180  NEUTROABS 1.7  - 7.7 K/uL - - -  LYMPHSABS 0.7 - 4.0 K/uL - - -     There is no height or weight on file to calculate BMI.  Orders:  No orders of the defined types were placed in this encounter.  No orders of the defined types were placed in this encounter.    Procedures: No procedures performed  Clinical Data: No additional findings.  ROS:  All other systems negative, except as noted in the HPI. Review of Systems  Objective: Vital Signs: LMP 01/23/2015 Comment: perimenopausal (very irregular), no chance pregnant  Specialty Comments:  No specialty comments available.  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 No pertinent family history.  Past Surgical History:  Procedure Laterality Date   AUGMENTATION MAMMAPLASTY Bilateral    placed 14 years ago    CHONDROPLASTY Left 06/29/2015   Procedure: CHONDROPLASTY;  Surgeon: Garald Balding, MD;  Location: [facility];  Service: Orthopedics;  Laterality: Left;   FOOT SURGERY Bilateral    Bone Spurs   KNEE ARTHROSCOPY WITH DRILLING/MICROFRACTURE Left 06/29/2015   Procedure: KNEE ARTHROSCOPY WITH DRILLING/MICROFRACTURE;  Surgeon: Garald Balding, MD;  Location: [facility];  Service: Orthopedics;  Laterality: Left;   KNEE ARTHROSCOPY WITH LATERAL MENISECTOMY Left 06/29/2015   Procedure: KNEE ARTHROSCOPY WITH LATERAL MENISECTOMY;  Surgeon:  Garald Balding, MD;  Location: [facility];  Service: Orthopedics;  Laterality: Left;   KNEE ARTHROSCOPY WITH LATERAL RELEASE Left 06/29/2015   Procedure: KNEE ARTHROSCOPY WITH LATERAL RELEASE;  Surgeon: Garald Balding, MD;  Location: [facility];  Service: Orthopedics;  Laterality: Left;   KNEE SURGERY     Mohls Left    face   PLANTAR FASCIA RELEASE Right 09/27/2014   Procedure: PLANTAR FASCIA RELEASE;  Surgeon: Garald Balding, MD;  Location: [facility];  Service: Orthopedics;  Laterality: Right;   TONSILLECTOMY     TOTAL KNEE ARTHROPLASTY Left 01/21/2017   Procedure: TOTAL KNEE ARTHROPLASTY;  Surgeon: Garald Balding, MD;  Location: [facility];  Service: Orthopedics;  Laterality: Left;   Tummy tuck     2010   Social History   Occupational History   Not on file  Tobacco Use   Smoking status: Never   Smokeless tobacco: Never  Vaping Use   Vaping Use: Never used  Substance and Sexual Activity   Alcohol use: No   Drug use: No   Sexual activity: Not Currently

## 2021-12-06 NOTE — Addendum Note (Signed)
Addended by: Georgette Dover on: 12/06/2021 01:41 PM   Modules accepted: Orders

## 2021-12-10 ENCOUNTER — Telehealth: Payer: Self-pay | Admitting: Orthopaedic Surgery

## 2021-12-10 NOTE — Telephone Encounter (Signed)
Called patient. No answer. Left information on her voicemail.

## 2021-12-10 NOTE — Telephone Encounter (Signed)
Needs new steri strips. Needs to keep dry. I applied a water proof bandaid over the steri strips

## 2021-12-10 NOTE — Telephone Encounter (Signed)
Pt called and would like someone to call her about her incision. Wants to speak to lauren only. Didn't want to be sent to triage.

## 2021-12-20 ENCOUNTER — Encounter: Payer: Self-pay | Admitting: Orthopaedic Surgery

## 2021-12-20 ENCOUNTER — Ambulatory Visit (INDEPENDENT_AMBULATORY_CARE_PROVIDER_SITE_OTHER): Payer: BLUE CROSS/BLUE SHIELD | Admitting: Orthopaedic Surgery

## 2021-12-20 ENCOUNTER — Other Ambulatory Visit: Payer: Self-pay

## 2021-12-20 DIAGNOSIS — M1712 Unilateral primary osteoarthritis, left knee: Secondary | ICD-10-CM

## 2021-12-20 NOTE — Progress Notes (Signed)
Office Visit Note   Patient: [name] [name]           Date of Birth: 30-Oct-1966           MRN: [id number] Visit Date: 12/20/2021              Requested by: Frazier Butt., MD [address] [place],  [place] [address] PCP: Frazier Butt., MD  Chief Complaint  Patient presents with   Left Knee - Pain      HPI: Patient is 3 weeks status post removal of hardware from her left knee.  She is overall doing well.  Her only complaint is of some sensitivity right around the incision.  Denies fever or chills.  Denies any paresthesia or numbness   Assessment & Plan: Visit Diagnoses: No diagnosis found.  Plan: 3 weeks status post above she is overall doing well findings consistent with some scar tissue absolutely no signs of infection.  Patient may return to activities over the course of the next 2 weeks as tolerated.  She could follow-up for an injection into her right knee in February  Follow-Up Instructions: No follow-ups on file.   Ortho Exam  Patient is alert, oriented, no adenopathy, well-dressed, normal affect, normal respiratory effort. Examination of her left incision is completely healed well apposed wound edges no drainage no surrounding erythema or cellulitis she does have some firmness immediately adjacent to the incision consistent with some scarring.  Imaging: No results found. No images are attached to the encounter.  Labs: Lab Results  Component Value Date   REPTSTATUS 01/22/2017 FINAL 01/21/2017   CULT NO GROWTH 01/21/2017     Lab Results  Component Value Date   ALBUMIN 4.0 01/21/2017   ALBUMIN 3.8 01/27/2014   ALBUMIN 3.7 03/23/2013    No results found for: MG No results found for: VD25OH  No results found for: PREALBUMIN CBC EXTENDED Latest Ref Rng & Units 01/24/2017 01/23/2017 01/22/2017  WBC 4.0 - 10.5 K/uL 8.9 8.3 6.9  RBC 3.87 - 5.11 MIL/uL 2.41(L) 2.77(L) 2.66(L)  HGB 12.0 - 15.0 g/dL 8.1(L) 9.2(L) 8.7(L)  HCT 36.0 - 46.0 % 23.4(L) 27.0(L)  26.1(L)  PLT 150 - 400 K/uL 189 205 180  NEUTROABS 1.7 - 7.7 K/uL - - -  LYMPHSABS 0.7 - 4.0 K/uL - - -     There is no height or weight on file to calculate BMI.  Orders:  No orders of the defined types were placed in this encounter.  No orders of the defined types were placed in this encounter.    Procedures: No procedures performed  Clinical Data: No additional findings.  ROS:  All other systems negative, except as noted in the HPI. Review of Systems  Objective: Vital Signs: LMP 01/23/2015 Comment: perimenopausal (very irregular), no chance pregnant  Specialty Comments:  No specialty comments available.  PMFS History: Patient Active Problem List   Diagnosis Date Noted   Right foot pain 02/25/2019   Unilateral primary osteoarthritis, left knee 01/21/2017   Chondromalacia patellae, left knee 01/02/2017   Torn meniscus 06/29/2015   Osteochondral defect of femoral condyle 06/29/2015   Plantar fasciitis of right foot 09/27/2014   Past Medical History:  Diagnosis Date   Anxiety    Arthritis    Bell's palsy 2016   left side    Cancer ([facility])    skin cancer- left temporal " sitting on bone"   Depression    Hypercholesteremia    PONV (postoperative nausea  and vomiting)    would like scope patch    History reviewed. No pertinent family history.  Past Surgical History:  Procedure Laterality Date   AUGMENTATION MAMMAPLASTY Bilateral    placed 14 years ago    CHONDROPLASTY Left 06/29/2015   Procedure: CHONDROPLASTY;  Surgeon: Garald Balding, MD;  Location: [facility];  Service: Orthopedics;  Laterality: Left;   FOOT SURGERY Bilateral    Bone Spurs   KNEE ARTHROSCOPY WITH DRILLING/MICROFRACTURE Left 06/29/2015   Procedure: KNEE ARTHROSCOPY WITH DRILLING/MICROFRACTURE;  Surgeon: Garald Balding, MD;  Location: [facility];  Service: Orthopedics;  Laterality: Left;   KNEE ARTHROSCOPY WITH LATERAL MENISECTOMY Left 06/29/2015   Procedure:  KNEE ARTHROSCOPY WITH LATERAL MENISECTOMY;  Surgeon: Garald Balding, MD;  Location: [facility];  Service: Orthopedics;  Laterality: Left;   KNEE ARTHROSCOPY WITH LATERAL RELEASE Left 06/29/2015   Procedure: KNEE ARTHROSCOPY WITH LATERAL RELEASE;  Surgeon: Garald Balding, MD;  Location: [facility];  Service: Orthopedics;  Laterality: Left;   KNEE SURGERY     Mohls Left    face   PLANTAR FASCIA RELEASE Right 09/27/2014   Procedure: PLANTAR FASCIA RELEASE;  Surgeon: Garald Balding, MD;  Location: [facility];  Service: Orthopedics;  Laterality: Right;   TONSILLECTOMY     TOTAL KNEE ARTHROPLASTY Left 01/21/2017   Procedure: TOTAL KNEE ARTHROPLASTY;  Surgeon: Garald Balding, MD;  Location: [facility];  Service: Orthopedics;  Laterality: Left;   Tummy tuck     2010   Social History   Occupational History   Not on file  Tobacco Use   Smoking status: Never   Smokeless tobacco: Never  Vaping Use   Vaping Use: Never used  Substance and Sexual Activity   Alcohol use: No   Drug use: No   Sexual activity: Not Currently

## 2022-01-24 ENCOUNTER — Telehealth: Payer: Self-pay | Admitting: Orthopaedic Surgery

## 2022-01-24 NOTE — Telephone Encounter (Signed)
Called and spoke with [name] [name]. She wanted to scheduled an appt for her daughter.

## 2022-01-24 NOTE — Telephone Encounter (Signed)
Patient called. She would like Lauren to call her. Did not give a reason. [phone]

## 2022-01-31 ENCOUNTER — Ambulatory Visit: Payer: BLUE CROSS/BLUE SHIELD | Admitting: Orthopaedic Surgery

## 2022-01-31 ENCOUNTER — Encounter: Payer: Self-pay | Admitting: Orthopaedic Surgery

## 2022-01-31 ENCOUNTER — Other Ambulatory Visit: Payer: Self-pay

## 2022-01-31 VITALS — BP 98/67

## 2022-01-31 DIAGNOSIS — M1712 Unilateral primary osteoarthritis, left knee: Secondary | ICD-10-CM

## 2022-01-31 MED ORDER — BUPIVACAINE HCL 0.25 % IJ SOLN
2.0000 mL | INTRAMUSCULAR | Status: AC | PRN
  Administered 2022-01-31: 2 mL via INTRA_ARTICULAR

## 2022-01-31 MED ORDER — LIDOCAINE HCL 1 % IJ SOLN
2.0000 mL | INTRAMUSCULAR | Status: AC | PRN
  Administered 2022-01-31: 2 mL

## 2022-01-31 MED ORDER — METHYLPREDNISOLONE ACETATE 40 MG/ML IJ SUSP
40.0000 mg | INTRAMUSCULAR | Status: AC | PRN
  Administered 2022-01-31: 40 mg via INTRA_ARTICULAR

## 2022-01-31 NOTE — Progress Notes (Signed)
Office Visit Note   Patient: [name] [name]           Date of Birth: August 20, 1966           MRN: [id number] Visit Date: 01/31/2022              Requested by: Frazier Butt., MD [address] [place],  [place] [address] PCP: Frazier Butt., MD   Assessment & Plan: Visit Diagnoses:  1. Unilateral primary osteoarthritis, left knee     Plan: [name] [name] is many years status post primary left total knee replacement and she has had recurrent localized areas of pain along the anterior lateral knee and anterior medial aspect of her knee.  We thought that the problem medially was the retained screw that was inserted as she had a small crack in the medial tibial plateau.  That was removed several months ago and she is done well from that pain but now has a bit of pain more anteriorly.  I have injected both those areas of trigger point tenderness and they are both extra-articular.  Her knee is not hot red or warm skin excellent range of motion and no instability and no effusion.  I do not think the problem is intra-articular but I really think to be worth having her get involved in an exercise program she has gained some weight but she needs to be working on nonweightbearing exercises which have outlined in detail plan to see her back.  We have had an extensive evaluation in the past for any obvious complications and the did not appear to be but we can always revisit that  Follow-Up Instructions: Return if symptoms worsen or fail to improve.   Orders:  No orders of the defined types were placed in this encounter.  No orders of the defined types were placed in this encounter.     Procedures: Large Joint Inj: L knee on 01/31/2022 1:10 PM Indications: pain and diagnostic evaluation Details: 25 G 1.5 in needle, anteromedial approach  Arthrogram: No  Medications: 2 mL lidocaine 1 %; 40 mg methylPREDNISolone acetate 40 MG/ML; 2 mL bupivacaine 0.25 % Procedure, treatment alternatives, risks and  benefits explained, specific risks discussed. Consent was given by the patient. Patient was prepped and draped in the usual sterile fashion.      Clinical Data: No additional findings.   Subjective: Chief Complaint  Patient presents with   Left Knee - Pain  Patient presents today for left knee injection.  HPI  Review of Systems   Objective: Vital Signs: BP 98/67    LMP 01/23/2015 Comment: perimenopausal (very irregular), no chance pregnant  Physical Exam Constitutional:      Appearance: She is well-developed.  Eyes:     Pupils: Pupils are equal, round, and reactive to light.  Pulmonary:     Effort: Pulmonary effort is normal.  Skin:    General: Skin is warm and dry.  Neurological:     Mental Status: She is alert and oriented to person, place, and time.  Psychiatric:        Behavior: Behavior normal.    Ortho Exam left knee was not hot warm or red.  All of her incisions of healed without problem.  There was no knee effusion.  Full quick extension.  No patella pain.  No instability.  There were 2 localized areas of tenderness along the anterior lateral and anterior medial aspect of the proximal tibia.  These were injected with Depo-Medrol.  There  was some gritty tissue almost as if it was scar at some point it may be worth even exploring those but I did like her to get involved in an exercise program as outlined above  Specialty Comments:  No specialty comments available.  Imaging: No results found.   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 No pertinent family history.  Past Surgical History:  Procedure Laterality Date   AUGMENTATION MAMMAPLASTY Bilateral    placed 14 years ago    CHONDROPLASTY Left 06/29/2015   Procedure: CHONDROPLASTY;  Surgeon: Garald Balding, MD;  Location: [facility];  Service: Orthopedics;  Laterality: Left;   FOOT SURGERY Bilateral    Bone Spurs   KNEE ARTHROSCOPY WITH DRILLING/MICROFRACTURE Left 06/29/2015   Procedure: KNEE ARTHROSCOPY WITH DRILLING/MICROFRACTURE;  Surgeon: Garald Balding, MD;  Location: [facility];  Service: Orthopedics;  Laterality: Left;   KNEE ARTHROSCOPY WITH LATERAL MENISECTOMY Left 06/29/2015   Procedure: KNEE ARTHROSCOPY WITH LATERAL MENISECTOMY;  Surgeon: Garald Balding, MD;  Location: [facility];  Service: Orthopedics;  Laterality: Left;   KNEE ARTHROSCOPY WITH LATERAL RELEASE Left 06/29/2015   Procedure: KNEE ARTHROSCOPY WITH LATERAL RELEASE;  Surgeon: Garald Balding, MD;  Location: [facility];  Service: Orthopedics;  Laterality: Left;   KNEE SURGERY     Mohls Left    face   PLANTAR FASCIA RELEASE Right 09/27/2014   Procedure: PLANTAR FASCIA RELEASE;  Surgeon: Garald Balding, MD;  Location: [facility];  Service: Orthopedics;  Laterality: Right;   TONSILLECTOMY     TOTAL KNEE ARTHROPLASTY Left 01/21/2017   Procedure: TOTAL KNEE ARTHROPLASTY;  Surgeon: Garald Balding, MD;  Location: [facility];  Service: Orthopedics;  Laterality: Left;   Tummy tuck     2010   Social History   Occupational History   Not on file  Tobacco Use   Smoking status: Never   Smokeless tobacco: Never  Vaping Use   Vaping Use: Never used  Substance and Sexual Activity   Alcohol use: No   Drug use: No   Sexual activity: Not  Currently

## 2022-03-06 ENCOUNTER — Telehealth: Payer: Self-pay | Admitting: Orthopaedic Surgery

## 2022-03-06 NOTE — Telephone Encounter (Signed)
Called and spoke with patient. Reassured her that Dr.Whitfield reviewed her pictures and message. He feels it is just a bruise and will resolve in time.  ?

## 2022-03-06 NOTE — Telephone Encounter (Signed)
Called patient. She has a quarter size black area at the area of the previous injection given 01/31/2022 in her knee. She said that it appeared a few days after the injection. It is tender to touch. She said that she is not currently on any antiiflammatories or blood thinners. She is using a topical chemo on her face but states that it is not related. Injection was given 5 weeks ago. She is going to send a picture of her knee to my work [employer].  ?

## 2022-03-06 NOTE — Telephone Encounter (Signed)
Pt called regarding last appt. Would like someone to call her.  ? ?CB [phone]  ?

## 2022-07-04 ENCOUNTER — Other Ambulatory Visit: Payer: Self-pay

## 2022-07-05 ENCOUNTER — Other Ambulatory Visit: Payer: Self-pay

## 2022-07-05 MED ORDER — WEGOVY 0.5 MG/0.5ML ~~LOC~~ SOAJ
0.5000 mL | SUBCUTANEOUS | 0 refills | Status: DC
  Filled 2022-07-05 (×2): qty 2, 28d supply, fill #0

## 2022-07-22 ENCOUNTER — Other Ambulatory Visit: Payer: Self-pay

## 2022-07-23 ENCOUNTER — Other Ambulatory Visit: Payer: Self-pay

## 2022-07-23 ENCOUNTER — Other Ambulatory Visit (HOSPITAL_COMMUNITY): Payer: Self-pay

## 2022-07-23 MED ORDER — WEGOVY 1 MG/0.5ML ~~LOC~~ SOAJ
SUBCUTANEOUS | 0 refills | Status: DC
  Filled 2022-07-23 – 2022-07-31 (×2): qty 2, 28d supply, fill #0

## 2022-07-24 ENCOUNTER — Other Ambulatory Visit: Payer: Self-pay

## 2022-07-26 ENCOUNTER — Other Ambulatory Visit: Payer: Self-pay

## 2022-07-29 ENCOUNTER — Other Ambulatory Visit: Payer: Self-pay

## 2022-07-30 ENCOUNTER — Other Ambulatory Visit: Payer: Self-pay

## 2022-07-31 ENCOUNTER — Other Ambulatory Visit: Payer: Self-pay

## 2022-08-02 ENCOUNTER — Other Ambulatory Visit: Payer: Self-pay

## 2022-08-02 MED ORDER — WEGOVY 0.5 MG/0.5ML ~~LOC~~ SOAJ
SUBCUTANEOUS | 0 refills | Status: DC
  Filled 2022-08-06: qty 2, 28d supply, fill #0

## 2022-08-06 ENCOUNTER — Other Ambulatory Visit: Payer: Self-pay

## 2022-08-08 ENCOUNTER — Other Ambulatory Visit: Payer: Self-pay

## 2022-08-20 ENCOUNTER — Other Ambulatory Visit: Payer: Self-pay

## 2022-08-21 ENCOUNTER — Other Ambulatory Visit: Payer: Self-pay

## 2022-08-21 MED ORDER — WEGOVY 0.5 MG/0.5ML ~~LOC~~ SOAJ
SUBCUTANEOUS | 0 refills | Status: DC
  Filled 2022-08-21: qty 2, 28d supply, fill #0

## 2022-08-21 MED ORDER — WEGOVY 1 MG/0.5ML ~~LOC~~ SOAJ
SUBCUTANEOUS | 0 refills | Status: DC
  Filled 2022-08-21: qty 2, 28d supply, fill #0

## 2022-08-27 ENCOUNTER — Telehealth: Payer: Self-pay | Admitting: Orthopaedic Surgery

## 2022-08-27 NOTE — Telephone Encounter (Signed)
Patient called asked if she can be worked into Dr. Rudene Anda schedule for an injection in her left knee during her lunch break which is 11:00 am for a quick injection. Patient asked if she can get a text with the time and day she can come in. Patient said she is limping and need injection as soon as possible. The number to contact patient is [phone]

## 2022-08-27 NOTE — Telephone Encounter (Signed)
Contacted patient to give her availability times for Dr. Durward Fortes, however had to leave a voicemail.

## 2022-08-27 NOTE — Telephone Encounter (Signed)
Patient called. Returning a call to [facility]

## 2022-08-28 NOTE — Telephone Encounter (Signed)
Patient has been contacted and appointment for her left knee has been made.

## 2022-08-29 ENCOUNTER — Encounter: Payer: Self-pay | Admitting: Orthopaedic Surgery

## 2022-08-29 ENCOUNTER — Ambulatory Visit: Payer: BLUE CROSS/BLUE SHIELD | Admitting: Orthopaedic Surgery

## 2022-08-29 DIAGNOSIS — M1712 Unilateral primary osteoarthritis, left knee: Secondary | ICD-10-CM | POA: Diagnosis not present

## 2022-08-29 DIAGNOSIS — G8929 Other chronic pain: Secondary | ICD-10-CM | POA: Diagnosis not present

## 2022-08-29 DIAGNOSIS — M25562 Pain in left knee: Secondary | ICD-10-CM | POA: Diagnosis not present

## 2022-08-29 DIAGNOSIS — M25511 Pain in right shoulder: Secondary | ICD-10-CM

## 2022-08-29 MED ORDER — BUPIVACAINE HCL 0.25 % IJ SOLN
2.0000 mL | INTRAMUSCULAR | Status: AC | PRN
  Administered 2022-08-29: 2 mL via INTRA_ARTICULAR

## 2022-08-29 MED ORDER — LIDOCAINE HCL 2 % IJ SOLN
2.0000 mL | INTRAMUSCULAR | Status: AC | PRN
  Administered 2022-08-29: 2 mL

## 2022-08-29 MED ORDER — METHYLPREDNISOLONE ACETATE 40 MG/ML IJ SUSP
80.0000 mg | INTRAMUSCULAR | Status: AC | PRN
  Administered 2022-08-29: 80 mg via INTRA_ARTICULAR

## 2022-08-29 NOTE — Progress Notes (Signed)
Office Visit Note   Patient: [name] [name]           Date of Birth: February 23, 1966           MRN: [id number] Visit Date: 08/29/2022              Requested by: Frazier Butt., MD [address] [place],  [place] [address] PCP: Frazier Butt., MD   Assessment & Plan: Visit Diagnoses:  1. Unilateral primary osteoarthritis, left knee   2. Chronic right shoulder pain   3. Chronic pain of left knee     Plan: [name] [name] visited the office for follow-up evaluation of a localized tenderness about the anterior lateral aspect of her left total knee replacement.  She has had recurrent pain in that same area that responded to cortisone injections.  Prior diagnostic work-up was negative for any complications.  Her knee has not been hot warm or red or even if used.  This but no instability.  I will reinject that 1 spot with cortisone today.  She also has been experiencing right shoulder pain.  She had an MRI scan performed through [facility] in November that demonstrated moderate anterior supraspinatus tendinopathy and mild biceps tendinopathy.  She had possible posterior superior labral tear that was nondisplaced.  She had mild AC joint arthropathy and very mild glenohumeral degenerative arthropathy.  There was a trace subacromial subdeltoid bursitis.  She has had 2 prior injections performed in [facility] with temporary relief of her pain.  She now is an employee of [employer] and would like to have further evaluation.  She certainly has evidence of impingement.  Her pain does not appear based on exam today to originate from the glenohumeral joint but I have injected the subacromial space with Depo-Medrol Xylocaine and Marcaine and hope from a diagnostic and therapeutic standpoint that it makes a difference if not I like to see her back  Follow-Up Instructions: Return if symptoms worsen or fail to improve.   Orders:  No orders of the defined types were placed in this encounter.  No orders of the defined  types were placed in this encounter.     Procedures: Large Joint Inj: R subacromial bursa on 08/29/2022 5:05 PM Indications: pain and diagnostic evaluation Details: 25 G 1.5 in needle, anterolateral approach  Arthrogram: No  Medications: 2 mL lidocaine 2 %; 80 mg methylPREDNISolone acetate 40 MG/ML; 2 mL bupivacaine 0.25 % Consent was given by the patient. Immediately prior to procedure a time out was called to verify the correct patient, procedure, equipment, support staff and site/side marked as required. Patient was prepped and draped in the usual sterile fashion.       Clinical Data: No additional findings.   Subjective: Chief Complaint  Patient presents with   Right Shoulder - Follow-up   Left Knee - Follow-up   Patient presents today for follow up of her left knee and has had a new issue with her right shoulder. She states that with her left knee she last had injection on 01/31/2022 and would like to have repeat injection. She states that with her right shoulder she has had a MRI of this shoulder and was informed that she had a tear. States that she has had injection on 06/2022 and feels like this has made her pain worse.  Review of Systems   Objective: Vital Signs: LMP 01/23/2015 Comment: perimenopausal (very irregular), no chance pregnant  Physical Exam Constitutional:      Appearance:  She is well-developed.  Eyes:     Pupils: Pupils are equal, round, and reactive to light.  Pulmonary:     Effort: Pulmonary effort is normal.  Skin:    General: Skin is warm and dry.  Neurological:     Mental Status: She is alert and oriented to person, place, and time.  Psychiatric:        Behavior: Behavior normal.     Ortho Exam right shoulder with positive impingement but tickly in the extreme of external rotation there is no crepitation and some tenderness along the anterior lateral subacromial region.  No pain at the [facility] joint.  No particular pain along the glenohumeral  joint with internal/external rotation or crepitation.  Skin intact.  Biceps intact.  Good strength.  Minimally positive empty can testing good grip and relieves.  Left knee with same localized tenderness with some soft tissue swelling locally directly over the anterior lateral aspect of the knee.  No effusion at the knee was not hot warm or red and no evidence of instability  Specialty Comments:  No specialty comments available.  Imaging: No results found.   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 No pertinent family history.  Past Surgical History:  Procedure Laterality Date   AUGMENTATION MAMMAPLASTY Bilateral    placed 14 years ago    CHONDROPLASTY Left 06/29/2015   Procedure: CHONDROPLASTY;  Surgeon: Garald Balding, MD;  Location: [facility];  Service: Orthopedics;  Laterality: Left;   FOOT SURGERY Bilateral    Bone Spurs   KNEE ARTHROSCOPY WITH DRILLING/MICROFRACTURE Left 06/29/2015   Procedure: KNEE ARTHROSCOPY WITH DRILLING/MICROFRACTURE;  Surgeon: Garald Balding, MD;  Location: [facility];  Service: Orthopedics;  Laterality: Left;   KNEE ARTHROSCOPY WITH LATERAL MENISECTOMY Left 06/29/2015   Procedure: KNEE ARTHROSCOPY WITH LATERAL MENISECTOMY;  Surgeon: Garald Balding, MD;  Location: [facility];  Service: Orthopedics;  Laterality: Left;   KNEE ARTHROSCOPY WITH LATERAL RELEASE Left 06/29/2015   Procedure: KNEE ARTHROSCOPY WITH LATERAL RELEASE;  Surgeon: Garald Balding, MD;  Location: [facility];  Service: Orthopedics;  Laterality: Left;   KNEE SURGERY     Mohls Left    face   PLANTAR FASCIA RELEASE Right 09/27/2014   Procedure: PLANTAR FASCIA RELEASE;  Surgeon: Garald Balding, MD;  Location: [facility];  Service: Orthopedics;  Laterality: Right;   TONSILLECTOMY     TOTAL KNEE ARTHROPLASTY Left 01/21/2017   Procedure: TOTAL KNEE ARTHROPLASTY;  Surgeon: Garald Balding, MD;  Location: [facility];  Service: Orthopedics;  Laterality: Left;   Tummy tuck     2010   Social History   Occupational History   Not on file  Tobacco  Use   Smoking status: Never   Smokeless tobacco: Never  Vaping Use   Vaping Use: Never used  Substance and Sexual Activity   Alcohol use: No   Drug use: No   Sexual activity: Not Currently

## 2022-08-30 ENCOUNTER — Telehealth: Payer: Self-pay | Admitting: Orthopaedic Surgery

## 2022-08-30 ENCOUNTER — Other Ambulatory Visit: Payer: Self-pay | Admitting: Physician Assistant

## 2022-08-30 MED ORDER — HYDROCODONE-ACETAMINOPHEN 5-325 MG PO TABS: 1.0000 | ORAL_TABLET | Freq: Three times a day (TID) | ORAL | 0 refills | Status: DC | PRN

## 2022-08-30 NOTE — Telephone Encounter (Signed)
Pt called requesting pain medication. [facility] send to pharmacy on file. Pt phone number is [phone]. Taking over the counter not touching pain.

## 2022-08-30 NOTE — Telephone Encounter (Signed)
Pt called requesting pain medication. [facility] send to pharmacy on file. Pt phone number is [phone]. Taking over the counter not touching pain.

## 2022-09-16 ENCOUNTER — Other Ambulatory Visit: Payer: Self-pay

## 2022-09-16 MED ORDER — WEGOVY 1 MG/0.5ML ~~LOC~~ SOAJ
0.5000 mL | SUBCUTANEOUS | 2 refills | Status: AC
  Filled 2022-09-16: qty 2, 28d supply, fill #0

## 2022-09-16 MED ORDER — ONDANSETRON 4 MG PO TBDP
4.0000 mg | ORAL_TABLET | Freq: Three times a day (TID) | ORAL | 1 refills | Status: AC | PRN
  Filled 2022-09-16: qty 10, 4d supply, fill #0

## 2022-09-17 ENCOUNTER — Other Ambulatory Visit: Payer: Self-pay

## 2022-10-04 ENCOUNTER — Other Ambulatory Visit: Payer: Self-pay

## 2022-10-04 MED ORDER — WEGOVY 1.7 MG/0.75ML ~~LOC~~ SOAJ
0.7500 mL | SUBCUTANEOUS | 0 refills | Status: DC
  Filled 2022-10-04: qty 3, 30d supply, fill #0
  Filled 2022-10-04: qty 3, 28d supply, fill #0

## 2022-10-07 ENCOUNTER — Other Ambulatory Visit: Payer: Self-pay

## 2022-10-09 ENCOUNTER — Other Ambulatory Visit: Payer: Self-pay

## 2022-10-28 ENCOUNTER — Other Ambulatory Visit: Payer: Self-pay

## 2022-10-28 MED ORDER — ONDANSETRON 4 MG PO TBDP
4.0000 mg | ORAL_TABLET | Freq: Three times a day (TID) | ORAL | 1 refills | Status: AC | PRN
  Filled 2022-10-28: qty 10, 4d supply, fill #0

## 2022-10-28 MED ORDER — WEGOVY 1.7 MG/0.75ML ~~LOC~~ SOAJ
1.7000 mg | SUBCUTANEOUS | 0 refills | Status: DC
  Filled 2022-10-28 (×2): qty 3, 28d supply, fill #0

## 2022-10-29 ENCOUNTER — Other Ambulatory Visit: Payer: Self-pay

## 2022-10-30 ENCOUNTER — Other Ambulatory Visit: Payer: Self-pay

## 2022-11-04 ENCOUNTER — Other Ambulatory Visit: Payer: Self-pay

## 2022-11-04 MED ORDER — WEGOVY 1.7 MG/0.75ML ~~LOC~~ SOAJ
0.7500 mL | SUBCUTANEOUS | 1 refills | Status: AC
  Filled 2022-11-18: qty 3, 30d supply, fill #0

## 2022-11-06 ENCOUNTER — Ambulatory Visit: Payer: BLUE CROSS/BLUE SHIELD | Admitting: Orthopaedic Surgery

## 2022-11-06 ENCOUNTER — Encounter: Payer: Self-pay | Admitting: Orthopaedic Surgery

## 2022-11-06 DIAGNOSIS — M25511 Pain in right shoulder: Secondary | ICD-10-CM | POA: Diagnosis not present

## 2022-11-06 DIAGNOSIS — G8929 Other chronic pain: Secondary | ICD-10-CM | POA: Diagnosis not present

## 2022-11-06 MED ORDER — METHYLPREDNISOLONE ACETATE 80 MG/ML IJ SUSP
80.0000 mg | INTRAMUSCULAR | Status: AC | PRN
  Administered 2022-11-06: 80 mg via INTRA_ARTICULAR

## 2022-11-06 MED ORDER — HYDROCODONE-ACETAMINOPHEN 5-325 MG PO TABS: 1.0000 | ORAL_TABLET | Freq: Two times a day (BID) | ORAL | 0 refills | Status: DC | PRN

## 2022-11-06 MED ORDER — BUPIVACAINE HCL 0.25 % IJ SOLN
2.0000 mL | INTRAMUSCULAR | Status: AC | PRN
  Administered 2022-11-06: 2 mL via INTRA_ARTICULAR

## 2022-11-06 MED ORDER — LIDOCAINE HCL 2 % IJ SOLN
2.0000 mL | INTRAMUSCULAR | Status: AC | PRN
  Administered 2022-11-06: 2 mL

## 2022-11-06 NOTE — Progress Notes (Signed)
Office Visit Note   Patient: [name] [name]           Date of Birth: June 29, 1966           MRN: [id number] Visit Date: 11/06/2022              Requested by: Frazier Butt., MD [address] [place],  [place] [address] PCP: Frazier Butt., MD   Assessment & Plan: Visit Diagnoses:  1. Chronic right shoulder pain     Plan: Sherleen is experiencing recurrent pain in her right shoulder.  He had an MRI scan of that same shoulder through [facility] a year ago that demonstrated moderate anterior supraspinatus tendinopathy and mild biceps tendinopathy.  There was a possible nondisplaced labral tear and AC joint arthropathy.  There was very mild glenohumeral degenerative arthropathy.  She had a cortisone injection performed through [facility] and she notes that it did make a difference for period time but now she has had recurrent pain to the point of compromise positive impingement and some difficulty with overhead motion.  Reinject the subacromial space with Depo-Medrol and order an MRI scan as I think she has a rotator cuff tear at this point.  She is planning to travel out of the country in the near future and just wanted to feel better, thus the reason for the cortisone injection.  We will also prescribe 20 Norco as she has had prior pain after injections  Follow-Up Instructions: Return after MRI right shoulder.   Orders:  Orders Placed This Encounter  Procedures   MR SHOULDER RIGHT WO CONTRAST   Meds ordered this encounter  Medications   HYDROcodone-acetaminophen (NORCO/VICODIN) 5-325 MG tablet    Sig: Take 1 tablet by mouth every 12 (twelve) hours as needed for moderate pain.    Dispense:  20 tablet    Refill:  0      Procedures: Large Joint Inj: R subacromial bursa on 11/06/2022 5:05 PM Indications: pain and diagnostic evaluation Details: 25 G 1.5 in needle, anterolateral approach  Arthrogram: No  Medications: 2 mL lidocaine 2 %; 2 mL bupivacaine 0.25 %; 80 mg  methylPREDNISolone acetate 80 MG/ML Consent was given by the patient. Immediately prior to procedure a time out was called to verify the correct patient, procedure, equipment, support staff and site/side marked as required. Patient was prepped and draped in the usual sterile fashion.       Clinical Data: No additional findings.   Subjective: Chief Complaint  Patient presents with   Right Shoulder - Follow-up   Left Knee - Follow-up  Patient presents today for a two month follow up on her right shoulder and left knee. She received cortisone injections into both at her last visit on 08/29/2022. She said that her right shoulder needs another injection. She is tender to the touch and has limited range of motion.   HPI  Review of Systems   Objective: Vital Signs: LMP 01/23/2015 Comment: perimenopausal (very irregular), no chance pregnant  Physical Exam Constitutional:      Appearance: She is well-developed.  Eyes:     Pupils: Pupils are equal, round, and reactive to light.  Pulmonary:     Effort: Pulmonary effort is normal.  Skin:    General: Skin is warm and dry.  Neurological:     Mental Status: She is alert and oriented to person, place, and time.  Psychiatric:        Behavior: Behavior normal.  Ortho Exam awake alert and oriented x3.  Comfortable sitting.  Positive impingement right shoulder and positive empty can testing.  Pain beneath the anterior subacromial region but not at the [facility] joint.  Biceps intact.  Negative speeds sign.  Able to place arm fully overhead but with a circuitous arc of motion.  Good grip and good release.  Skin intact  Specialty Comments:  No specialty comments available.  Imaging: No results found.   PMFS History: Patient Active Problem List   Diagnosis Date Noted   Pain in right shoulder 08/29/2022   Pain in left knee 05/18/2020   Right foot pain 02/25/2019   Unilateral primary osteoarthritis, left knee 01/21/2017   Chondromalacia  patellae, left knee 01/02/2017   Torn meniscus 06/29/2015   Osteochondral defect of femoral condyle 06/29/2015   Plantar fasciitis of right foot 09/27/2014   Past Medical History:  Diagnosis Date   Anxiety    Arthritis    Bell's palsy 2016   left side    Cancer (HCC)    skin cancer- left temporal " 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 No pertinent family history.  Past Surgical History:  Procedure Laterality Date   AUGMENTATION MAMMAPLASTY Bilateral    placed 14 years ago    CHONDROPLASTY Left 06/29/2015   Procedure: CHONDROPLASTY;  Surgeon: Garald Balding, MD;  Location: [place];  Service: Orthopedics;  Laterality: Left;   FOOT SURGERY Bilateral    Bone Spurs   KNEE ARTHROSCOPY WITH DRILLING/MICROFRACTURE Left 06/29/2015   Procedure: KNEE ARTHROSCOPY WITH DRILLING/MICROFRACTURE;  Surgeon: Garald Balding, MD;  Location: [place];  Service: Orthopedics;  Laterality: Left;   KNEE ARTHROSCOPY WITH LATERAL MENISECTOMY Left 06/29/2015   Procedure: KNEE ARTHROSCOPY WITH LATERAL MENISECTOMY;  Surgeon: Garald Balding, MD;  Location: [place];  Service: Orthopedics;  Laterality: Left;   KNEE ARTHROSCOPY WITH LATERAL RELEASE Left 06/29/2015   Procedure: KNEE ARTHROSCOPY WITH LATERAL RELEASE;  Surgeon: Garald Balding, MD;  Location: [place];  Service: Orthopedics;  Laterality: Left;   KNEE SURGERY     Mohls Left    face   PLANTAR FASCIA RELEASE Right 09/27/2014   Procedure: PLANTAR FASCIA RELEASE;  Surgeon: Garald Balding, MD;  Location: [place];  Service: Orthopedics;  Laterality: Right;   TONSILLECTOMY     TOTAL KNEE ARTHROPLASTY Left 01/21/2017   Procedure: TOTAL KNEE ARTHROPLASTY;  Surgeon: Garald Balding, MD;  Location: [place];  Service: Orthopedics;  Laterality: Left;   Tummy tuck      2010   Social History   Occupational History   Not on file  Tobacco Use   Smoking status: Never   Smokeless tobacco: Never  Vaping Use   Vaping Use: Never used  Substance and Sexual Activity   Alcohol use: No   Drug use: No   Sexual activity: Not Currently

## 2022-11-10 ENCOUNTER — Encounter: Payer: Self-pay | Admitting: Orthopaedic Surgery

## 2022-11-11 ENCOUNTER — Telehealth: Payer: Self-pay | Admitting: Orthopaedic Surgery

## 2022-11-11 NOTE — Telephone Encounter (Signed)
Pt called in request to get her results of her MRI... Pt stated that she will be leaving to go out the country... Pt requesting callback.Marland KitchenMarland Kitchen

## 2022-11-11 NOTE — Telephone Encounter (Signed)
Per our discussion-please call

## 2022-11-11 NOTE — Telephone Encounter (Signed)
Patient would like to get results  or MRI before she leaves tomorrow to go out to the country.  Best contact [phone]

## 2022-11-12 NOTE — Telephone Encounter (Signed)
thanks

## 2022-11-18 ENCOUNTER — Other Ambulatory Visit: Payer: Self-pay

## 2022-11-18 MED ORDER — WEGOVY 1.7 MG/0.75ML ~~LOC~~ SOAJ
1.7000 mg | SUBCUTANEOUS | 1 refills | Status: AC
  Filled 2022-11-18: qty 3, 28d supply, fill #0

## 2022-11-19 ENCOUNTER — Other Ambulatory Visit: Payer: Self-pay

## 2022-11-21 ENCOUNTER — Other Ambulatory Visit: Payer: Self-pay

## 2022-11-22 ENCOUNTER — Other Ambulatory Visit: Payer: Self-pay

## 2022-12-09 ENCOUNTER — Telehealth: Payer: Self-pay | Admitting: Orthopaedic Surgery

## 2022-12-09 NOTE — Telephone Encounter (Signed)
Pt called stating it is very important [name] [name] call her back she has an important question. Please call pt at [phone].

## 2022-12-10 NOTE — Telephone Encounter (Signed)
I called and patient said she had been taken care of already.

## 2022-12-11 ENCOUNTER — Encounter: Payer: Self-pay | Admitting: Orthopaedic Surgery

## 2022-12-11 ENCOUNTER — Ambulatory Visit (INDEPENDENT_AMBULATORY_CARE_PROVIDER_SITE_OTHER): Payer: BLUE CROSS/BLUE SHIELD

## 2022-12-11 ENCOUNTER — Ambulatory Visit: Payer: BLUE CROSS/BLUE SHIELD | Admitting: Orthopaedic Surgery

## 2022-12-11 DIAGNOSIS — M25511 Pain in right shoulder: Secondary | ICD-10-CM

## 2022-12-11 DIAGNOSIS — M25562 Pain in left knee: Secondary | ICD-10-CM

## 2022-12-11 DIAGNOSIS — M1712 Unilateral primary osteoarthritis, left knee: Secondary | ICD-10-CM

## 2022-12-11 MED ORDER — METHYLPREDNISOLONE ACETATE 40 MG/ML IJ SUSP
40.0000 mg | INTRAMUSCULAR | Status: AC | PRN
  Administered 2022-12-11: 40 mg via INTRA_ARTICULAR

## 2022-12-11 MED ORDER — LIDOCAINE HCL 1 % IJ SOLN
2.0000 mL | INTRAMUSCULAR | Status: AC | PRN
  Administered 2022-12-11: 2 mL

## 2022-12-11 MED ORDER — HYDROCODONE-ACETAMINOPHEN 5-325 MG PO TABS: 1.0000 | ORAL_TABLET | Freq: Four times a day (QID) | ORAL | 0 refills | Status: DC | PRN
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
## 2022-12-12 ENCOUNTER — Telehealth: Payer: Self-pay | Admitting: Orthopaedic Surgery

## 2022-12-12 NOTE — Telephone Encounter (Signed)
Reviewed office notes to clarify exam relative to new injuries

## 2022-12-12 NOTE — Telephone Encounter (Signed)
Pt called requesting another call back from Dr Durward Fortes. Pt did not leave a reason for call back. Pt only said she has another question. Please call pt at [phone].

## 2022-12-13 ENCOUNTER — Other Ambulatory Visit: Payer: Self-pay

## 2022-12-13 MED ORDER — WEGOVY 1.7 MG/0.75ML ~~LOC~~ SOAJ
1.7000 mg | SUBCUTANEOUS | 1 refills | Status: AC
  Filled 2023-06-30: qty 3, 28d supply, fill #0

## 2022-12-17 ENCOUNTER — Other Ambulatory Visit: Payer: Self-pay

## 2022-12-17 MED ORDER — WEGOVY 2.4 MG/0.75ML ~~LOC~~ SOAJ: SUBCUTANEOUS | 0 refills | Status: AC

## 2022-12-17 MED ORDER — WEGOVY 2.4 MG/0.75ML ~~LOC~~ SOAJ
2.4000 mg | SUBCUTANEOUS | 0 refills | Status: AC
  Filled 2022-12-17: qty 3, 28d supply, fill #0

## 2022-12-18 ENCOUNTER — Other Ambulatory Visit: Payer: Self-pay

## 2022-12-25 ENCOUNTER — Encounter: Payer: Self-pay | Admitting: Orthopaedic Surgery

## 2022-12-25 ENCOUNTER — Ambulatory Visit: Payer: BLUE CROSS/BLUE SHIELD | Admitting: Orthopaedic Surgery

## 2022-12-25 DIAGNOSIS — G8929 Other chronic pain: Secondary | ICD-10-CM | POA: Diagnosis not present

## 2022-12-25 DIAGNOSIS — M25562 Pain in left knee: Secondary | ICD-10-CM

## 2022-12-25 DIAGNOSIS — M25511 Pain in right shoulder: Secondary | ICD-10-CM

## 2022-12-25 DIAGNOSIS — M1712 Unilateral primary osteoarthritis, left knee: Secondary | ICD-10-CM | POA: Diagnosis not present

## 2022-12-25 MED ORDER — BUPIVACAINE HCL 0.25 % IJ SOLN
2.0000 mL | INTRAMUSCULAR | Status: AC | PRN
  Administered 2022-12-25: 2 mL via INTRA_ARTICULAR

## 2022-12-25 MED ORDER — LIDOCAINE HCL 2 % IJ SOLN
2.0000 mL | INTRAMUSCULAR | Status: AC | PRN
  Administered 2022-12-25: 2 mL

## 2022-12-25 MED ORDER — METHYLPREDNISOLONE ACETATE 40 MG/ML IJ SUSP
80.0000 mg | INTRAMUSCULAR | Status: AC | PRN
  Administered 2022-12-25: 80 mg via INTRA_ARTICULAR
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
## 2023-01-10 ENCOUNTER — Other Ambulatory Visit: Payer: Self-pay

## 2023-01-10 MED ORDER — WEGOVY 2.4 MG/0.75ML ~~LOC~~ SOAJ
SUBCUTANEOUS | 0 refills | Status: AC
  Filled 2023-01-10: qty 3, 28d supply, fill #0

## 2023-01-16 ENCOUNTER — Other Ambulatory Visit: Payer: Self-pay

## 2023-01-27 ENCOUNTER — Other Ambulatory Visit: Payer: Self-pay

## 2023-01-27 MED ORDER — WEGOVY 2.4 MG/0.75ML ~~LOC~~ SOAJ
2.4000 mg | SUBCUTANEOUS | 0 refills | Status: AC
  Filled 2023-02-10: qty 3, 28d supply, fill #0

## 2023-02-10 ENCOUNTER — Other Ambulatory Visit: Payer: Self-pay

## 2023-02-10 MED ORDER — WEGOVY 2.4 MG/0.75ML ~~LOC~~ SOAJ
2.4000 mg | SUBCUTANEOUS | 0 refills | Status: AC
  Filled 2023-02-10 – 2023-03-09 (×2): qty 3, 28d supply, fill #0

## 2023-02-14 ENCOUNTER — Other Ambulatory Visit: Payer: Self-pay

## 2023-02-24 ENCOUNTER — Telehealth: Payer: Self-pay | Admitting: Physician Assistant

## 2023-02-24 NOTE — Telephone Encounter (Signed)
Pt called requesting a call from PA Persons. Pt has medical questions. Please call pt at [phone].

## 2023-02-24 NOTE — Telephone Encounter (Signed)
FYI  Per our discussion, patient is scheduled to see Lurena Joiner on 03/03/2023.  I advised they did not have to use the ultrasound for the injection.

## 2023-02-24 NOTE — Telephone Encounter (Signed)
I called patient. She states that needs a shoulder injection. Dr. Durward Fortes told her she needed to see Dr. Marlou Sa for her shoulder. She is now having extreme shoulder pain and is going out of town next week and needs to get in and get this injection. I advised I could work her in with [facility] next week, however, she is concerned that she may have the ultrasound charge and she feels her medical bills are going up. She states Dr. Durward Fortes never had to use ultrasound.   Will discuss with Lauren F and call patient back to schedule.

## 2023-02-24 NOTE — Telephone Encounter (Signed)
Would you please call her and ask what questions she has? Thankl

## 2023-02-26 ENCOUNTER — Telehealth: Payer: Self-pay | Admitting: Physician Assistant

## 2023-02-26 NOTE — Telephone Encounter (Signed)
Called patient. She just wanted some encouraging about her upcoming appointment. She is nervous about seeing a new provider. I explained that she is in GREAT hands, and Lurena Joiner is very trained and capable of taking her of her shoulder needs.

## 2023-02-26 NOTE — Telephone Encounter (Signed)
Pt called requesting a call back from PA Person. Pt states it is really important. Pt phone number is [phone].

## 2023-03-03 ENCOUNTER — Ambulatory Visit: Payer: Self-pay

## 2023-03-03 ENCOUNTER — Ambulatory Visit (INDEPENDENT_AMBULATORY_CARE_PROVIDER_SITE_OTHER): Payer: BLUE CROSS/BLUE SHIELD | Admitting: Surgical

## 2023-03-03 ENCOUNTER — Ambulatory Visit (INDEPENDENT_AMBULATORY_CARE_PROVIDER_SITE_OTHER): Payer: BLUE CROSS/BLUE SHIELD

## 2023-03-03 DIAGNOSIS — G8929 Other chronic pain: Secondary | ICD-10-CM

## 2023-03-03 DIAGNOSIS — M25511 Pain in right shoulder: Secondary | ICD-10-CM | POA: Diagnosis not present

## 2023-03-03 DIAGNOSIS — M25411 Effusion, right shoulder: Secondary | ICD-10-CM

## 2023-03-03 MED ORDER — HYDROCODONE-ACETAMINOPHEN 5-325 MG PO TABS: 1.0000 | ORAL_TABLET | Freq: Two times a day (BID) | ORAL | 0 refills | Status: DC | PRN

## 2023-03-04 ENCOUNTER — Encounter: Payer: Self-pay | Admitting: Surgical

## 2023-03-04 NOTE — Progress Notes (Signed)
Office Visit Note   Patient: [name] [name]           Date of Birth: 1966-01-13           MRN: [id number] Visit Date: 03/03/2023 Requested by: Frazier Butt., MD [address] [place],  [place] [address] PCP: Frazier Butt., MD  Subjective: Chief Complaint  Patient presents with   Other    Shoulder pain-scheduled for u/s guided injection    HPI: [name] [name] is a 57 y.o. female who presents to the office reporting right shoulder pain stemming from a fall 2 years ago in which she broke her wrist.  She developed shoulder pain several months later and had MRI scan demonstrating small SLAP tear.  She initially saw another provider followed by Dr. Durward Fortes who has been administering shoulder injections for her with good relief for several months at a time.  She primarily localizes pain to the posterolateral aspect of the right shoulder with accompanying scapular pain and pain that radiates down into the wrist at times.  She also describes a popping and clicking sensation as well as a new "crunching" sensation in the right shoulder.  Symptoms have worsened recently without any history of injury but has gotten to the point where it brings her to tears and she rates pain 10 out of 10.  Takes Zofran at times due to how nauseous she gets from the pain she is in.  She denies any fevers or chills.  She denies any history of prior surgery to the shoulder.  Recently had a breast biopsy and states that even though this was a painful procedure, her shoulder bothers her more..  Her last MRI was a noncontrast MRI of the right shoulder in November 2023 that demonstrated moderate supraspinatus tendinosis with mild infra, subscap, long head of the bicep tendinosis as well as some fraying of the posterior superior labrum and mild degenerative changes of the glenohumeral joint.              ROS: All systems reviewed are negative as they relate to the chief complaint within the history of present illness.  Patient  denies fevers or chills.  Assessment & Plan: Visit Diagnoses:  1. Chronic right shoulder pain     Plan: Patient is a 57 year old female who presents for evaluation of right shoulder pain.  She has chronic shoulder pain over the last 2 years with multiple MRI scans demonstrating no rotator cuff tear.  She does have some labral pathology that seems fairly mild but her 2 MRI scans have been without contrast.  Discussed options available to patient and she would like to try shoulder injection as she is leaving for [facility] in 2 days to visit her twin daughters.  Ultrasound probe was applied to the shoulder demonstrating significant subacromial subdeltoid bursitis with joint effusion of the glenohumeral joint with hypoechoic fluid surrounding the bicep tendon in the bicep tendon sheath.  Bicep tendon is intact with no defects.  Subscapularis tendon appears intact with no full-thickness tear demonstrated.  No full-thickness tear identified of the supraspinatus or infraspinatus tendons.  Glenohumeral injection was administered under ultrasound guidance and patient tolerated the procedure well.  Also obtained cervical spine radiographs on her way out given the scapular and radicular pain she has into the wrist at times.  Seems a lot of her pain localizes to the shoulder primarily so we will plan on ordering MRI arthrogram of the right shoulder.  Follow-Up Instructions: No follow-ups on  file.   Orders:  Orders Placed This Encounter  Procedures   US Guided Needle Placement - No Linked Charges   XR Cervical Spine 2 or 3 views   Meds ordered this encounter  Medications   HYDROcodone-acetaminophen (NORCO/VICODIN) 5-325 MG tablet    Sig: Take 1 tablet by mouth every 12 (twelve) hours as needed for moderate pain.    Dispense:  20 tablet    Refill:  0      Procedures: Large Joint Inj: R glenohumeral on 03/03/2023 11:03 AM Indications: diagnostic evaluation and pain Details: 22 G 1.5 in and 3.5 in needle,  ultrasound-guided posterior approach  Arthrogram: No  Medications: 9 mL bupivacaine 0.5 %; 40 mg methylPREDNISolone acetate 40 MG/ML; 5 mL lidocaine 1 % Outcome: tolerated well, no immediate complications Procedure, treatment alternatives, risks and benefits explained, specific risks discussed. Consent was given by the patient. Immediately prior to procedure a time out was called to verify the correct patient, procedure, equipment, support staff and site/side marked as required. Patient was prepped and draped in the usual sterile fashion.       Clinical Data: No additional findings.  Objective: Vital Signs: LMP 01/23/2015 Comment: perimenopausal (very irregular), no chance pregnant  Physical Exam:  Constitutional: Patient appears well-developed HEENT:  Head: Normocephalic Eyes:EOM are normal Neck: Normal range of motion Cardiovascular: Normal rate Pulmonary/chest: Effort normal Neurologic: Patient is alert Skin: Skin is warm Psychiatric: Patient has normal mood and affect  Ortho Exam: Ortho exam demonstrates right shoulder with 90 degrees X rotation, 110 degrees abduction, 175 degrees forward flexion.  This compared with the left shoulder with 80 degrees external rotation, 110 degrees abduction, 175 degrees forward elevation.  She has excellent strength of supra and subscap that is rated 5/5 though this does reproduce pain for her.  She has 5 -/5 infraspinatus strength of the right shoulder relative to 5/5 in the left.  She has severe tenderness over the long head of the bicep tendon in the bicipital groove.  Positive O'Brien sign.  Positive Neer sign.  Negative external rotation lag sign.  Negative Hornblower sign.  She has some clicking and crepitus noted in the anterior aspect of the shoulder.  She has 5/5 strength of bilateral grip strength, finger abduction, pronation/supination, bicep, tricep, deltoid.  Negative Spurling sign.  Negative Lhermitte sign.  Specialty Comments:  No  specialty comments available.  Imaging: No results found.   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  Past Surgical History:  Procedure Laterality Date   AUGMENTATION MAMMAPLASTY Bilateral    placed 14 years ago    CHONDROPLASTY Left 06/29/2015   Procedure: CHONDROPLASTY;  Surgeon: Garald Balding, MD;  Location: [facility];  Service: Orthopedics;  Laterality:  Left;   FOOT SURGERY Bilateral    Bone Spurs   KNEE ARTHROSCOPY WITH DRILLING/MICROFRACTURE Left 06/29/2015   Procedure: KNEE ARTHROSCOPY WITH DRILLING/MICROFRACTURE;  Surgeon: Garald Balding, MD;  Location: [facility];  Service: Orthopedics;  Laterality: Left;   KNEE ARTHROSCOPY WITH LATERAL MENISECTOMY Left 06/29/2015   Procedure: KNEE ARTHROSCOPY WITH LATERAL MENISECTOMY;  Surgeon: Garald Balding, MD;  Location: [facility];  Service: Orthopedics;  Laterality: Left;   KNEE ARTHROSCOPY WITH LATERAL RELEASE Left 06/29/2015   Procedure: KNEE ARTHROSCOPY WITH LATERAL RELEASE;  Surgeon: Garald Balding, MD;  Location: [facility];  Service: Orthopedics;  Laterality: Left;   KNEE SURGERY     Mohls Left    face   PLANTAR FASCIA RELEASE Right 09/27/2014   Procedure: PLANTAR FASCIA RELEASE;  Surgeon: Garald Balding, MD;   Location: [facility];  Service: Orthopedics;  Laterality: Right;   TONSILLECTOMY     TOTAL KNEE ARTHROPLASTY Left 01/21/2017   Procedure: TOTAL KNEE ARTHROPLASTY;  Surgeon: Garald Balding, MD;  Location: [facility];  Service: Orthopedics;  Laterality: Left;   Tummy tuck     2010   Social History   Occupational History   Not on file  Tobacco Use   Smoking status: Never   Smokeless tobacco: Never  Vaping Use   Vaping Use: Never used  Substance and Sexual Activity   Alcohol use: No   Drug use: No   Sexual activity: Not Currently

## 2023-03-10 ENCOUNTER — Other Ambulatory Visit: Payer: Self-pay

## 2023-03-11 ENCOUNTER — Other Ambulatory Visit: Payer: Self-pay

## 2023-03-11 MED ORDER — WEGOVY 2.4 MG/0.75ML ~~LOC~~ SOAJ
2.4000 mg | SUBCUTANEOUS | 1 refills | Status: DC
  Filled 2023-03-11: qty 3, 28d supply, fill #0

## 2023-03-12 ENCOUNTER — Other Ambulatory Visit: Payer: Self-pay

## 2023-03-12 NOTE — Telephone Encounter (Signed)
I called patient. She will come in to try SA injection tomorrow at 8 AM

## 2023-03-13 ENCOUNTER — Ambulatory Visit (INDEPENDENT_AMBULATORY_CARE_PROVIDER_SITE_OTHER): Payer: BLUE CROSS/BLUE SHIELD | Admitting: Surgical

## 2023-03-13 ENCOUNTER — Encounter: Payer: Self-pay | Admitting: Surgical

## 2023-03-13 DIAGNOSIS — M25411 Effusion, right shoulder: Secondary | ICD-10-CM

## 2023-03-13 DIAGNOSIS — M25511 Pain in right shoulder: Secondary | ICD-10-CM

## 2023-03-13 DIAGNOSIS — G8929 Other chronic pain: Secondary | ICD-10-CM

## 2023-03-13 DIAGNOSIS — M7541 Impingement syndrome of right shoulder: Secondary | ICD-10-CM | POA: Diagnosis not present

## 2023-03-13 MED ORDER — METHYLPREDNISOLONE ACETATE 40 MG/ML IJ SUSP
40.0000 mg | INTRAMUSCULAR | Status: AC | PRN
  Administered 2023-03-03: 40 mg via INTRA_ARTICULAR

## 2023-03-13 MED ORDER — LIDOCAINE HCL 1 % IJ SOLN
5.0000 mL | INTRAMUSCULAR | Status: AC | PRN
  Administered 2023-03-13: 5 mL

## 2023-03-13 MED ORDER — BUPIVACAINE HCL 0.5 % IJ SOLN
9.0000 mL | INTRAMUSCULAR | Status: AC | PRN
  Administered 2023-03-03: 9 mL via INTRA_ARTICULAR

## 2023-03-13 MED ORDER — METHYLPREDNISOLONE ACETATE 40 MG/ML IJ SUSP
40.0000 mg | INTRAMUSCULAR | Status: AC | PRN
  Administered 2023-03-13: 40 mg via INTRA_ARTICULAR

## 2023-03-13 MED ORDER — BUPIVACAINE HCL 0.5 % IJ SOLN
9.0000 mL | INTRAMUSCULAR | Status: AC | PRN
  Administered 2023-03-13: 9 mL via INTRA_ARTICULAR

## 2023-03-13 MED ORDER — LIDOCAINE HCL 1 % IJ SOLN
5.0000 mL | INTRAMUSCULAR | Status: AC | PRN
  Administered 2023-03-03: 5 mL

## 2023-03-13 NOTE — Progress Notes (Signed)
   Procedure Note  Patient: Preeya Koelbl             Date of Birth: [birth year]           MRN: [id number]             Visit Date: 03/13/2023  Procedures: Visit Diagnoses:  1. Effusion of glenohumeral joint of right upper extremity   2. Chronic right shoulder pain     Large Joint Inj: R subacromial bursa on 03/13/2023 9:04 AM Indications: diagnostic evaluation and pain Details: 18 G 1.5 in needle, posterior approach  Arthrogram: No  Medications: 9 mL bupivacaine 0.5 %; 40 mg methylPREDNISolone acetate 40 MG/ML; 5 mL lidocaine 1 % Outcome: tolerated well, no immediate complications  Patient returns following glenohumeral injection about 10 days ago.  She had about 1 day relief of symptoms from injection where she notes that she felt 65 to 70% better in regards to her right shoulder pain.  She has ultrasound examination today demonstrating significantly reduced glenohumeral effusion compared with prior exam from 10 days ago.  However, with her continued pain, plan to try right subacromial injection today.  Patient tolerated procedure well.  She has had subacromial injections in the past by Dr. Durward Fortes that have given her good relief.  In addition, with her significant improvement in symptoms from glenohumeral injection, though it was short-lived, plan to order MRI arthrogram for further evaluation of right shoulder pain.  She has had 2 prior MRIs without contrast that have not shown any significant pathology that can explain her fairly severe pain. Procedure, treatment alternatives, risks and benefits explained, specific risks discussed. Consent was given by the patient. Immediately prior to procedure a time out was called to verify the correct patient, procedure, equipment, support staff and site/side marked as required. Patient was prepped and draped in the usual sterile fashion.

## 2023-03-14 ENCOUNTER — Telehealth: Payer: Self-pay

## 2023-03-14 NOTE — Telephone Encounter (Signed)
Pt called and states that expedited order for MRI if right shoulder has not been received by [facility] and would like sent to this number [phone] so that she can sch appt.

## 2023-03-14 NOTE — Telephone Encounter (Signed)
called

## 2023-03-14 NOTE — Telephone Encounter (Signed)
Refaxed order to [phone]

## 2023-03-18 ENCOUNTER — Other Ambulatory Visit: Payer: Self-pay | Admitting: Obstetrics and Gynecology

## 2023-03-18 DIAGNOSIS — R921 Mammographic calcification found on diagnostic imaging of breast: Secondary | ICD-10-CM

## 2023-03-20 ENCOUNTER — Other Ambulatory Visit: Payer: Self-pay

## 2023-03-20 ENCOUNTER — Encounter: Payer: Self-pay | Admitting: Orthopaedic Surgery

## 2023-03-20 ENCOUNTER — Ambulatory Visit: Payer: BLUE CROSS/BLUE SHIELD | Admitting: Orthopaedic Surgery

## 2023-03-20 DIAGNOSIS — G8929 Other chronic pain: Secondary | ICD-10-CM

## 2023-03-20 DIAGNOSIS — M25562 Pain in left knee: Secondary | ICD-10-CM | POA: Diagnosis not present

## 2023-03-20 MED ORDER — METHYLPREDNISOLONE ACETATE 40 MG/ML IJ SUSP
40.0000 mg | INTRAMUSCULAR | Status: AC | PRN
  Administered 2023-03-20: 40 mg via INTRA_ARTICULAR

## 2023-03-20 MED ORDER — LIDOCAINE HCL 1 % IJ SOLN
3.0000 mL | INTRAMUSCULAR | Status: AC | PRN
  Administered 2023-03-20: 3 mL

## 2023-03-20 NOTE — Progress Notes (Signed)
The patient is a long-term patient of Dr. Durward Fortes who has since retired.  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  She did end up having a significant injury to her right knee with a cartilage deficit and had a cartilage transfer and bone graft procedure years ago.  She eventually had a total knee arthroplasty by Dr. Durward Fortes in 2018.  She continues to experience chronic pain with that left knee.  She comes in about every 3 to 4 months to have a steroid injection in the knee.  I did explain to her that in my experience I try to avoid multiple steroid injections in the total joint because of the degradation that I can eventually cause to the implants and the components and the infection risk as well.  On exam there is no significant effusion of her left total knee and her range of motion is good and there is no instability of the knee itself.  X-rays from December of her right knee show the implant still had good positioning with no complicating features.  Per her request I did place a steroid injection in her left knee today but I told her that I would not be comfortable doing this over and over again because of what I had mentioned before.  I would like to obtain a three-phase bone scan to rule out prosthetic loosening to try to truly get an idea of what the source of her pain is with that knee to see if this could ever be addressed or corrected.  She agrees with this treatment plan.  She did tolerate the steroid injection well today.     Procedure Note  Patient: [name] [name]             Date of Birth: Dec 21, 1966           MRN: [id number]             Visit Date: 03/20/2023  Procedures: Visit Diagnoses:  1. Chronic pain of left knee     Large Joint Inj: L knee on 03/20/2023 2:51 PM Indications: diagnostic evaluation and pain Details: 22 G 1.5 in needle, superolateral approach  Arthrogram: No  Medications: 3 mL lidocaine 1 %; 40 mg  methylPREDNISolone acetate 40 MG/ML Outcome: tolerated well, no immediate complications Procedure, treatment alternatives, risks and benefits explained, specific risks discussed. Consent was given by the patient. Immediately prior to procedure a time out was called to verify the correct patient, procedure, equipment, support staff and site/side marked as required. Patient was prepped and draped in the usual sterile fashion.

## 2023-03-21 ENCOUNTER — Ambulatory Visit
Admission: RE | Admit: 2023-03-21 | Discharge: 2023-03-21 | Disposition: A | Discharge status: Home / Self Care | Payer: BLUE CROSS/BLUE SHIELD | Source: Ambulatory Visit | Attending: Obstetrics and Gynecology | Admitting: Obstetrics and Gynecology

## 2023-03-21 DIAGNOSIS — R921 Mammographic calcification found on diagnostic imaging of breast: Secondary | ICD-10-CM

## 2023-04-09 NOTE — Telephone Encounter (Signed)
[name] [name], would you mind putting in a MRI with contrast (arthrogram) for the right shoulder for [name] [name]?  She needs it done at [facility] as her insurance wanted her to have it done at [facility] but they do not do arthrograms there because they do not have a provider.

## 2023-04-10 ENCOUNTER — Other Ambulatory Visit: Payer: Self-pay | Admitting: Surgical

## 2023-04-10 ENCOUNTER — Telehealth: Payer: Self-pay | Admitting: Surgical

## 2023-04-10 MED ORDER — HYDROCODONE-ACETAMINOPHEN 5-325 MG PO TABS: 1.0000 | ORAL_TABLET | Freq: Every evening | ORAL | 0 refills | Status: AC | PRN

## 2023-04-10 NOTE — Telephone Encounter (Signed)
Medication refill. Pain medication doesn't remember the name.

## 2023-04-11 ENCOUNTER — Other Ambulatory Visit: Payer: Self-pay

## 2023-04-11 ENCOUNTER — Telehealth: Payer: Self-pay | Admitting: Surgical

## 2023-04-11 MED ORDER — WEGOVY 1.7 MG/0.75ML ~~LOC~~ SOAJ
1.7000 mg | SUBCUTANEOUS | 0 refills | Status: AC
  Filled 2023-04-11: qty 3, 28d supply, fill #0

## 2023-04-11 NOTE — Telephone Encounter (Signed)
Patient asking to speak to [facility] , she states he will call him.

## 2023-04-14 ENCOUNTER — Other Ambulatory Visit: Payer: Self-pay

## 2023-04-17 ENCOUNTER — Telehealth: Payer: Self-pay | Admitting: Surgical

## 2023-04-17 NOTE — Telephone Encounter (Signed)
Good morning, can you guys help Antoinetta out?

## 2023-04-17 NOTE — Telephone Encounter (Signed)
Patient called stating there was something off on the order and could order her MRI and Arthogram again stated they told her the wording was off and they can't do it please advise

## 2023-04-18 ENCOUNTER — Telehealth: Payer: Self-pay | Admitting: Surgical

## 2023-04-18 NOTE — Telephone Encounter (Signed)
Patient has appt for imaging at [facility] on 05/09 and they need the order to say "Arthogram" specifically so they can hold her appt please advise patient will have MRI W/ And W/O contrast

## 2023-04-20 NOTE — Telephone Encounter (Signed)
I called [facility] and discussed with their MRI technician about her appointment and what she needs.  The person that I spoke to understands that she does not need any IV contrast and she just needs contrast via arthrogram prior to her MRI scan.  Please do not hesitate to let me know if there is any confusion or if I need to call anyone else for clarification

## 2023-04-21 NOTE — Telephone Encounter (Signed)
[name] [name] is taking care of this

## 2023-04-22 ENCOUNTER — Other Ambulatory Visit: Payer: Self-pay

## 2023-04-24 ENCOUNTER — Other Ambulatory Visit: Payer: Self-pay

## 2023-05-01 NOTE — Telephone Encounter (Signed)
Yes, he is going to do it tomorrow morning

## 2023-05-12 NOTE — Telephone Encounter (Signed)
[name] [name], I called [name].  I recommended that for her pain she has with her shoulder that is fairly intractable for her leading up to this surgery she has with Dr. Sanda Linger, to try lidocaine patches in addition to the Tylenol and ibuprofen that she is taking.  She would also like to have Ryan from [facility] give her a call to see if she can get set up for an ice machine?  Would you be able to let him know and give her contact info to him?  Thanks I appreciate it

## 2023-05-13 NOTE — Telephone Encounter (Signed)
Message sent to ryan about this

## 2023-05-15 ENCOUNTER — Other Ambulatory Visit: Payer: Self-pay

## 2023-05-15 MED ORDER — WEGOVY 1.7 MG/0.75ML ~~LOC~~ SOAJ
1.7000 mg | SUBCUTANEOUS | 0 refills | Status: AC
  Filled 2023-05-15 – 2023-05-20 (×2): qty 3, 28d supply, fill #0

## 2023-05-20 ENCOUNTER — Other Ambulatory Visit: Payer: Self-pay

## 2023-05-23 ENCOUNTER — Other Ambulatory Visit: Payer: Self-pay

## 2023-05-28 ENCOUNTER — Other Ambulatory Visit: Payer: Self-pay

## 2023-06-30 ENCOUNTER — Other Ambulatory Visit: Payer: Self-pay

## 2023-06-30 MED ORDER — WEGOVY 1.7 MG/0.75ML ~~LOC~~ SOAJ
0.7500 mL | SUBCUTANEOUS | 0 refills | Status: DC
  Filled 2023-06-30 – 2023-07-27 (×2): qty 3, 30d supply, fill #0

## 2023-07-03 NOTE — Telephone Encounter (Signed)
I called patient.  She just wanted to update me on how she is doing after her surgery at another facility.  She says she is overall recovering well and said thanks for the workup and sending her over.

## 2023-07-04 ENCOUNTER — Other Ambulatory Visit: Payer: Self-pay

## 2023-07-28 ENCOUNTER — Other Ambulatory Visit: Payer: Self-pay

## 2023-07-29 ENCOUNTER — Other Ambulatory Visit: Payer: Self-pay

## 2023-08-04 ENCOUNTER — Other Ambulatory Visit: Payer: Self-pay

## 2023-08-05 ENCOUNTER — Other Ambulatory Visit: Payer: Self-pay

## 2023-08-13 ENCOUNTER — Other Ambulatory Visit: Payer: Self-pay

## 2023-08-15 ENCOUNTER — Telehealth: Payer: Self-pay | Admitting: Surgical

## 2023-08-15 NOTE — Telephone Encounter (Signed)
I sent msg

## 2023-08-15 NOTE — Telephone Encounter (Signed)
Patient called. Would like a call back. Has not heard anything back from [facility] about her Mychart. Her cb# [phone]

## 2023-08-30 ENCOUNTER — Other Ambulatory Visit: Payer: Self-pay

## 2023-09-01 ENCOUNTER — Other Ambulatory Visit: Payer: Self-pay

## 2023-09-01 MED ORDER — SEMAGLUTIDE-WEIGHT MANAGEMENT 1.7 MG/0.75ML ~~LOC~~ SOAJ
0.7500 mL | SUBCUTANEOUS | 0 refills | Status: DC
  Filled 2023-09-01: qty 3, 30d supply, fill #0

## 2023-09-03 ENCOUNTER — Other Ambulatory Visit: Payer: Self-pay

## 2023-09-24 ENCOUNTER — Other Ambulatory Visit: Payer: Self-pay

## 2023-09-24 MED ORDER — WEGOVY 1.7 MG/0.75ML ~~LOC~~ SOAJ
1.7000 mg | SUBCUTANEOUS | 0 refills | Status: AC
  Filled 2023-09-24: qty 3, 28d supply, fill #0

## 2023-09-25 ENCOUNTER — Other Ambulatory Visit: Payer: Self-pay

## 2023-10-15 ENCOUNTER — Other Ambulatory Visit: Payer: Self-pay

## 2023-10-15 MED ORDER — WEGOVY 1.7 MG/0.75ML ~~LOC~~ SOAJ
0.7500 mL | SUBCUTANEOUS | 0 refills | Status: AC
  Filled 2023-10-15: qty 3, 28d supply, fill #0

## 2023-10-21 ENCOUNTER — Other Ambulatory Visit: Payer: Self-pay

## 2023-10-27 ENCOUNTER — Other Ambulatory Visit: Payer: Self-pay

## 2023-11-04 ENCOUNTER — Telehealth: Payer: Self-pay | Admitting: Orthopaedic Surgery

## 2023-11-04 NOTE — Telephone Encounter (Signed)
Spoke with patient and scheduled for [facility] as patient requested.

## 2023-11-04 NOTE — Telephone Encounter (Signed)
Patient called asked if [name] [name] would give her a call concerning her knee because Dr. Cleophas Dunker is no longer with the practice. Patient said her left knee is pretty bad.  The number to contact patient is [phone]

## 2023-11-17 ENCOUNTER — Other Ambulatory Visit: Payer: Self-pay

## 2023-11-17 ENCOUNTER — Ambulatory Visit: Payer: BLUE CROSS/BLUE SHIELD | Admitting: Surgical

## 2023-11-17 DIAGNOSIS — M25562 Pain in left knee: Secondary | ICD-10-CM

## 2023-11-17 DIAGNOSIS — M7052 Other bursitis of knee, left knee: Secondary | ICD-10-CM

## 2023-11-17 DIAGNOSIS — G8929 Other chronic pain: Secondary | ICD-10-CM

## 2023-11-17 MED ORDER — WEGOVY 1.7 MG/0.75ML ~~LOC~~ SOAJ
1.7000 mg | Freq: Every day | SUBCUTANEOUS | 0 refills | Status: DC
  Filled 2023-11-17: qty 3, 28d supply, fill #0

## 2023-11-18 ENCOUNTER — Other Ambulatory Visit: Payer: Self-pay

## 2023-11-19 ENCOUNTER — Encounter: Payer: Self-pay | Admitting: Surgical

## 2023-11-19 MED ORDER — BUPIVACAINE HCL 0.25 % IJ SOLN
1.0000 mL | INTRAMUSCULAR | Status: AC | PRN
  Administered 2023-11-17: 1 mL via INTRA_ARTICULAR

## 2023-11-19 MED ORDER — LIDOCAINE HCL 1 % IJ SOLN
5.0000 mL | INTRAMUSCULAR | Status: AC | PRN
  Administered 2023-11-17: 5 mL

## 2023-11-19 MED ORDER — METHYLPREDNISOLONE ACETATE 40 MG/ML IJ SUSP
20.0000 mg | INTRAMUSCULAR | Status: AC | PRN
  Administered 2023-11-17: 20 mg via INTRA_ARTICULAR

## 2023-11-19 NOTE — Progress Notes (Signed)
Office Visit Note   Patient: [name] [name]           Date of Birth: 07/04/1966           MRN: [id number] Visit Date: 11/17/2023 Requested by: Reynold Bowen., MD [address] [place],  [place] [address] PCP: Reynold Bowen., MD  Subjective: Chief Complaint  Patient presents with   Left Knee - Pain    HPI: [name] [name] is a 57 y.o. female who presents to the office reporting left knee pain.  Patient has history of left total knee arthroplasty in 2018 by Dr. Cleophas Dunker.  She is here today complaining of left knee pain.  She has history of left knee pain following her surgery with periodic injections by Dr. Cleophas Dunker extra articularly.  She describes anterolateral pain in the knee without fevers or chills.  No change in how her incision looks.  She is able to ambulate but she does have pain in this location isolated to the anterolateral knee with walking around.  Also bothers her with knee flexion.  Does have some mechanical symptoms in this area as well.  No groin pain, radicular pain, numbness/tingling.  No worsening of low back pain..                ROS: All systems reviewed are negative as they relate to the chief complaint within the history of present illness.  Patient denies fevers or chills.  Assessment & Plan: Visit Diagnoses:  1. Other bursitis of knee, left knee   2. Chronic pain of left knee     Plan: Patient is a 57 year old female who presents for evaluation of left knee pain.  Has history of left total knee arthroplasty by Dr. Cleophas Dunker in 2018.  Has done well after this procedure except for intermittent left knee pain in the anterior lateral aspect of the knee in the region of Gerdy's tubercle.  There is associated crepitus on exam today.  She has previous radiographs demonstrating no evidence of loosening or any significant change regarding her left knee.  We discussed options available to patient and she would like to repeat injection.  I am agreeable to this today as  this is an extra-articular injection but patient understands that there is still a small but not insignificant risk of prosthetic joint infection with cortisone injections around joint replacements.  She understands that joint replacement infections would likely require revision surgery and sometimes require multiple surgeries.  She would still like to proceed.  Under ultrasound guidance, iliotibial band identified and there is some subtle hypoechoic/isoechoic signal in the area between the IT band and Gerdy's tubercle consistent with IT bursitis.  Injection administered and patient tolerated procedure well without complication.  Patient was rechecked after the injection and she had 100% resolution of her symptoms while ambulating.  She had no knee effusion or even trace effusion after completion of the procedure.  Recommended she keep up with IT band stretching exercises to prevent recurrence.  Follow-up as needed.  Follow-Up Instructions: No follow-ups on file.   Orders:  Orders Placed This Encounter  Procedures   US Guided Needle Placement - No Linked Charges   No orders of the defined types were placed in this encounter.     Procedures: Iliotibial bursa injection: L knee on 11/17/2023 11:25 AM Indications: diagnostic evaluation, joint swelling and pain Details: 22 G 1.5 in needle, ultrasound-guided anterolateral approach  Arthrogram: No  Medications: 5 mL lidocaine 1 %; 1 mL bupivacaine  0.25 %; 20 mg methylPREDNISolone acetate 40 MG/ML Outcome: tolerated well, no immediate complications Procedure, treatment alternatives, risks and benefits explained, specific risks discussed. Consent was given by the patient. Immediately prior to procedure a time out was called to verify the correct patient, procedure, equipment, support staff and site/side marked as required. Patient was prepped and draped in the usual sterile fashion.       Clinical Data: No additional  findings.  Objective: Vital Signs: LMP 01/23/2015 Comment: perimenopausal (very irregular), no chance pregnant  Physical Exam:  Constitutional: Patient appears well-developed HEENT:  Head: Normocephalic Eyes:EOM are normal Neck: Normal range of motion Cardiovascular: Normal rate Pulmonary/chest: Effort normal Neurologic: Patient is alert Skin: Skin is warm Psychiatric: Patient has normal mood and affect  Ortho Exam: Ortho exam demonstrates left knee with no effusion.  Incision over the anterior aspect of the left knee is well-healed without evidence of infection or dehiscence.  No sinus tract.  She has no calf tenderness.  Negative Homans' sign.  No pain with hip range of motion.  Negative FADIR sign.  Negative Stinchfield sign.  Negative straight leg raise.  Intact hip flexion, quadricep, hamstring, dorsiflexion, plantarflexion rated 5/5.  She has no warmth abnormally over the left knee.  She does have crepitus in the anterolateral aspect over Gerdy's tubercle with passive motion of the left knee; there is associated tenderness in this region as well..  No tenderness significantly elsewhere in the knee.  Specialty Comments:  No specialty comments available.  Imaging: No results found.   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   Past Surgical History:  Procedure Laterality Date   AUGMENTATION MAMMAPLASTY Bilateral    placed 14 years ago    CHONDROPLASTY Left 06/29/2015   Procedure: CHONDROPLASTY;  Surgeon: Valeria Batman, MD;  Location: [facility];  Service: Orthopedics;  Laterality: Left;   FOOT SURGERY Bilateral    Bone Spurs   KNEE ARTHROSCOPY WITH DRILLING/MICROFRACTURE Left 06/29/2015   Procedure: KNEE ARTHROSCOPY WITH DRILLING/MICROFRACTURE;  Surgeon: Valeria Batman, MD;  Location: [facility];  Service: Orthopedics;  Laterality: Left;   KNEE ARTHROSCOPY WITH LATERAL MENISECTOMY Left 06/29/2015   Procedure: KNEE ARTHROSCOPY WITH LATERAL MENISECTOMY;  Surgeon: Valeria Batman, MD;  Location: [facility];  Service: Orthopedics;  Laterality: Left;   KNEE ARTHROSCOPY WITH LATERAL RELEASE Left 06/29/2015   Procedure: KNEE ARTHROSCOPY WITH LATERAL RELEASE;  Surgeon: Valeria Batman, MD;  Location: [facility];  Service: Orthopedics;  Laterality: Left;   KNEE SURGERY     [place] Left    face   PLANTAR FASCIA RELEASE Right 09/27/2014   Procedure: PLANTAR FASCIA RELEASE;  Surgeon: Valeria Batman, MD;  Location: [facility];  Service: Orthopedics;  Laterality: Right;   TONSILLECTOMY     TOTAL KNEE ARTHROPLASTY Left 01/21/2017   Procedure: TOTAL KNEE ARTHROPLASTY;  Surgeon: Valeria Batman, MD;  Location: [facility];  Service: Orthopedics;  Laterality: Left;   Tummy tuck     2010   Social History   Occupational History   Not on file  Tobacco Use   Smoking status: Never   Smokeless tobacco: Never  Vaping Use   Vaping status: Never Used  Substance and Sexual Activity   Alcohol use: No   Drug use: No   Sexual activity: Not Currently

## 2023-12-25 ENCOUNTER — Other Ambulatory Visit: Payer: Self-pay

## 2023-12-25 ENCOUNTER — Other Ambulatory Visit (HOSPITAL_BASED_OUTPATIENT_CLINIC_OR_DEPARTMENT_OTHER): Payer: Self-pay

## 2023-12-25 MED ORDER — WEGOVY 1.7 MG/0.75ML ~~LOC~~ SOAJ
1.7000 mg | SUBCUTANEOUS | 0 refills | Status: DC
  Filled 2023-12-25: qty 3, 28d supply, fill #0

## 2023-12-26 ENCOUNTER — Other Ambulatory Visit: Payer: Self-pay

## 2023-12-26 ENCOUNTER — Other Ambulatory Visit (HOSPITAL_BASED_OUTPATIENT_CLINIC_OR_DEPARTMENT_OTHER): Payer: Self-pay

## 2024-01-19 ENCOUNTER — Other Ambulatory Visit (HOSPITAL_BASED_OUTPATIENT_CLINIC_OR_DEPARTMENT_OTHER): Payer: Self-pay

## 2024-01-19 MED ORDER — SEMAGLUTIDE-WEIGHT MANAGEMENT 1.7 MG/0.75ML ~~LOC~~ SOAJ
1.7000 mg | SUBCUTANEOUS | 0 refills | Status: AC
  Filled 2024-01-19 – 2024-01-21 (×2): qty 3, 28d supply, fill #0

## 2024-01-21 ENCOUNTER — Other Ambulatory Visit: Payer: Self-pay

## 2024-01-21 ENCOUNTER — Other Ambulatory Visit (HOSPITAL_BASED_OUTPATIENT_CLINIC_OR_DEPARTMENT_OTHER): Payer: Self-pay

## 2024-01-22 ENCOUNTER — Other Ambulatory Visit: Payer: Self-pay

## 2024-01-30 ENCOUNTER — Other Ambulatory Visit: Payer: Self-pay

## 2024-10-25 ENCOUNTER — Encounter: Payer: Self-pay | Admitting: Radiology
